# Patient Record
Sex: Female | Born: 1940 | Race: White | Hispanic: No | Smoking: Never smoker
Health system: Southern US, Community
[De-identification: ages and names within clinical notes are randomized; demographics above are authoritative.]

## PROBLEM LIST (undated history)

## (undated) DIAGNOSIS — Z8719 Personal history of other diseases of the digestive system: Secondary | ICD-10-CM

## (undated) DIAGNOSIS — R51 Headache: Secondary | ICD-10-CM

## (undated) DIAGNOSIS — Z950 Presence of cardiac pacemaker: Secondary | ICD-10-CM

## (undated) DIAGNOSIS — I639 Cerebral infarction, unspecified: Secondary | ICD-10-CM

## (undated) DIAGNOSIS — I809 Phlebitis and thrombophlebitis of unspecified site: Secondary | ICD-10-CM

## (undated) DIAGNOSIS — R0602 Shortness of breath: Secondary | ICD-10-CM

## (undated) DIAGNOSIS — R519 Headache, unspecified: Secondary | ICD-10-CM

## (undated) DIAGNOSIS — I499 Cardiac arrhythmia, unspecified: Secondary | ICD-10-CM

## (undated) DIAGNOSIS — I10 Essential (primary) hypertension: Secondary | ICD-10-CM

## (undated) DIAGNOSIS — R609 Edema, unspecified: Secondary | ICD-10-CM

## (undated) DIAGNOSIS — K589 Irritable bowel syndrome without diarrhea: Secondary | ICD-10-CM

## (undated) DIAGNOSIS — I272 Pulmonary hypertension, unspecified: Secondary | ICD-10-CM

## (undated) DIAGNOSIS — E119 Type 2 diabetes mellitus without complications: Secondary | ICD-10-CM

## (undated) DIAGNOSIS — K219 Gastro-esophageal reflux disease without esophagitis: Secondary | ICD-10-CM

## (undated) DIAGNOSIS — E039 Hypothyroidism, unspecified: Secondary | ICD-10-CM

## (undated) DIAGNOSIS — M1990 Unspecified osteoarthritis, unspecified site: Secondary | ICD-10-CM

## (undated) HISTORY — PX: TONSILLECTOMY: SUR1361

## (undated) HISTORY — PX: KNEE ARTHROSCOPY: SUR90

## (undated) HISTORY — PX: INSERT / REPLACE / REMOVE PACEMAKER: SUR710

## (undated) HISTORY — PX: EYE SURGERY: SHX253

---

## 1975-01-17 HISTORY — PX: ABDOMINAL HYSTERECTOMY: SHX81

## 1981-01-16 HISTORY — PX: CHOLECYSTECTOMY: SHX55

## 2012-10-23 ENCOUNTER — Encounter (INDEPENDENT_AMBULATORY_CARE_PROVIDER_SITE_OTHER): Payer: Medicare Other | Admitting: Ophthalmology

## 2012-10-23 DIAGNOSIS — H2510 Age-related nuclear cataract, unspecified eye: Secondary | ICD-10-CM

## 2012-10-23 DIAGNOSIS — H43819 Vitreous degeneration, unspecified eye: Secondary | ICD-10-CM

## 2012-10-23 DIAGNOSIS — E11319 Type 2 diabetes mellitus with unspecified diabetic retinopathy without macular edema: Secondary | ICD-10-CM

## 2012-10-23 DIAGNOSIS — E1139 Type 2 diabetes mellitus with other diabetic ophthalmic complication: Secondary | ICD-10-CM

## 2012-10-23 DIAGNOSIS — H35039 Hypertensive retinopathy, unspecified eye: Secondary | ICD-10-CM

## 2012-10-23 DIAGNOSIS — I10 Essential (primary) hypertension: Secondary | ICD-10-CM

## 2012-10-23 DIAGNOSIS — H35349 Macular cyst, hole, or pseudohole, unspecified eye: Secondary | ICD-10-CM

## 2012-10-23 DIAGNOSIS — H35342 Macular cyst, hole, or pseudohole, left eye: Secondary | ICD-10-CM | POA: Diagnosis present

## 2012-10-23 NOTE — H&P (Signed)
[name] is an 72 y.o. female.   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  No past surgical history on file.  No family history on file. Social History:  has no tobacco, alcohol, and drug history on file.  Allergies: Allergies not on file  No prescriptions prior to admission    Review of systems otherwise negative  There were no vitals taken for this visit.  Physical exam: Mental status: oriented x3. Eyes: See eye exam associated with this date of surgery in media tab.  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

## 2012-11-04 ENCOUNTER — Encounter (HOSPITAL_COMMUNITY): Payer: Self-pay | Admitting: Pharmacist

## 2012-11-05 ENCOUNTER — Encounter (HOSPITAL_COMMUNITY): Payer: Self-pay

## 2012-11-05 ENCOUNTER — Encounter (HOSPITAL_COMMUNITY)
Admission: RE | Admit: 2012-11-05 | Discharge: 2012-11-05 | Disposition: A | Discharge status: Home / Self Care | Payer: Medicare Other | Source: Ambulatory Visit | Attending: Ophthalmology | Admitting: Ophthalmology

## 2012-11-05 ENCOUNTER — Ambulatory Visit (HOSPITAL_COMMUNITY)
Admission: RE | Admit: 2012-11-05 | Discharge: 2012-11-05 | Disposition: A | Discharge status: Home / Self Care | Payer: Medicare Other | Source: Ambulatory Visit | Attending: Ophthalmology | Admitting: Ophthalmology

## 2012-11-05 DIAGNOSIS — Z01812 Encounter for preprocedural laboratory examination: Secondary | ICD-10-CM | POA: Insufficient documentation

## 2012-11-05 DIAGNOSIS — Z01810 Encounter for preprocedural cardiovascular examination: Secondary | ICD-10-CM | POA: Insufficient documentation

## 2012-11-05 DIAGNOSIS — R9431 Abnormal electrocardiogram [ECG] [EKG]: Secondary | ICD-10-CM | POA: Insufficient documentation

## 2012-11-05 DIAGNOSIS — Z01818 Encounter for other preprocedural examination: Secondary | ICD-10-CM | POA: Insufficient documentation

## 2012-11-05 HISTORY — DX: Personal history of other diseases of the digestive system: Z87.19

## 2012-11-05 HISTORY — DX: Hypothyroidism, unspecified: E03.9

## 2012-11-05 HISTORY — DX: Essential (primary) hypertension: I10

## 2012-11-05 HISTORY — DX: Cerebral infarction, unspecified: I63.9

## 2012-11-05 HISTORY — DX: Presence of cardiac pacemaker: Z95.0

## 2012-11-05 HISTORY — DX: Unspecified osteoarthritis, unspecified site: M19.90

## 2012-11-05 HISTORY — DX: Cardiac arrhythmia, unspecified: I49.9

## 2012-11-05 HISTORY — DX: Gastro-esophageal reflux disease without esophagitis: K21.9

## 2012-11-05 HISTORY — DX: Type 2 diabetes mellitus without complications: E11.9

## 2012-11-05 LAB — BASIC METABOLIC PANEL
BUN: 19 mg/dL (ref 6–23)
CO2: 23 mEq/L (ref 19–32)
Calcium: 8.9 mg/dL (ref 8.4–10.5)
Chloride: 105 mEq/L (ref 96–112)
Creatinine, Ser: 0.65 mg/dL (ref 0.50–1.10)
GFR calc Af Amer: >90 mL/min (ref >90)
Potassium: 4.1 mEq/L (ref 3.5–5.1)

## 2012-11-05 LAB — CBC
Hemoglobin: 13 g/dL (ref 12.0–15.0)
MCV: 96.3 fL (ref 78.0–100.0)
Platelets: 215 10*3/uL (ref 150–400)
RDW: 14 % (ref 11.5–15.5)
WBC: 6.5 10*3/uL (ref 4.0–10.5)

## 2012-11-05 LAB — APTT: aPTT: 34 seconds (ref 24–37)

## 2012-11-05 NOTE — Progress Notes (Signed)
FAXED PPM ORDERS FOR DR. MUNLEY TO SIGN.  WAITING FOR OFFICE TO FAX BACK.  ALLISON TO REVIEW LABS

## 2012-11-05 NOTE — Pre-Procedure Instructions (Signed)
[name]  11/05/2012   Your procedure is scheduled on:   Tuesday  11/12/12  Report to [facility]  2 * 3 at  AS INSTRUCTED BY DR. MATTHEWS  Call this number if you have problems the morning of surgery: [phone]   Remember:   Do not eat food or drink liquids after midnight.   Take these medicines the morning of surgery with A SIP OF WATER:   TYLENOL, AMLODIPINE(NORVASC) , ESTRACE, GABAPENTIN, LEVOTHYROXINE, METOPROLOL(TOPROLXL), OMEPRAZOLE, POTASSIUM (STOP ASPIRIN AND COUMADIN AS DIRECTED)  Do not wear jewelry, make-up or nail polish.  Do not wear lotions, powders, or perfumes. You may wear deodorant.  Do not shave 48 hours prior to surgery. Men may shave face and neck.  Do not bring valuables to the hospital.  [facility] is not responsible                  for any belongings or valuables.               Contacts, dentures or bridgework may not be worn into surgery.  Leave suitcase in the car. After surgery it may be brought to your room.  For patients admitted to the hospital, discharge time is determined by your                treatment team.               Patients discharged the day of surgery will not be allowed to drive  home.  Name and phone number of your driver:   Special Instructions: Shower using CHG 2 nights before surgery and the night before surgery.  If you shower the day of surgery use CHG.  Use special wash - you have one bottle of CHG for all showers.  You should use approximately 1/3 of the bottle for each shower.    Please read over the following fact sheets that you were given: Pain Booklet, Coughing and Deep Breathing and Surgical Site Infection Prevention

## 2012-11-05 NOTE — Progress Notes (Signed)
11/05/12 1106  OBSTRUCTIVE SLEEP APNEA  Have you ever been diagnosed with sleep apnea through a sleep study? No  Do you snore loudly (loud enough to be heard through closed doors)?  0  Do you often feel tired, fatigued, or sleepy during the daytime? 0  Has anyone observed you stop breathing during your sleep? 1 (GASP FOR BREATH, NOT REGULARELY)  Do you have, or are you being treated for high blood pressure? 1  BMI more than 35 kg/m2? 1  Age over 21 years old? 1  Neck circumference greater than 40 cm/18 inches? 0  Gender: 0  Obstructive Sleep Apnea Score 4  Score 4 or greater  Results sent to PCP

## 2012-11-06 ENCOUNTER — Encounter (HOSPITAL_COMMUNITY): Payer: Self-pay

## 2012-11-06 NOTE — Progress Notes (Addendum)
Anesthesia Chart Review:  Patient is a 72 year old female scheduled for para plana vitrectomy, laser membrane peel, serum patch, gas injection, left eye on 11/12/12 by Dr. Ashley Royalty.  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 PCP is Dr. Philemon Kingdom.  Cardiologist is Dr. Norman Herrlich.  EP cardiologist is Dr. Rudolpho Sevin.    There is a signed clearance note from Dr. Dulce Sellar. His notes indicate that she is pacemaker dependent.  Hospital records are still pending, but no echo or stress within the last three years received from [facility].  Completed perioperative device form is still pending.    EKG on 11/05/12 showed a V-paced rhythm.  CXR on 11/05/12 showed no acute abnormality noted.  Preoperative labs noted. Her last dose of Coumadin will be on 11/06/12.  Plan to repeat PT/INR on the day of surgery.  She will also need a serum patch.  If no acute changes then I would anticipate that she could proceed as planned.  Velna Ochs [facility]/Anesthesiology Phone [phone] 11/06/2012 4:37 PM  Addendum: 11/07/2012 4:30 PM No recent stress or echo at [facility].  Her last nuclear stress test at [facility] was on 11/11/04 and showed no myocardial ischemia or scar, EF 79%.

## 2012-11-11 MED ORDER — GATIFLOXACIN 0.5 % OP SOLN: 1.0000 [drp] | OPHTHALMIC | Status: DC | PRN

## 2012-11-11 MED ORDER — TROPICAMIDE 1 % OP SOLN: 1.0000 [drp] | OPHTHALMIC | Status: DC | PRN

## 2012-11-11 MED ORDER — CYCLOPENTOLATE HCL 1 % OP SOLN
1.0000 [drp] | OPHTHALMIC | Status: DC | PRN
Start: 2012-11-11 — End: 2012-11-12

## 2012-11-11 MED ORDER — CEFAZOLIN SODIUM-DEXTROSE 2-3 GM-% IV SOLR
2.0000 g | INTRAVENOUS | Status: AC
  Administered 2012-11-12: 2 g via INTRAVENOUS

## 2012-11-11 MED ORDER — PHENYLEPHRINE HCL 2.5 % OP SOLN: 1.0000 [drp] | OPHTHALMIC | Status: DC | PRN

## 2012-11-11 NOTE — Progress Notes (Signed)
Received from [facility], Encounter date 10/25/2012.. **please read their --"plan"... We also made another attempt today to get a copy of the PPM form sent to them, still nothing in [employer].   Da

## 2012-11-12 ENCOUNTER — Encounter (HOSPITAL_COMMUNITY): Payer: Medicare Other | Admitting: Vascular Surgery

## 2012-11-12 ENCOUNTER — Encounter (HOSPITAL_COMMUNITY): Payer: Self-pay | Admitting: Certified Registered Nurse Anesthetist

## 2012-11-12 ENCOUNTER — Ambulatory Visit (HOSPITAL_COMMUNITY): Payer: Medicare Other | Admitting: Anesthesiology

## 2012-11-12 ENCOUNTER — Observation Stay (HOSPITAL_COMMUNITY)
Admission: RE | Admit: 2012-11-12 | Discharge: 2012-11-13 | Disposition: A | Discharge status: Home / Self Care | Payer: Medicare Other | Source: Ambulatory Visit | Attending: Ophthalmology | Admitting: Ophthalmology

## 2012-11-12 ENCOUNTER — Encounter (HOSPITAL_COMMUNITY)
Admission: RE | Disposition: A | Discharge status: Home / Self Care | Payer: Self-pay | Source: Ambulatory Visit | Attending: Ophthalmology

## 2012-11-12 DIAGNOSIS — H35349 Macular cyst, hole, or pseudohole, unspecified eye: Principal | ICD-10-CM | POA: Insufficient documentation

## 2012-11-12 DIAGNOSIS — H519 Unspecified disorder of binocular movement: Secondary | ICD-10-CM | POA: Insufficient documentation

## 2012-11-12 DIAGNOSIS — H35342 Macular cyst, hole, or pseudohole, left eye: Secondary | ICD-10-CM | POA: Diagnosis present

## 2012-11-12 DIAGNOSIS — Z950 Presence of cardiac pacemaker: Secondary | ICD-10-CM | POA: Insufficient documentation

## 2012-11-12 DIAGNOSIS — I10 Essential (primary) hypertension: Secondary | ICD-10-CM | POA: Insufficient documentation

## 2012-11-12 DIAGNOSIS — E119 Type 2 diabetes mellitus without complications: Secondary | ICD-10-CM | POA: Insufficient documentation

## 2012-11-12 DIAGNOSIS — I4891 Unspecified atrial fibrillation: Secondary | ICD-10-CM | POA: Insufficient documentation

## 2012-11-12 DIAGNOSIS — K449 Diaphragmatic hernia without obstruction or gangrene: Secondary | ICD-10-CM | POA: Insufficient documentation

## 2012-11-12 DIAGNOSIS — K219 Gastro-esophageal reflux disease without esophagitis: Secondary | ICD-10-CM | POA: Insufficient documentation

## 2012-11-12 DIAGNOSIS — Z8673 Personal history of transient ischemic attack (TIA), and cerebral infarction without residual deficits: Secondary | ICD-10-CM | POA: Insufficient documentation

## 2012-11-12 HISTORY — PX: 25 GAUGE PARS PLANA VITRECTOMY WITH 20 GAUGE MVR PORT FOR MACULAR HOLE: SHX6096

## 2012-11-12 HISTORY — PX: GAS INSERTION: SHX5336

## 2012-11-12 HISTORY — PX: PARS PLANA VITRECTOMY: SHX2166

## 2012-11-12 HISTORY — PX: MEMBRANE PEEL: SHX5967

## 2012-11-12 HISTORY — PX: PHOTOCOAGULATION WITH LASER: SHX6027

## 2012-11-12 HISTORY — PX: SERUM PATCH: SHX6091

## 2012-11-12 LAB — GLUCOSE, CAPILLARY
Glucose-Capillary: 86 mg/dL (ref 70–99)
Glucose-Capillary: 115 mg/dL — ABNORMAL HIGH (ref 70–99)
Glucose-Capillary: 172 mg/dL — ABNORMAL HIGH (ref 70–99)
Glucose-Capillary: 175 mg/dL — ABNORMAL HIGH (ref 70–99)

## 2012-11-12 LAB — AUTOLOGOUS SERUM PATCH PREP

## 2012-11-12 LAB — PROTIME-INR: Prothrombin Time: 13.9 seconds (ref 11.6–15.2)

## 2012-11-12 SURGERY — 25 GAUGE PARS PLANA VITRECTOMY WITH 20 GAUGE MVR PORT FOR MACULAR HOLE
Anesthesia: General | Site: Eye | Laterality: Left | Wound class: Clean

## 2012-11-12 MED ORDER — BSS IO SOLN
INTRAOCULAR | Status: AC
  Filled 2012-11-12: qty 15

## 2012-11-12 MED ORDER — SODIUM CHLORIDE 0.9 % IJ SOLN
INTRAMUSCULAR | Status: DC | PRN
  Administered 2012-11-12: 11:00:00

## 2012-11-12 MED ORDER — TROPICAMIDE 1 % OP SOLN
1.0000 [drp] | OPHTHALMIC | Status: AC | PRN
  Administered 2012-11-12 (×3): 1 [drp] via OPHTHALMIC
  Filled 2012-11-12: qty 3

## 2012-11-12 MED ORDER — METFORMIN HCL 500 MG PO TABS
500.0000 mg | ORAL_TABLET | Freq: Every day | ORAL | Status: DC
  Administered 2012-11-13: 500 mg via ORAL
  Filled 2012-11-12 (×2): qty 1

## 2012-11-12 MED ORDER — SODIUM CHLORIDE 0.9 % IJ SOLN
INTRAMUSCULAR | Status: AC
  Filled 2012-11-12: qty 10

## 2012-11-12 MED ORDER — GLIPIZIDE ER 5 MG PO TB24
5.0000 mg | ORAL_TABLET | Freq: Every day | ORAL | Status: DC
  Filled 2012-11-12 (×2): qty 1

## 2012-11-12 MED ORDER — DEXAMETHASONE SODIUM PHOSPHATE 10 MG/ML IJ SOLN
INTRAMUSCULAR | Status: AC
  Filled 2012-11-12: qty 1

## 2012-11-12 MED ORDER — WARFARIN - PHARMACIST DOSING INPATIENT: Freq: Every day | Status: DC

## 2012-11-12 MED ORDER — LACTATED RINGERS IV SOLN: INTRAVENOUS | Status: DC

## 2012-11-12 MED ORDER — GABAPENTIN 300 MG PO CAPS
600.0000 mg | ORAL_CAPSULE | Freq: Every day | ORAL | Status: DC
  Administered 2012-11-12: 600 mg via ORAL
  Filled 2012-11-12 (×2): qty 2

## 2012-11-12 MED ORDER — BUPIVACAINE HCL (PF) 0.75 % IJ SOLN
INTRAMUSCULAR | Status: DC | PRN
  Administered 2012-11-12: 10 mL

## 2012-11-12 MED ORDER — BUPIVACAINE-EPINEPHRINE PF 0.25-1:200000 % IJ SOLN
INTRAMUSCULAR | Status: AC
  Filled 2012-11-12: qty 30

## 2012-11-12 MED ORDER — MIDAZOLAM HCL 5 MG/5ML IJ SOLN
INTRAMUSCULAR | Status: DC | PRN
  Administered 2012-11-12: 1 mg via INTRAVENOUS

## 2012-11-12 MED ORDER — MORPHINE SULFATE 2 MG/ML IJ SOLN: 1.0000 mg | INTRAMUSCULAR | Status: DC | PRN

## 2012-11-12 MED ORDER — FENTANYL CITRATE 0.05 MG/ML IJ SOLN
INTRAMUSCULAR | Status: DC | PRN
  Administered 2012-11-12: 100 ug via INTRAVENOUS

## 2012-11-12 MED ORDER — TRIAMCINOLONE ACETONIDE 40 MG/ML IJ SUSP
INTRAMUSCULAR | Status: AC
  Filled 2012-11-12: qty 5

## 2012-11-12 MED ORDER — PREDNISOLONE ACETATE 1 % OP SUSP
1.0000 [drp] | Freq: Four times a day (QID) | OPHTHALMIC | Status: DC
  Filled 2012-11-12: qty 1
  Filled 2012-11-12: qty 5

## 2012-11-12 MED ORDER — ONDANSETRON HCL 4 MG/2ML IJ SOLN: 4.0000 mg | Freq: Four times a day (QID) | INTRAMUSCULAR | Status: DC | PRN

## 2012-11-12 MED ORDER — SODIUM CHLORIDE 0.45 % IV SOLN
INTRAVENOUS | Status: DC
  Administered 2012-11-12: 20 mL via INTRAVENOUS

## 2012-11-12 MED ORDER — WARFARIN SODIUM 6 MG PO TABS
6.0000 mg | ORAL_TABLET | Freq: Once | ORAL | Status: AC
  Administered 2012-11-12: 6 mg via ORAL
  Filled 2012-11-12: qty 1

## 2012-11-12 MED ORDER — DEXAMETHASONE SODIUM PHOSPHATE 10 MG/ML IJ SOLN
INTRAMUSCULAR | Status: DC | PRN
  Administered 2012-11-12: 10 mg

## 2012-11-12 MED ORDER — AMLODIPINE BESYLATE 10 MG PO TABS
10.0000 mg | ORAL_TABLET | Freq: Every day | ORAL | Status: DC
  Administered 2012-11-12: 10 mg via ORAL
  Filled 2012-11-12 (×2): qty 1

## 2012-11-12 MED ORDER — LIDOCAINE HCL (CARDIAC) 20 MG/ML IV SOLN
INTRAVENOUS | Status: DC | PRN
  Administered 2012-11-12: 50 mg via INTRAVENOUS

## 2012-11-12 MED ORDER — ATROPINE SULFATE 1 % OP SOLN
OPHTHALMIC | Status: AC
  Filled 2012-11-12: qty 2

## 2012-11-12 MED ORDER — BRIMONIDINE TARTRATE 0.2 % OP SOLN
1.0000 [drp] | Freq: Two times a day (BID) | OPHTHALMIC | Status: DC
  Filled 2012-11-12: qty 5

## 2012-11-12 MED ORDER — TRIAMCINOLONE ACETONIDE 40 MG/ML IJ SUSP
INTRAMUSCULAR | Status: DC | PRN
  Administered 2012-11-12: .2 mL

## 2012-11-12 MED ORDER — SODIUM HYALURONATE 10 MG/ML IO SOLN
INTRAOCULAR | Status: AC
  Filled 2012-11-12: qty 0.85

## 2012-11-12 MED ORDER — INSULIN ASPART 100 UNIT/ML ~~LOC~~ SOLN: 0.0000 [IU] | Freq: Every day | SUBCUTANEOUS | Status: DC

## 2012-11-12 MED ORDER — ATROPINE SULFATE 1 % OP SOLN
OPHTHALMIC | Status: DC | PRN
  Administered 2012-11-12: 2 [drp] via OPHTHALMIC

## 2012-11-12 MED ORDER — ACETAZOLAMIDE SODIUM 500 MG IJ SOLR
500.0000 mg | Freq: Once | INTRAMUSCULAR | Status: AC
  Administered 2012-11-13: 500 mg via INTRAVENOUS
  Filled 2012-11-12: qty 500

## 2012-11-12 MED ORDER — POLYMYXIN B SULFATE 500000 UNITS IJ SOLR
INTRAMUSCULAR | Status: AC
  Filled 2012-11-12: qty 1

## 2012-11-12 MED ORDER — EPINEPHRINE HCL 1 MG/ML IJ SOLN
INTRAMUSCULAR | Status: AC
  Filled 2012-11-12: qty 1

## 2012-11-12 MED ORDER — METOPROLOL SUCCINATE ER 50 MG PO TB24
50.0000 mg | ORAL_TABLET | Freq: Every day | ORAL | Status: DC
  Administered 2012-11-12: 50 mg via ORAL
  Filled 2012-11-12: qty 2
  Filled 2012-11-12: qty 1

## 2012-11-12 MED ORDER — SIMVASTATIN 5 MG PO TABS
5.0000 mg | ORAL_TABLET | Freq: Every day | ORAL | Status: DC
  Administered 2012-11-12: 5 mg via ORAL
  Filled 2012-11-12 (×2): qty 1

## 2012-11-12 MED ORDER — SODIUM CHLORIDE 0.9 % IV SOLN
INTRAVENOUS | Status: DC
  Administered 2012-11-12 (×2): via INTRAVENOUS

## 2012-11-12 MED ORDER — BACITRACIN-POLYMYXIN B 500-10000 UNIT/GM OP OINT
1.0000 "application " | TOPICAL_OINTMENT | Freq: Four times a day (QID) | OPHTHALMIC | Status: DC
  Filled 2012-11-12: qty 3.5

## 2012-11-12 MED ORDER — MAGNESIUM HYDROXIDE 400 MG/5ML PO SUSP: 15.0000 mL | Freq: Four times a day (QID) | ORAL | Status: DC | PRN

## 2012-11-12 MED ORDER — GATIFLOXACIN 0.5 % OP SOLN
1.0000 [drp] | Freq: Four times a day (QID) | OPHTHALMIC | Status: DC
  Filled 2012-11-12: qty 2.5

## 2012-11-12 MED ORDER — PHENYLEPHRINE HCL 2.5 % OP SOLN
1.0000 [drp] | OPHTHALMIC | Status: AC | PRN
  Administered 2012-11-12 (×3): 1 [drp] via OPHTHALMIC
  Filled 2012-11-12: qty 2

## 2012-11-12 MED ORDER — PROPOFOL 10 MG/ML IV BOLUS
INTRAVENOUS | Status: DC | PRN
  Administered 2012-11-12: 120 mg via INTRAVENOUS

## 2012-11-12 MED ORDER — FENTANYL CITRATE 0.05 MG/ML IJ SOLN: 25.0000 ug | INTRAMUSCULAR | Status: DC | PRN

## 2012-11-12 MED ORDER — POTASSIUM CHLORIDE CRYS ER 10 MEQ PO TBCR
10.0000 meq | EXTENDED_RELEASE_TABLET | Freq: Two times a day (BID) | ORAL | Status: DC
  Administered 2012-11-12 (×2): 10 meq via ORAL
  Filled 2012-11-12 (×4): qty 1

## 2012-11-12 MED ORDER — SODIUM HYALURONATE 10 MG/ML IO SOLN
INTRAOCULAR | Status: DC | PRN
  Administered 2012-11-12: 0.85 mL via INTRAOCULAR

## 2012-11-12 MED ORDER — BACITRACIN-POLYMYXIN B 500-10000 UNIT/GM OP OINT
TOPICAL_OINTMENT | OPHTHALMIC | Status: AC
  Filled 2012-11-12: qty 3.5

## 2012-11-12 MED ORDER — DOCUSATE SODIUM 100 MG PO CAPS
100.0000 mg | ORAL_CAPSULE | Freq: Two times a day (BID) | ORAL | Status: DC
  Administered 2012-11-12 (×2): 100 mg via ORAL
  Filled 2012-11-12 (×2): qty 1

## 2012-11-12 MED ORDER — EPINEPHRINE HCL 1 MG/ML IJ SOLN
INTRAOCULAR | Status: DC | PRN
  Administered 2012-11-12: 11:00:00

## 2012-11-12 MED ORDER — INSULIN ASPART 100 UNIT/ML ~~LOC~~ SOLN
0.0000 [IU] | Freq: Three times a day (TID) | SUBCUTANEOUS | Status: DC
  Administered 2012-11-12 – 2012-11-13 (×2): 3 [IU] via SUBCUTANEOUS

## 2012-11-12 MED ORDER — ASPIRIN EC 81 MG PO TBEC
81.0000 mg | DELAYED_RELEASE_TABLET | Freq: Every day | ORAL | Status: DC
  Administered 2012-11-12: 81 mg via ORAL
  Filled 2012-11-12 (×2): qty 1

## 2012-11-12 MED ORDER — BENAZEPRIL HCL 40 MG PO TABS
40.0000 mg | ORAL_TABLET | Freq: Every day | ORAL | Status: DC
  Administered 2012-11-12: 40 mg via ORAL
  Filled 2012-11-12 (×2): qty 1

## 2012-11-12 MED ORDER — LACTATED RINGERS IV SOLN: INTRAVENOUS | Status: DC | PRN

## 2012-11-12 MED ORDER — TETRACAINE HCL 0.5 % OP SOLN
2.0000 [drp] | Freq: Once | OPHTHALMIC | Status: DC
  Filled 2012-11-12: qty 2

## 2012-11-12 MED ORDER — GATIFLOXACIN 0.5 % OP SOLN
1.0000 [drp] | OPHTHALMIC | Status: AC | PRN
  Administered 2012-11-12 (×3): 1 [drp] via OPHTHALMIC
  Filled 2012-11-12: qty 2.5

## 2012-11-12 MED ORDER — HYALURONIDASE HUMAN 150 UNIT/ML IJ SOLN
INTRAMUSCULAR | Status: AC
  Filled 2012-11-12: qty 1

## 2012-11-12 MED ORDER — BSS PLUS IO SOLN
INTRAOCULAR | Status: AC
  Filled 2012-11-12: qty 500

## 2012-11-12 MED ORDER — ZOLPIDEM TARTRATE 5 MG PO TABS: 5.0000 mg | ORAL_TABLET | Freq: Every evening | ORAL | Status: DC | PRN

## 2012-11-12 MED ORDER — HYDROCODONE-ACETAMINOPHEN 5-325 MG PO TABS: 1.0000 | ORAL_TABLET | ORAL | Status: DC | PRN

## 2012-11-12 MED ORDER — HEMOSTATIC AGENTS (NO CHARGE) OPTIME
TOPICAL | Status: DC | PRN
  Administered 2012-11-12: 1 via TOPICAL

## 2012-11-12 MED ORDER — LATANOPROST 0.005 % OP SOLN
1.0000 [drp] | Freq: Every day | OPHTHALMIC | Status: DC
  Filled 2012-11-12: qty 2.5

## 2012-11-12 MED ORDER — BACITRACIN-POLYMYXIN B 500-10000 UNIT/GM OP OINT
TOPICAL_OINTMENT | OPHTHALMIC | Status: DC | PRN
  Administered 2012-11-12: 1 via OPHTHALMIC

## 2012-11-12 MED ORDER — HYPROMELLOSE (GONIOSCOPIC) 2.5 % OP SOLN
OPHTHALMIC | Status: AC
  Filled 2012-11-12: qty 15

## 2012-11-12 MED ORDER — TEMAZEPAM 15 MG PO CAPS: 15.0000 mg | ORAL_CAPSULE | Freq: Every evening | ORAL | Status: DC | PRN

## 2012-11-12 MED ORDER — ACETAZOLAMIDE SODIUM 500 MG IJ SOLR
INTRAMUSCULAR | Status: AC
  Filled 2012-11-12: qty 500

## 2012-11-12 MED ORDER — BUPIVACAINE HCL (PF) 0.75 % IJ SOLN
INTRAMUSCULAR | Status: AC
  Filled 2012-11-12: qty 10

## 2012-11-12 MED ORDER — CYCLOPENTOLATE HCL 1 % OP SOLN
1.0000 [drp] | OPHTHALMIC | Status: AC | PRN
  Administered 2012-11-12 (×3): 1 [drp] via OPHTHALMIC
  Filled 2012-11-12: qty 2

## 2012-11-12 MED ORDER — ACETAMINOPHEN 325 MG PO TABS: 325.0000 mg | ORAL_TABLET | ORAL | Status: DC | PRN

## 2012-11-12 MED ORDER — LIDOCAINE HCL 2 % IJ SOLN
INTRAMUSCULAR | Status: AC
  Filled 2012-11-12: qty 20

## 2012-11-12 MED ORDER — WARFARIN SODIUM 3 MG PO TABS: 3.0000 mg | ORAL_TABLET | Freq: Every day | ORAL | Status: DC

## 2012-11-12 MED ORDER — CEFAZOLIN SODIUM-DEXTROSE 2-3 GM-% IV SOLR
INTRAVENOUS | Status: AC
  Filled 2012-11-12: qty 50

## 2012-11-12 MED ORDER — MINERAL OIL LIGHT 100 % EX OIL
TOPICAL_OIL | CUTANEOUS | Status: AC
  Filled 2012-11-12: qty 25

## 2012-11-12 MED ORDER — LEVOTHYROXINE SODIUM 100 MCG PO TABS
100.0000 ug | ORAL_TABLET | Freq: Every day | ORAL | Status: DC
  Administered 2012-11-13: 100 ug via ORAL
  Filled 2012-11-12 (×2): qty 1

## 2012-11-12 MED ORDER — GENTAMICIN SULFATE 40 MG/ML IJ SOLN
INTRAMUSCULAR | Status: AC
  Filled 2012-11-12: qty 2

## 2012-11-12 MED ORDER — PANTOPRAZOLE SODIUM 40 MG PO TBEC
40.0000 mg | DELAYED_RELEASE_TABLET | Freq: Every day | ORAL | Status: DC
  Administered 2012-11-12: 40 mg via ORAL
  Filled 2012-11-12: qty 1

## 2012-11-12 SURGICAL SUPPLY — 82 items
APL SRG 3 HI ABS STRL LF PLS (MISCELLANEOUS)
APPLICATOR DR MATTHEWS STRL (MISCELLANEOUS) IMPLANT
BALL CTTN LRG ABS STRL LF (GAUZE/BANDAGES/DRESSINGS) ×3
BLADE EYE CATARACT 19 1.4 BEAV (BLADE) IMPLANT
BLADE MVR KNIFE 19G (BLADE) IMPLANT
BLADE MVR KNIFE 20G (BLADE) ×2 IMPLANT
CANNULA ANT CHAM MAIN (OPHTHALMIC RELATED) IMPLANT
CANNULA VLV SOFT TIP 25G (OPHTHALMIC) ×1 IMPLANT
CANNULA VLV SOFT TIP 25GA (OPHTHALMIC) ×2 IMPLANT
CLOTH BEACON ORANGE TIMEOUT ST (SAFETY) ×1 IMPLANT
CORDS BIPOLAR (ELECTRODE) ×2 IMPLANT
COTTONBALL LRG STERILE PKG (GAUZE/BANDAGES/DRESSINGS) ×6 IMPLANT
COVER MAYO STAND STRL (DRAPES) ×2 IMPLANT
DRAPE INCISE 51X51 W/FILM STRL (DRAPES) ×1 IMPLANT
DRAPE OPHTHALMIC 77X100 STRL (CUSTOM PROCEDURE TRAY) ×2 IMPLANT
EAGLE VIT/RET MICRO PIC 168 25 (MISCELLANEOUS) IMPLANT
ERASER HMR WETFIELD 23G BP (MISCELLANEOUS) IMPLANT
FILTER BLUE MILLIPORE (MISCELLANEOUS) IMPLANT
FILTER STRAW FLUID ASPIR (MISCELLANEOUS) ×2 IMPLANT
GAS AUTO FILL CONSTEL (OPHTHALMIC) ×2
GAS AUTO FILL CONSTELLATION (OPHTHALMIC) IMPLANT
GAS IO PFP 125GM ISPAN CNSTL (MEDICAL GASES) IMPLANT
GAS TANK CONSTELL (MEDICAL GASES) ×2
GLOVE SS BIOGEL STRL SZ 6.5 (GLOVE) ×1 IMPLANT
GLOVE SS BIOGEL STRL SZ 7 (GLOVE) ×1 IMPLANT
GLOVE SUPERSENSE BIOGEL SZ 6.5 (GLOVE) ×1
GLOVE SUPERSENSE BIOGEL SZ 7 (GLOVE) ×1
GLOVE SURG 8.5 LATEX PF (GLOVE) ×2 IMPLANT
GLOVE SURG SS PI 6.5 STRL IVOR (GLOVE) ×1 IMPLANT
GLOVE SURG SS PI 7.0 STRL IVOR (GLOVE) ×2 IMPLANT
GOWN STRL NON-REIN LRG LVL3 (GOWN DISPOSABLE) ×8 IMPLANT
HANDLE PNEUMATIC FOR CONSTEL (OPHTHALMIC) IMPLANT
KIT BASIN OR (CUSTOM PROCEDURE TRAY) ×2 IMPLANT
KIT ROOM TURNOVER OR (KITS) ×2 IMPLANT
KNIFE CRESCENT 1.75 EDGEAHEAD (BLADE) IMPLANT
KNIFE GRIESHABER SHARP 2.5MM (MISCELLANEOUS) IMPLANT
LENS BIOM SUPER VIEW SET DISP (OPHTHALMIC RELATED) ×1 IMPLANT
LOOP FINESSE 25 GA (MISCELLANEOUS) ×1 IMPLANT
MASK EYE SHIELD (GAUZE/BANDAGES/DRESSINGS) ×1 IMPLANT
MICROPICK 25G (MISCELLANEOUS)
NDL 18GX1X1/2 (RX/OR ONLY) (NEEDLE) ×1 IMPLANT
NDL 25GX 5/8IN NON SAFETY (NEEDLE) ×1 IMPLANT
NDL FILTER BLUNT 18X1 1/2 (NEEDLE) IMPLANT
NDL HYPO 30X.5 LL (NEEDLE) ×3 IMPLANT
NEEDLE 18GX1X1/2 (RX/OR ONLY) (NEEDLE) ×2 IMPLANT
NEEDLE 25GX 5/8IN NON SAFETY (NEEDLE) ×2 IMPLANT
NEEDLE 27GAX1X1/2 (NEEDLE) IMPLANT
NEEDLE FILTER BLUNT 18X 1/2SAF (NEEDLE) ×1
NEEDLE FILTER BLUNT 18X1 1/2 (NEEDLE) ×1 IMPLANT
NEEDLE HYPO 30X.5 LL (NEEDLE) ×8 IMPLANT
NS IRRIG 1000ML POUR BTL (IV SOLUTION) ×2 IMPLANT
PACK FRAGMATOME (OPHTHALMIC) IMPLANT
PACK VITRECTOMY CUSTOM (CUSTOM PROCEDURE TRAY) ×2 IMPLANT
PAD ARMBOARD 7.5X6 YLW CONV (MISCELLANEOUS) ×4 IMPLANT
PAD EYE OVAL STERILE LF (GAUZE/BANDAGES/DRESSINGS) ×1 IMPLANT
PAK PIK VITRECTOMY CVS 25GA (OPHTHALMIC) ×2 IMPLANT
PIC ILLUMINATED 25G (OPHTHALMIC) ×2
PICK MICROPICK 25G (MISCELLANEOUS) IMPLANT
PIK ILLUMINATED 25G (OPHTHALMIC) ×1 IMPLANT
PROBE LASER ILLUM FLEX CVD 25G (OPHTHALMIC) IMPLANT
REPL STRA BRUSH NDL (NEEDLE) ×1 IMPLANT
REPL STRA BRUSH NEEDLE (NEEDLE) ×2 IMPLANT
RESERVOIR BACK FLUSH (MISCELLANEOUS) ×2 IMPLANT
ROLLS DENTAL (MISCELLANEOUS) ×4 IMPLANT
SCRAPER DIAMOND 25GA (OPHTHALMIC RELATED) IMPLANT
SCRAPER DIAMOND DUST MEMBRANE (MISCELLANEOUS) ×2 IMPLANT
SPONGE SURGIFOAM ABS GEL 12-7 (HEMOSTASIS) ×2 IMPLANT
STOPCOCK 4 WAY LG BORE MALE ST (IV SETS) ×1 IMPLANT
SUT CHROMIC 7 0 TG140 8 (SUTURE) ×1 IMPLANT
SUT ETHILON 9 0 TG140 8 (SUTURE) ×2 IMPLANT
SUT POLY NON ABSORB 10-0 8 STR (SUTURE) IMPLANT
SUT SILK 4 0 RB 1 (SUTURE) IMPLANT
SYR 20CC LL (SYRINGE) ×2 IMPLANT
SYR 5ML LL (SYRINGE) IMPLANT
SYR BULB 3OZ (MISCELLANEOUS) ×2 IMPLANT
SYR TB 1ML LUER SLIP (SYRINGE) ×3 IMPLANT
SYRINGE 10CC LL (SYRINGE) ×1 IMPLANT
TAPE SURG TRANSPORE 1 IN (GAUZE/BANDAGES/DRESSINGS) IMPLANT
TAPE SURGICAL TRANSPORE 1 IN (GAUZE/BANDAGES/DRESSINGS) ×1
TOWEL OR 17X24 6PK STRL BLUE (TOWEL DISPOSABLE) ×5 IMPLANT
WATER STERILE IRR 1000ML POUR (IV SOLUTION) ×2 IMPLANT
WIPE INSTRUMENT VISIWIPE 73X73 (MISCELLANEOUS) ×2 IMPLANT

## 2012-11-12 NOTE — Brief Op Note (Signed)
Brief Operative note   Preoperative diagnosis:  Pre-Op Diagnosis Codes:    * Macular cyst, hole, or pseudohole of retina [362.54] Postoperative diagnosis  Post-Op Diagnosis Codes:    * Macular cyst, hole, or pseudohole of retina [362.54]  Procedures: Repair of macular hole with pars plana vitrectomy, membrane peel, laser, serum patch, gas fluid exchange left eye  Surgeon:  Sherrie George, MD...  Assistant:  Rosalie Doctor SA    Anesthesia: General  Specimen: none  Estimated blood loss:  1cc  Complications: none  Patient sent to PACU in good condition  Composed by Sherrie George MD  Dictation number: [phone]

## 2012-11-12 NOTE — Preoperative (Signed)
Beta Blockers   Reason not to administer Beta Blockers:Not Applicable 

## 2012-11-12 NOTE — Anesthesia Preprocedure Evaluation (Addendum)
Anesthesia Evaluation  Patient identified by MRN, date of birth, ID band Patient awake    Reviewed: Allergy & Precautions, H&P , NPO status , Patient's Chart, lab work & pertinent test results, reviewed documented beta blocker date and time   Airway Mallampati: II TM Distance: >3 FB Neck ROM: full    Dental  (+) Edentulous Upper, Edentulous Lower and Dental Advisory Given   Pulmonary neg pulmonary ROS,  breath sounds clear to auscultation  Pulmonary exam normal       Cardiovascular Exercise Tolerance: Good hypertension, Pt. on medications and Pt. on home beta blockers + dysrhythmias Atrial Fibrillation + pacemaker Rhythm:regular Rate:Normal     Neuro/Psych CVA 1998 CVA negative psych ROS   GI/Hepatic negative GI ROS, Neg liver ROS, hiatal hernia, GERD-  Medicated and Controlled,  Endo/Other  diabetes, Well Controlled, Type 2, Oral Hypoglycemic AgentsHypothyroidism   Renal/GU negative Renal ROS  negative genitourinary   Musculoskeletal   Abdominal   Peds  Hematology negative hematology ROS (+)   Anesthesia Other Findings   Reproductive/Obstetrics negative OB ROS                          Anesthesia Physical Anesthesia Plan  ASA: III  Anesthesia Plan: General   Post-op Pain Management:    Induction: Intravenous  Airway Management Planned: Oral ETT  Additional Equipment:   Intra-op Plan:   Post-operative Plan: Extubation in OR  Informed Consent: I have reviewed the patients History and Physical, chart, labs and discussed the procedure including the risks, benefits and alternatives for the proposed anesthesia with the patient or authorized representative who has indicated his/her understanding and acceptance.   Dental Advisory Given  Plan Discussed with: CRNA and Surgeon  Anesthesia Plan Comments:         Anesthesia Quick Evaluation

## 2012-11-12 NOTE — Progress Notes (Signed)
Report given to Lauren RN.

## 2012-11-12 NOTE — Anesthesia Postprocedure Evaluation (Signed)
  Anesthesia Post-op Note  Patient: [name]  Procedure(s) Performed: Procedure(s) with comments: 25 GAUGE PARS PLANA VITRECTOMY WITH 20 GAUGE MVR PORT FOR MACULAR HOLE (Left) MEMBRANE PEEL (Left) SERUM PATCH (Left) INSERTION OF GAS (Left) - C3F8 (expires 11/2014) HEADSCOPE LASER (Left)  Patient Location: PACU  Anesthesia Type:General  Level of Consciousness: awake and alert   Airway and Oxygen Therapy: Patient Spontanous Breathing  Post-op Pain: none  Post-op Assessment: Post-op Vital signs reviewed, Patient's Cardiovascular Status Stable and Respiratory Function Stable  Post-op Vital Signs: Reviewed  Filed Vitals:   11/12/12 1400  BP:   Pulse:   Temp: 36.7 C  Resp:     Complications: No apparent anesthesia complications

## 2012-11-12 NOTE — Progress Notes (Signed)
ANTICOAGULATION CONSULT NOTE - Initial Consult  Pharmacy Consult for Coumadin Indication: atrial fibrillation  No Known Allergies  Patient Measurements: Height: 5' 1.81" (157 cm) Weight: 236 lb 12.4 oz (107.4 kg) IBW/kg (Calculated) : 49.67 Heparin Dosing Weight:   Vital Signs: Temp: 98.6 F (37 C) (10/28 1500) Temp src: Oral (10/28 1500) BP: 130/43 mmHg (10/28 1400) Pulse Rate: 76 (10/28 1500)  Labs:  Recent Labs  11/12/12 0939  LABPROT 13.9  INR 1.09    Estimated Creatinine Clearance: 74.1 ml/min (by C-G formula based on Cr of 0.65).   Medical History: Past Medical History  Diagnosis Date  . Hypertension   . Hypothyroidism   . Diabetes mellitus without complication   . Pacemaker   . Stroke     1998  . GERD (gastroesophageal reflux disease)   . H/O hiatal hernia   . Arthritis     SPURS, DDD, NECK  . Dysrhythmia     afib ([facility], Dr. Dulce Sellar)    Medications:  Scheduled:  . [START ON 11/13/2012] acetaZOLAMIDE  500 mg Intravenous Once  . amLODipine  10 mg Oral Daily  . aspirin EC  81 mg Oral Daily  . [START ON 11/13/2012] bacitracin-polymyxin b  1 application Left Eye QID  . benazepril  40 mg Oral Daily  . [START ON 11/13/2012] brimonidine  1 drop Left Eye BID  . docusate sodium  100 mg Oral BID  . gabapentin  600 mg Oral QHS  . [START ON 11/13/2012] gatifloxacin  1 drop Left Eye QID  . glipiZIDE  5 mg Oral Daily  . [START ON 11/13/2012] latanoprost  1 drop Left Eye QHS  . [START ON 11/13/2012] levothyroxine  100 mcg Oral QAC breakfast  . [START ON 11/13/2012] metFORMIN  500 mg Oral Q breakfast  . metoprolol succinate  50 mg Oral Daily  . pantoprazole  40 mg Oral Daily  . potassium chloride  10 mEq Oral BID  . [START ON 11/13/2012] prednisoLONE acetate  1 drop Left Eye QID  . simvastatin  5 mg Oral q1800  . [START ON 11/13/2012] tetracaine  2 drop Left Eye Once  . warfarin  6 mg Oral ONCE-1800  . Warfarin - Pharmacist Dosing Inpatient   Does  not apply q1800    Assessment: 72 yr old female admitted for eye surgery for a left eye macular hole. Pt was on Coumadin PTA but it was stopped 5 days prior to surgery. The INR today was 1.09. Pt home dose was 6 mg Coumadin M,W,F and 3 mg the other days.  Goal of Therapy:  INR 2-3    Plan:  Will start with 6 mg Coumadin today. Daily INR.   Eugene Garnet 11/12/2012,4:52 PM

## 2012-11-12 NOTE — Progress Notes (Signed)
Pt arrives to 6n03 in stable condition. Unable to lie flat due to "pacer wires being uncomfortable". Pt. Assisted to sit up in bed and instructed to keep face down at all times. Unable to apply TED hose r/t pt's size. Materials management notified, but XL is "largest size we carry". Bariatric PAS hose ordered.

## 2012-11-12 NOTE — Anesthesia Postprocedure Evaluation (Signed)
  Anesthesia Post-op Note  Patient: [name]  Procedure(s) Performed: Procedure(s) with comments: 25 GAUGE PARS PLANA VITRECTOMY WITH 20 GAUGE MVR PORT FOR MACULAR HOLE (Left) MEMBRANE PEEL (Left) SERUM PATCH (Left) INSERTION OF GAS (Left) - C3F8 (expires 11/2014) HEADSCOPE LASER (Left)  Patient Location: PACU  Anesthesia Type:General  Level of Consciousness: awake, alert  and oriented  Airway and Oxygen Therapy: Patient Spontanous Breathing and Patient connected to nasal cannula oxygen  Post-op Pain: mild  Post-op Assessment: Post-op Vital signs reviewed, Patient's Cardiovascular Status Stable, Respiratory Function Stable, Patent Airway and Pain level controlled  Post-op Vital Signs: stable  Complications: No apparent anesthesia complications

## 2012-11-12 NOTE — OR Nursing (Signed)
C3F8 gas (expiration 11/2014) inserted in the left eye. Opthalmic gas information card attached to patient's hard copy chart. Opthalmic gas green information armband placed on patient's wrist.  Oralia Manis, RN

## 2012-11-12 NOTE — Transfer of Care (Signed)
Immediate Anesthesia Transfer of Care Note  Patient: [name]  Procedure(s) Performed: Procedure(s) with comments: 25 GAUGE PARS PLANA VITRECTOMY WITH 20 GAUGE MVR PORT FOR MACULAR HOLE (Left) MEMBRANE PEEL (Left) SERUM PATCH (Left) INSERTION OF GAS (Left) - C3F8 (expires 11/2014) HEADSCOPE LASER (Left)  Patient Location: PACU  Anesthesia Type:General  Level of Consciousness: awake, alert  and oriented  Airway & Oxygen Therapy: Patient Spontanous Breathing  Post-op Assessment: Report given to PACU RN  Post vital signs: Reviewed and stable  Complications: No apparent anesthesia complications

## 2012-11-12 NOTE — H&P (Signed)
I examined the patient today and there is no change in the medical status 

## 2012-11-13 LAB — GLUCOSE, CAPILLARY
Glucose-Capillary: 108 mg/dL — ABNORMAL HIGH (ref 70–99)
Glucose-Capillary: 151 mg/dL — ABNORMAL HIGH (ref 70–99)

## 2012-11-13 LAB — PROTIME-INR: INR: 1.22 (ref 0.00–1.49)

## 2012-11-13 MED ORDER — GATIFLOXACIN 0.5 % OP SOLN: 1.0000 [drp] | Freq: Four times a day (QID) | OPHTHALMIC | Status: DC

## 2012-11-13 MED ORDER — BRIMONIDINE TARTRATE 0.2 % OP SOLN: 1.0000 [drp] | Freq: Two times a day (BID) | OPHTHALMIC | Status: DC

## 2012-11-13 MED ORDER — BACITRACIN-POLYMYXIN B 500-10000 UNIT/GM OP OINT: 1.0000 "application " | TOPICAL_OINTMENT | Freq: Four times a day (QID) | OPHTHALMIC | Status: DC

## 2012-11-13 MED ORDER — PREDNISOLONE ACETATE 1 % OP SUSP: 1.0000 [drp] | Freq: Four times a day (QID) | OPHTHALMIC | Status: DC

## 2012-11-13 NOTE — Op Note (Signed)
[name], [name] NO.:  [id number]  MEDICAL RECORD NO.:  [id number]  LOCATION:  6N03C                        FACILITY:  [facility]  PHYSICIAN:  Beulah Gandy. Ashley Royalty, M.D. DATE OF BIRTH:  [birth year]  DATE OF PROCEDURE:  11/12/2012 DATE OF DISCHARGE:                              OPERATIVE REPORT   ADMISSION DIAGNOSES:  Macular hole, left eye, and peripheral retinal weakness, left eye.  PROCEDURES:  Retinal photocoagulation, pars plana vitrectomy, membrane peel, serum patch, gas-fluid exchange in the left eye.  SURGEON:  Beulah Gandy. Ashley Royalty, M.D.  ASSISTANT:  Rosalie Doctor, SA.  ANESTHESIA:  General.  DETAILS:  After usual prep and drape, indirect ophthalmoscope laser was moved into place.  Inspection of the retinal periphery showed several weak areas.  575 burns were placed around the retinal periphery with a power of 400 mW, 1000 microns each and 0.1 seconds each.  The 25-gauge trocars were placed through the pars plana at 10 o'clock and 4 o'clock. There was 20-gauge MVR incision through the conjunctiva and a 3-layered scleral wound at 2 o'clock.  Contact lens ring was anchored into place at 6 and 12 o'clock.  The flat contact lens was placed.  Pars plana vitrectomy was begun just behind the cataractous lens.  The vitrectomy was carried posteriorly and membranes were seen on the retinal surface and in the mid vitreous.  The core vitrectomy was completed and the vitrectomy was carried into the midperiphery.  Additional vitreous was removed from the midperipheral area.  The silicone tip suction line was drawn down to the macular surface and the fish strike sign occurred. The hyaloid was lifted from its attachments to the macula and the disk. The sweeping maneuver with the lighted pick and the silicone tip suction line were used to engage the vitreous and released it from its attachment to the macula.  The diamond-dusted membrane scraper was then used to remove the  internal limiting membrane from around the macular hole for approximately 1 disk diameter around the hole.  The ILM was removed with the vitreous cutter.  Kenalog 1/10th mL was injected into the vitreous cavity and down to the macular surface.  This marked THE areas of ILM and an posterior hyaloid additional work was done until the entire macular region was cleaned.  The edges of the macular hole were then scraped with the diamond-dusted membrane scraper.  The 30-degree prismatic lens was moved into place.  The vitrectomy was carried out into the far periphery for 360 degrees.  Once this was accomplished, a gas-fluid exchange was performed.  Total gas-fluid exchange was completed.  Sufficient time was allowed for additional fluid to track down the walls of the eye, collect in the posterior segment.  Serum patch was prepared during this time and C3F8 and a 16% concentration was prepared at this time.  The serum patch was delivered.  Additional fluid was removed from the posterior segment.  C3F8 was exchanged for intravitreal gas.  The instruments were removed from the eye and the sclerotomy site was closed with 1 interrupted 9-0 nylon suture at 2 o'clock.  The conjunctiva was closed with wet-field cautery.  Polymyxin and gentamicin were irrigated into  tenon space.  Atropine solution was applied.  Closing pressure was 10 with a Barraquer tonometer.  Polysporin, patch and shield were placed.  The patient was awakened and taken to the recovery room in satisfactory condition.     Beulah Gandy. Ashley Royalty, M.D.     JDM/MEDQ  D:  11/12/2012  T:  11/13/2012  Job:  [id number]

## 2012-11-13 NOTE — Discharge Planning (Addendum)
Patient discharged home in stable condition. Verbalizes understanding of all discharge instructions, including home medications and follow up appointments. 

## 2012-11-13 NOTE — Discharge Summary (Signed)
Discharge summary not needed on [facility] patients per medical records. 

## 2012-11-13 NOTE — Progress Notes (Signed)
11/13/2012, 6:38 AM  Mental Status:  Awake, Alert, Oriented  Anterior segment: Cornea  Clear    Anterior Chamber Clear    Lens:    Cataract  Intra Ocular Pressure 22 mmHg with Tonopen  Vitreous:  95% gas bubble  Retina:  Attached Good laser reaction   Impression: Excellent result Retina attached    Final Diagnosis: Principal Problem:   Macular hole, left eye   Plan: start post operative eye drops.  Discharge to home.  Give post operative instructions  Sherrie George 11/13/2012, 6:38 AM

## 2012-11-19 ENCOUNTER — Encounter (HOSPITAL_COMMUNITY): Payer: Self-pay | Admitting: Ophthalmology

## 2012-11-19 ENCOUNTER — Encounter (INDEPENDENT_AMBULATORY_CARE_PROVIDER_SITE_OTHER): Payer: Medicare Other | Admitting: Ophthalmology

## 2012-11-19 DIAGNOSIS — H35349 Macular cyst, hole, or pseudohole, unspecified eye: Secondary | ICD-10-CM

## 2012-12-09 ENCOUNTER — Encounter (INDEPENDENT_AMBULATORY_CARE_PROVIDER_SITE_OTHER): Payer: Medicare Other | Admitting: Ophthalmology

## 2012-12-09 DIAGNOSIS — H35349 Macular cyst, hole, or pseudohole, unspecified eye: Secondary | ICD-10-CM

## 2013-02-17 ENCOUNTER — Encounter (INDEPENDENT_AMBULATORY_CARE_PROVIDER_SITE_OTHER): Payer: Medicare HMO | Admitting: Ophthalmology

## 2013-02-17 DIAGNOSIS — H2510 Age-related nuclear cataract, unspecified eye: Secondary | ICD-10-CM

## 2013-02-17 DIAGNOSIS — E1139 Type 2 diabetes mellitus with other diabetic ophthalmic complication: Secondary | ICD-10-CM

## 2013-02-17 DIAGNOSIS — H35349 Macular cyst, hole, or pseudohole, unspecified eye: Secondary | ICD-10-CM

## 2013-02-17 DIAGNOSIS — E11319 Type 2 diabetes mellitus with unspecified diabetic retinopathy without macular edema: Secondary | ICD-10-CM

## 2013-02-17 DIAGNOSIS — I10 Essential (primary) hypertension: Secondary | ICD-10-CM

## 2013-02-17 DIAGNOSIS — E1165 Type 2 diabetes mellitus with hyperglycemia: Secondary | ICD-10-CM

## 2013-02-17 DIAGNOSIS — H35039 Hypertensive retinopathy, unspecified eye: Secondary | ICD-10-CM

## 2013-02-17 DIAGNOSIS — H43819 Vitreous degeneration, unspecified eye: Secondary | ICD-10-CM

## 2013-08-19 ENCOUNTER — Ambulatory Visit (INDEPENDENT_AMBULATORY_CARE_PROVIDER_SITE_OTHER): Payer: Medicare HMO | Admitting: Ophthalmology

## 2013-08-19 DIAGNOSIS — E1139 Type 2 diabetes mellitus with other diabetic ophthalmic complication: Secondary | ICD-10-CM

## 2013-08-19 DIAGNOSIS — H43819 Vitreous degeneration, unspecified eye: Secondary | ICD-10-CM

## 2013-08-19 DIAGNOSIS — I10 Essential (primary) hypertension: Secondary | ICD-10-CM

## 2013-08-19 DIAGNOSIS — H35039 Hypertensive retinopathy, unspecified eye: Secondary | ICD-10-CM

## 2013-08-19 DIAGNOSIS — E1165 Type 2 diabetes mellitus with hyperglycemia: Secondary | ICD-10-CM

## 2013-08-19 DIAGNOSIS — H35349 Macular cyst, hole, or pseudohole, unspecified eye: Secondary | ICD-10-CM

## 2013-08-19 DIAGNOSIS — E11319 Type 2 diabetes mellitus with unspecified diabetic retinopathy without macular edema: Secondary | ICD-10-CM

## 2013-10-24 ENCOUNTER — Encounter (HOSPITAL_COMMUNITY): Payer: Self-pay | Admitting: Pharmacy Technician

## 2013-10-28 ENCOUNTER — Encounter (HOSPITAL_COMMUNITY): Payer: Self-pay | Admitting: *Deleted

## 2013-10-28 ENCOUNTER — Other Ambulatory Visit: Payer: Self-pay | Admitting: Ophthalmology

## 2013-10-28 MED ORDER — CYCLOPENTOLATE HCL 1 % OP SOLN
1.0000 [drp] | OPHTHALMIC | Status: AC
  Administered 2013-10-29: 1 [drp] via OPHTHALMIC

## 2013-10-28 MED ORDER — GATIFLOXACIN 0.5 % OP SOLN
1.0000 [drp] | OPHTHALMIC | Status: DC | PRN
  Administered 2013-10-29 (×2): 1 [drp] via OPHTHALMIC

## 2013-10-28 MED ORDER — TETRACAINE HCL 0.5 % OP SOLN: 1.0000 [drp] | OPHTHALMIC | Status: AC

## 2013-10-28 MED ORDER — KETOROLAC TROMETHAMINE 0.5 % OP SOLN
1.0000 [drp] | OPHTHALMIC | Status: AC
  Administered 2013-10-29: 1 [drp] via OPHTHALMIC

## 2013-10-28 MED ORDER — PHENYLEPHRINE HCL 2.5 % OP SOLN
1.0000 [drp] | OPHTHALMIC | Status: AC
  Administered 2013-10-29: 1 [drp] via OPHTHALMIC

## 2013-10-28 NOTE — H&P (Signed)
History & Physical:   DATE:10-07-13     NAME:  [name], [name]     [id number]       HISTORY OF PRESENT ILLNESS: Chief Complaints:Patient notice vision decrease OS  Pre-Op cataract   HPI: EYES: Reports symptoms of vision disturbances .   difficulty reading news paper  peoples faces are not clear  Diabetes last HgA1c 5.7     LOCATION:   LEFT EYE        QUALITY/COURSE:   Reports condition is worsening.        INTENSITY/SEVERITY:    Reports measurement ( or degree) as moderate.      DURATION:   Reports the general length of symptoms to be months.     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, left eye   ICD10: H25.12  ICD9:   Onset: 10/24/2013 14:58  Initial Date:    Chronic angle-closure glaucoma   ICD10: H40.229  ICD9: 365.23  Onset: 10/07/2013 10:21  Initial Date:    Macular hole   ICD10: H35.349 ICD9: 362.54 Onset: 10/07/2012 11:18 Initial Date:    Macular edema, diabetic   ICD10:   ICD9: 362.07  Onset: 10/07/2012 11:18  Initial Date:    Benign hypertension   ICD10: I10  ICD9: 401.1  Onset:   Initial Date:    Diabetes - Type 2   ICD10: E11.9  Onset:   Initial Date:   ICD9: 250.00 250.50  Hypertensive retinopathy   ICD10: H35.039  ICD9: 362.11  Onset:   Initial Date:     Hyperthyroidism   ICD10: E05.90  ICD9: 242.90  Onset:   Initial Date:      Dry eye syndrome   ICD10: H04.129  ICD9: 375.15  Onset:   Initial Date:  SURGERIES: PPV with C3F8   OCt 28,2014 OS   MEDICATIONS: Ciloxan (Ciprofloxacin) Solution: 0.3% solution SIG-  1 gtt OS BID  REVIEW OF SYSTEMS: ROS:   GEN- Constitutional: HENT: GEN - Endocrine: Reports symptoms of diabetes.    thyroid disease LUNGS/Respiratory:  HEART/Cardiovascular: Reports symptoms of hypertension, heart trouble.    ABD/Gastrointestinal:  Musculoskeletal (BJE): +++++      knee pain or problems   NEURO/Neurological: PSYCH/Psychiatric:  Normal  TOBACCO: No exposure to tobacco.    :  SOCIAL HISTORY: reetired  FAMILY  HISTORY:Family History - 1st Degree Relatives:  Mother dead.    ALLERGIES: Drug Allergies.  No Known.   Starter - Allergies - Summary:  PHYSICAL EXAMINATION: VS: BMI: 38.7.  BP: 110/70.  H: 65.00 in.  RR: 20 /min.  W: 232lbs 0oz.    Va     OD:cc 20/25+1 PH 20/NI OS:cc 20/400 lines look zig zag PH 20/200 moving  EYEGLASSES:  OD:+2.00+0.50x162                                              OS:+1.00+0.75x014 ADD:+2.50  MR 01/26/2012 13:01   OD: OS +!.50  +0.75 X 30    20/200 ADD Auto Refraction:04/08/2013 10:26  OD: OS:-1.25+0.75x018  MR:10/07/2013 10:03 Dr. Venetia Maxon  OD: OS: -1.75 20/100+  MR:10/07/2013 08:53  K's OD:43.25 43.75 OS:43.50 43.50  Motility: full  PUPILS: 65mm - MG  EYELIDS & OCULAR ADNEXA: normal  SLE: Conjunctiva: quiet  Cornea: arcus OU    Anterior  Chamber;  deep and quiet  OU  Iris:  PERIPHERAL  IRIDECTOMY   open OU  Lens: +2   nuclear  sclerosis  OD,  +3  nuclear  sclerosis  OS   Vitreous:  CCT:  Ta   in mmHg    OD 18    OS  15 Time:10/07/2013 10:15  Dilation:    Fundus:   optic nerve: OD:    rim intact temporal discoloration                                               OS: temporal discoloration and flattened 45% cup   Macula:       OD:   clear                                                  OS: flat    Vessels:narrrow arteroles.  Periphery: nornal Exam: GENERAL: Appearance: HEAD, EARS, NOSE AND THROAT: Ears-Nose (external) Inspection: Externally, nose and ears are normal in appearance and without scars, lesions, or nodules.      Hearing assessment shows no problems with normal conversation.      LUNGS and RESPIRATORY: Lung auscultation elicits no wheezing, rhonci, rales or rubs and with equal breath sounds.    Respiratory effort described as breathing is unlabored and chest movement is symmetrical.    HEART (Cardiovascular): Heart auscultation discovers regular rate and rhythm; no murmur, gallop or rub. Normal heart sounds.     ABDOMEN (Gastrointestinal): Mass/Tenderness Exam: Neither are present.     MUSCULOSKELETAL (BJE): Inspection-Palpation: No major bone, joint, tendon, or muscle changes.      NEUROLOGICAL: Alert and oriented. No major deficits of coordination or sensation.      PSYCHIATRIC: Insight and judgment appear  both to be intact and appropriate.    Mood and affect are described as normal mood and full affect.    SKIN: Skin Inspection: No rashes or lesions  ADMITTING DIAGNOSIS: Age-related nuclear cataract, left eye   ICD10: H25.12  ICD9:    Chronic angle-closure glaucoma   ICD10: H40.229  ICD9: 365.23  Onset: 10/07/2013 10:21   Macular hole   ICD10: H35.349 ICD9: 362.54 Onset: 10/07/2012 11:18 Initial Date:    Macular edema, diabetic   ICD10:   ICD9: 362.07  Onset: 10/07/2012 11:18  Initial Date:    Benign hypertension   ICD10: I10  ICD9: 401.1   Diabetes - Type 2   ICD10: E11.9  250.50  Hypertensive retinopathy   ICD10: H35.039  ICD9: 362.11    Hyperthyroidism   ICD10: E05.90  ICD9: 242.90     Dry eye syndrome   ICD10: H04.129  ICD9: 375.15  SURGICAL TREATMENT PLAN: phaco emulsion cataract extraction   w  intraocular lens implant  OS  cleared by retina     Risk and benefits of surgery have been reviewed with the patient and the patient agrees to proceed with the surgical procedure.     ___________________________ [name], [name]. Cerebrovascular accident (CVA or stroke]   ICD10: I63.9  ICD9: 436  1998 Insertion of heart pacemaker  #[id number]  Related Dxs-  Modifiers-   2008   Starter - Inactive Problems:

## 2013-10-28 NOTE — H&P (Signed)
History & Physical:   DATE:10-07-13     NAME:  [name], [name]     [id number]       HISTORY OF PRESENT ILLNESS: Chief Complaints:Patient notice vision decrease OS  Pre-Op cataract   HPI: EYES: Reports symptoms of vision disturbances .   difficulty reading news paper  peoples faces are not clear  Diabetes last HgA1c 5.7     LOCATION:   LEFT EYE        QUALITY/COURSE:   Reports condition is worsening.        INTENSITY/SEVERITY:    Reports measurement ( or degree) as moderate.      DURATION:   Reports the general length of symptoms to be months.     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, left eye   ICD10: H25.12  ICD9:   Onset: 10/24/2013 14:58  Initial Date:    Chronic angle-closure glaucoma   ICD10: H40.229  ICD9: 365.23  Onset: 10/07/2013 10:21  Initial Date:    Macular hole   ICD10: H35.349 ICD9: 362.54 Onset: 10/07/2012 11:18 Initial Date:    Macular edema, diabetic   ICD10:   ICD9: 362.07  Onset: 10/07/2012 11:18  Initial Date:    Benign hypertension   ICD10: I10  ICD9: 401.1  Onset:   Initial Date:    Diabetes - Type 2   ICD10: E11.9  Onset:   Initial Date:   ICD9: 250.00 250.50  Hypertensive retinopathy   ICD10: H35.039  ICD9: 362.11  Onset:   Initial Date:     Hyperthyroidism   ICD10: E05.90  ICD9: 242.90  Onset:   Initial Date:      Dry eye syndrome   ICD10: H04.129  ICD9: 375.15  Onset:   Initial Date:  SURGERIES: PPV with C3F8   OCt 28,2014 OS   MEDICATIONS: Ciloxan (Ciprofloxacin) Solution: 0.3% solution SIG-  1 gtt OS BID  REVIEW OF SYSTEMS: ROS:   GEN- Constitutional: HENT: GEN - Endocrine: Reports symptoms of diabetes.    thyroid disease LUNGS/Respiratory:  HEART/Cardiovascular: Reports symptoms of hypertension, heart trouble.    ABD/Gastrointestinal:  Musculoskeletal (BJE): +++++      knee pain or problems   NEURO/Neurological: PSYCH/Psychiatric:  Normal  TOBACCO: No exposure to tobacco.    :  SOCIAL HISTORY: reetired  FAMILY  HISTORY:Family History - 1st Degree Relatives:  Mother dead.    ALLERGIES: Drug Allergies.  No Known.   Starter - Allergies - Summary:  PHYSICAL EXAMINATION: VS: BMI: 38.7.  BP: 110/70.  H: 65.00 in.  RR: 20 /min.  W: 232lbs 0oz.    Va     OD:cc 20/25+1 PH 20/NI OS:cc 20/400 lines look zig zag PH 20/200 moving  EYEGLASSES:  OD:+2.00+0.50x162                                              OS:+1.00+0.75x014 ADD:+2.50  MR 01/26/2012 13:01   OD: OS +!.50  +0.75 X 30    20/200 ADD Auto Refraction:04/08/2013 10:26  OD: OS:-1.25+0.75x018  MR:10/07/2013 10:03 Dr. Venetia Maxon  OD: OS: -1.75 20/100+  MR:10/07/2013 08:53  K's OD:43.25 43.75 OS:43.50 43.50  Motility: full  PUPILS: 13mm - MG  EYELIDS & OCULAR ADNEXA: normal  SLE: Conjunctiva: quiet  Cornea: arcus OU    Anterior Chamber;  deep and quiet  OU  Iris:  PERIPHERAL  IRIDECTOMY   open OU  Lens: +2   nuclear  sclerosis  OD,  +3  nuclear  sclerosis  OS   Vitreous:  CCT:  Ta   in mmHg    OD 18    OS  15 Time:10/07/2013 10:15  Dilation:    Fundus:   optic nerve: OD:    rim intact temporal discoloration                                               OS: temporal discoloration and flattened 45% cup   Macula:       OD:   clear                                                  OS: flat    Vessels:narrrow arteroles.  Periphery: nornal Exam: GENERAL: Appearance: HEAD, EARS, NOSE AND THROAT: Ears-Nose (external) Inspection: Externally, nose and ears are normal in appearance and without scars, lesions, or nodules.      Hearing assessment shows no problems with normal conversation.      LUNGS and RESPIRATORY: Lung auscultation elicits no wheezing, rhonci, rales or rubs and with equal breath sounds.    Respiratory effort described as breathing is unlabored and chest movement is symmetrical.    HEART (Cardiovascular): Heart auscultation discovers regular rate and rhythm; no murmur, gallop or rub. Normal heart sounds.     ABDOMEN (Gastrointestinal): Mass/Tenderness Exam: Neither are present.     MUSCULOSKELETAL (BJE): Inspection-Palpation: No major bone, joint, tendon, or muscle changes.      NEUROLOGICAL: Alert and oriented. No major deficits of coordination or sensation.      PSYCHIATRIC: Insight and judgment appear  both to be intact and appropriate.    Mood and affect are described as normal mood and full affect.    SKIN: Skin Inspection: No rashes or lesions  ADMITTING DIAGNOSIS: Age-related nuclear cataract, left eye   ICD10: H25.12  ICD9:    Chronic angle-closure glaucoma   ICD10: H40.229  ICD9: 365.23  Onset: 10/07/2013 10:21   Macular hole   ICD10: H35.349 ICD9: 362.54 Onset: 10/07/2012 11:18 Initial Date:    Macular edema, diabetic   ICD10:   ICD9: 362.07  Onset: 10/07/2012 11:18  Initial Date:    Benign hypertension   ICD10: I10  ICD9: 401.1   Diabetes - Type 2   ICD10: E11.9  250.50  Hypertensive retinopathy   ICD10: H35.039  ICD9: 362.11    Hyperthyroidism   ICD10: E05.90  ICD9: 242.90     Dry eye syndrome   ICD10: H04.129  ICD9: 375.15  SURGICAL TREATMENT PLAN: phaco emulsion cataract extraction   w  intraocular lens implant  OS  cleared by retina     Risk and benefits of surgery have been reviewed with the patient and the patient agrees to proceed with the surgical procedure.     ___________________________ [name], [name]. Cerebrovascular accident (CVA or stroke]   ICD10: I63.9  ICD9: 436  1998 Insertion of heart pacemaker  #[id number]  Related Dxs-  Modifiers-   2008   Starter - Inactive Problems:

## 2013-10-29 ENCOUNTER — Ambulatory Visit (HOSPITAL_COMMUNITY): Payer: Medicare HMO | Admitting: Anesthesiology

## 2013-10-29 ENCOUNTER — Encounter (HOSPITAL_COMMUNITY): Payer: Medicare HMO | Admitting: Anesthesiology

## 2013-10-29 ENCOUNTER — Encounter (HOSPITAL_COMMUNITY)
Admission: RE | Disposition: A | Discharge status: Home / Self Care | Payer: Self-pay | Source: Ambulatory Visit | Attending: Ophthalmology

## 2013-10-29 ENCOUNTER — Encounter (HOSPITAL_COMMUNITY): Payer: Self-pay | Admitting: *Deleted

## 2013-10-29 ENCOUNTER — Ambulatory Visit (HOSPITAL_COMMUNITY)
Admission: RE | Admit: 2013-10-29 | Discharge: 2013-10-29 | Disposition: A | Discharge status: Home / Self Care | Payer: Medicare HMO | Source: Ambulatory Visit | Attending: Ophthalmology | Admitting: Ophthalmology

## 2013-10-29 DIAGNOSIS — H35349 Macular cyst, hole, or pseudohole, unspecified eye: Secondary | ICD-10-CM | POA: Diagnosis not present

## 2013-10-29 DIAGNOSIS — E11311 Type 2 diabetes mellitus with unspecified diabetic retinopathy with macular edema: Secondary | ICD-10-CM | POA: Diagnosis not present

## 2013-10-29 DIAGNOSIS — E119 Type 2 diabetes mellitus without complications: Secondary | ICD-10-CM | POA: Diagnosis not present

## 2013-10-29 DIAGNOSIS — Z6838 Body mass index (BMI) 38.0-38.9, adult: Secondary | ICD-10-CM | POA: Diagnosis not present

## 2013-10-29 DIAGNOSIS — H04129 Dry eye syndrome of unspecified lacrimal gland: Secondary | ICD-10-CM | POA: Diagnosis not present

## 2013-10-29 DIAGNOSIS — H269 Unspecified cataract: Secondary | ICD-10-CM | POA: Insufficient documentation

## 2013-10-29 DIAGNOSIS — I10 Essential (primary) hypertension: Secondary | ICD-10-CM | POA: Diagnosis not present

## 2013-10-29 DIAGNOSIS — E0590 Thyrotoxicosis, unspecified without thyrotoxic crisis or storm: Secondary | ICD-10-CM | POA: Insufficient documentation

## 2013-10-29 DIAGNOSIS — H35039 Hypertensive retinopathy, unspecified eye: Secondary | ICD-10-CM | POA: Insufficient documentation

## 2013-10-29 HISTORY — DX: Phlebitis and thrombophlebitis of unspecified site: I80.9

## 2013-10-29 HISTORY — DX: Irritable bowel syndrome, unspecified: K58.9

## 2013-10-29 HISTORY — DX: Shortness of breath: R06.02

## 2013-10-29 HISTORY — PX: CATARACT EXTRACTION W/PHACO: SHX586

## 2013-10-29 HISTORY — DX: Edema, unspecified: R60.9

## 2013-10-29 HISTORY — DX: Pulmonary hypertension, unspecified: I27.20

## 2013-10-29 LAB — BASIC METABOLIC PANEL
Anion gap: 15 (ref 5–15)
BUN: 27 mg/dL — AB (ref 6–23)
CO2: 24 mEq/L (ref 19–32)
CREATININE: 0.73 mg/dL (ref 0.50–1.10)
Calcium: 9.1 mg/dL (ref 8.4–10.5)
Chloride: 101 mEq/L (ref 96–112)
GFR, EST NON AFRICAN AMERICAN: 83 mL/min — AB (ref >90)
Glucose, Bld: 86 mg/dL (ref 70–99)
POTASSIUM: 5 meq/L (ref 3.7–5.3)
Sodium: 140 mEq/L (ref 137–147)

## 2013-10-29 LAB — CBC
HEMATOCRIT: 42 % (ref 36.0–46.0)
Hemoglobin: 13.8 g/dL (ref 12.0–15.0)
MCH: 31.7 pg (ref 26.0–34.0)
MCHC: 32.9 g/dL (ref 30.0–36.0)
MCV: 96.6 fL (ref 78.0–100.0)
Platelets: 223 10*3/uL (ref 150–400)
RBC: 4.35 MIL/uL (ref 3.87–5.11)
RDW: 13.9 % (ref 11.5–15.5)
WBC: 5.8 10*3/uL (ref 4.0–10.5)

## 2013-10-29 LAB — GLUCOSE, CAPILLARY
Glucose-Capillary: 91 mg/dL (ref 70–99)
Glucose-Capillary: 97 mg/dL (ref 70–99)
Glucose-Capillary: 99 mg/dL (ref 70–99)

## 2013-10-29 LAB — PROTIME-INR
INR: 2.24 — ABNORMAL HIGH (ref 0.00–1.49)
PROTHROMBIN TIME: 24.8 s — AB (ref 11.6–15.2)

## 2013-10-29 LAB — APTT: aPTT: 35 seconds (ref 24–37)

## 2013-10-29 SURGERY — PHACOEMULSIFICATION, CATARACT, WITH IOL INSERTION
Anesthesia: Monitor Anesthesia Care | Site: Eye | Laterality: Left

## 2013-10-29 MED ORDER — BSS IO SOLN
INTRAOCULAR | Status: AC
  Filled 2013-10-29: qty 500

## 2013-10-29 MED ORDER — KETOROLAC TROMETHAMINE 0.5 % OP SOLN
1.0000 [drp] | OPHTHALMIC | Status: AC
  Administered 2013-10-29 (×2): 1 [drp] via OPHTHALMIC

## 2013-10-29 MED ORDER — SODIUM HYALURONATE 10 MG/ML IO SOLN
INTRAOCULAR | Status: AC
  Filled 2013-10-29: qty 0.85

## 2013-10-29 MED ORDER — KETOROLAC TROMETHAMINE 0.5 % OP SOLN
OPHTHALMIC | Status: DC
Start: 2013-10-29 — End: 2013-10-29
  Filled 2013-10-29: qty 5

## 2013-10-29 MED ORDER — PROPOFOL 10 MG/ML IV BOLUS
INTRAVENOUS | Status: AC
  Filled 2013-10-29: qty 20

## 2013-10-29 MED ORDER — LIDOCAINE-EPINEPHRINE 2 %-1:100000 IJ SOLN
INTRAMUSCULAR | Status: DC | PRN
  Administered 2013-10-29: 11:00:00 via RETROBULBAR

## 2013-10-29 MED ORDER — BSS IO SOLN
INTRAOCULAR | Status: AC
  Filled 2013-10-29: qty 15

## 2013-10-29 MED ORDER — NA CHONDROIT SULF-NA HYALURON 40-30 MG/ML IO SOLN
INTRAOCULAR | Status: DC | PRN
  Administered 2013-10-29: 0.5 mL via INTRAOCULAR

## 2013-10-29 MED ORDER — PROPOFOL INFUSION 10 MG/ML OPTIME
INTRAVENOUS | Status: DC | PRN
  Administered 2013-10-29: 50 ug/kg/min via INTRAVENOUS

## 2013-10-29 MED ORDER — PHENYLEPHRINE HCL 2.5 % OP SOLN
OPHTHALMIC | Status: DC
Start: 2013-10-29 — End: 2013-10-29
  Filled 2013-10-29: qty 2

## 2013-10-29 MED ORDER — LIDOCAINE-EPINEPHRINE 2 %-1:100000 IJ SOLN
INTRAMUSCULAR | Status: AC
  Filled 2013-10-29: qty 1

## 2013-10-29 MED ORDER — GATIFLOXACIN 0.5 % OP SOLN
OPHTHALMIC | Status: AC
  Administered 2013-10-29: 1 [drp]
  Filled 2013-10-29: qty 2.5

## 2013-10-29 MED ORDER — HYALURONIDASE HUMAN 150 UNIT/ML IJ SOLN
INTRAMUSCULAR | Status: AC
  Filled 2013-10-29: qty 1

## 2013-10-29 MED ORDER — SODIUM CHLORIDE 0.9 % IV SOLN: INTRAVENOUS | Status: DC

## 2013-10-29 MED ORDER — FENTANYL CITRATE 0.05 MG/ML IJ SOLN
INTRAMUSCULAR | Status: DC | PRN
  Administered 2013-10-29: 25 ug via INTRAVENOUS

## 2013-10-29 MED ORDER — 0.9 % SODIUM CHLORIDE (POUR BTL) OPTIME
TOPICAL | Status: DC | PRN
  Administered 2013-10-29: 1000 mL

## 2013-10-29 MED ORDER — GENTAMICIN SULFATE 40 MG/ML IJ SOLN
INTRAMUSCULAR | Status: AC
  Filled 2013-10-29: qty 2

## 2013-10-29 MED ORDER — PROPOFOL 10 MG/ML IV BOLUS
INTRAVENOUS | Status: DC | PRN
  Administered 2013-10-29: 30 mg via INTRAVENOUS

## 2013-10-29 MED ORDER — MIDAZOLAM HCL 2 MG/2ML IJ SOLN
INTRAMUSCULAR | Status: AC
  Filled 2013-10-29: qty 2

## 2013-10-29 MED ORDER — BSS IO SOLN
INTRAOCULAR | Status: DC | PRN
  Administered 2013-10-29: 10:00:00

## 2013-10-29 MED ORDER — TOBRAMYCIN 0.3 % OP OINT
TOPICAL_OINTMENT | OPHTHALMIC | Status: DC | PRN
  Administered 2013-10-29: 1 via OPHTHALMIC

## 2013-10-29 MED ORDER — LIDOCAINE HCL 2 % IJ SOLN
INTRAMUSCULAR | Status: AC
  Filled 2013-10-29: qty 20

## 2013-10-29 MED ORDER — PHENYLEPHRINE HCL 2.5 % OP SOLN
1.0000 [drp] | OPHTHALMIC | Status: AC
  Administered 2013-10-29 (×2): 1 [drp] via OPHTHALMIC

## 2013-10-29 MED ORDER — PILOCARPINE HCL 4 % OP SOLN
OPHTHALMIC | Status: AC
  Filled 2013-10-29: qty 15

## 2013-10-29 MED ORDER — BUPIVACAINE HCL (PF) 0.75 % IJ SOLN
INTRAMUSCULAR | Status: AC
  Filled 2013-10-29: qty 10

## 2013-10-29 MED ORDER — TETRACAINE HCL 0.5 % OP SOLN
OPHTHALMIC | Status: DC | PRN
  Administered 2013-10-29: 1 [drp] via OPHTHALMIC

## 2013-10-29 MED ORDER — TOBRAMYCIN-DEXAMETHASONE 0.3-0.1 % OP OINT
TOPICAL_OINTMENT | OPHTHALMIC | Status: AC
  Filled 2013-10-29: qty 3.5

## 2013-10-29 MED ORDER — SODIUM CHLORIDE 0.9 % IV SOLN
INTRAVENOUS | Status: DC | PRN
  Administered 2013-10-29 (×2): via INTRAVENOUS

## 2013-10-29 MED ORDER — MIDAZOLAM HCL 5 MG/5ML IJ SOLN
INTRAMUSCULAR | Status: DC | PRN
  Administered 2013-10-29: 0.5 mg via INTRAVENOUS

## 2013-10-29 MED ORDER — ACETYLCHOLINE CHLORIDE 1:100 IO SOLR
INTRAOCULAR | Status: DC | PRN
  Administered 2013-10-29: 20 mg via INTRAOCULAR

## 2013-10-29 MED ORDER — CYCLOPENTOLATE HCL 1 % OP SOLN
OPHTHALMIC | Status: AC
  Filled 2013-10-29: qty 2

## 2013-10-29 MED ORDER — NA CHONDROIT SULF-NA HYALURON 40-30 MG/ML IO SOLN
INTRAOCULAR | Status: AC
  Filled 2013-10-29: qty 0.5

## 2013-10-29 MED ORDER — ONDANSETRON HCL 4 MG/2ML IJ SOLN
INTRAMUSCULAR | Status: AC
  Filled 2013-10-29: qty 2

## 2013-10-29 MED ORDER — EPINEPHRINE HCL 1 MG/ML IJ SOLN
INTRAMUSCULAR | Status: AC
  Filled 2013-10-29: qty 1

## 2013-10-29 MED ORDER — CYCLOPENTOLATE HCL 1 % OP SOLN
1.0000 [drp] | OPHTHALMIC | Status: AC
  Administered 2013-10-29 (×2): 1 [drp] via OPHTHALMIC

## 2013-10-29 MED ORDER — FENTANYL CITRATE 0.05 MG/ML IJ SOLN
INTRAMUSCULAR | Status: AC
  Filled 2013-10-29: qty 5

## 2013-10-29 MED ORDER — SODIUM HYALURONATE 10 MG/ML IO SOLN
INTRAOCULAR | Status: DC | PRN
  Administered 2013-10-29: 0.85 mL via INTRAOCULAR

## 2013-10-29 MED ORDER — ONDANSETRON HCL 4 MG/2ML IJ SOLN
INTRAMUSCULAR | Status: DC | PRN
  Administered 2013-10-29: 4 mg via INTRAVENOUS

## 2013-10-29 MED ORDER — TETRACAINE HCL 0.5 % OP SOLN
OPHTHALMIC | Status: AC
  Filled 2013-10-29: qty 2

## 2013-10-29 SURGICAL SUPPLY — 33 items
APL SRG 3 HI ABS STRL LF PLS (MISCELLANEOUS) ×1
APPLICATOR COTTON TIP 6IN STRL (MISCELLANEOUS) ×3 IMPLANT
APPLICATOR DR MATTHEWS STRL (MISCELLANEOUS) ×3 IMPLANT
BLADE KERATOME 2.75 (BLADE) ×2 IMPLANT
BLADE KERATOME 2.75MM (BLADE) ×1
CANNULA ANTERIOR CHAMBER 27GA (MISCELLANEOUS) ×3 IMPLANT
COVER MAYO STAND STRL (DRAPES) ×3 IMPLANT
DRAPE OPHTHALMIC 40X48 W POUCH (DRAPES) ×3 IMPLANT
DRAPE RETRACTOR (MISCELLANEOUS) ×3 IMPLANT
GLOVE BIO SURGEON STRL SZ8 (GLOVE) ×3 IMPLANT
GLOVE SURG SS PI 7.0 STRL IVOR (GLOVE) ×4 IMPLANT
GOWN STRL REUS W/ TWL LRG LVL3 (GOWN DISPOSABLE) ×2 IMPLANT
GOWN STRL REUS W/TWL LRG LVL3 (GOWN DISPOSABLE) ×6
KIT BASIN OR (CUSTOM PROCEDURE TRAY) ×3 IMPLANT
KIT ROOM TURNOVER OR (KITS) ×3 IMPLANT
LENS IOL ACRSF IQ PC 22.5 (Intraocular Lens) IMPLANT
LENS IOL ACRYSOF IQ POST 22.5 (Intraocular Lens) ×3 IMPLANT
NDL 18GX1X1/2 (RX/OR ONLY) (NEEDLE) ×1 IMPLANT
NDL 25GX 5/8IN NON SAFETY (NEEDLE) ×1 IMPLANT
NDL FILTER BLUNT 18X1 1/2 (NEEDLE) ×1 IMPLANT
NEEDLE 18GX1X1/2 (RX/OR ONLY) (NEEDLE) ×3 IMPLANT
NEEDLE 25GX 5/8IN NON SAFETY (NEEDLE) ×3 IMPLANT
NEEDLE FILTER BLUNT 18X 1/2SAF (NEEDLE) ×2
NEEDLE FILTER BLUNT 18X1 1/2 (NEEDLE) ×1 IMPLANT
NS IRRIG 1000ML POUR BTL (IV SOLUTION) ×3 IMPLANT
PACK CATARACT CUSTOM (CUSTOM PROCEDURE TRAY) ×3 IMPLANT
PAD ARMBOARD 7.5X6 YLW CONV (MISCELLANEOUS) ×5 IMPLANT
PAK PIK CVS CATARACT (OPHTHALMIC) ×3 IMPLANT
SYR TB 1ML LUER SLIP (SYRINGE) ×3 IMPLANT
TIP ABS 45DEG FLARED 0.9MM (TIP) ×3 IMPLANT
TOWEL OR 17X26 10 PK STRL BLUE (TOWEL DISPOSABLE) ×3 IMPLANT
WATER STERILE IRR 1000ML POUR (IV SOLUTION) ×3 IMPLANT
WIPE INSTRUMENT VISIWIPE 73X73 (MISCELLANEOUS) ×3 IMPLANT

## 2013-10-29 NOTE — Anesthesia Preprocedure Evaluation (Addendum)
Anesthesia Evaluation  Patient identified by MRN, date of birth, ID band Patient awake    Reviewed: Allergy & Precautions, H&P , NPO status , Patient's Chart, lab work & pertinent test results, reviewed documented beta blocker date and time   Airway Mallampati: III TM Distance: >3 FB Neck ROM: Full    Dental  (+) Dental Advisory Given, Edentulous Upper, Edentulous Lower   Pulmonary neg pulmonary ROS,  breath sounds clear to auscultation        Cardiovascular hypertension, Pt. on home beta blockers and Pt. on medications + dysrhythmias (On VVI at rate of 70 bpm. Nonpacemaker dependent.) + pacemaker Rhythm:Regular Rate:Normal     Neuro/Psych CVA, No Residual Symptoms negative psych ROS   GI/Hepatic Neg liver ROS, hiatal hernia, GERD-  Controlled and Medicated,  Endo/Other  diabetes, Well Controlled, Type 2, Oral Hypoglycemic AgentsHypothyroidism Morbid obesity  Renal/GU      Musculoskeletal   Abdominal   Peds  Hematology   Anesthesia Other Findings   Reproductive/Obstetrics                        Anesthesia Physical Anesthesia Plan  ASA: III  Anesthesia Plan: MAC   Post-op Pain Management:    Induction: Intravenous  Airway Management Planned: Simple Face Mask  Additional Equipment: None  Intra-op Plan:   Post-operative Plan:   Informed Consent: I have reviewed the patients History and Physical, chart, labs and discussed the procedure including the risks, benefits and alternatives for the proposed anesthesia with the patient or authorized representative who has indicated his/her understanding and acceptance.     Plan Discussed with: CRNA and Surgeon  Anesthesia Plan Comments:         Anesthesia Quick Evaluation

## 2013-10-29 NOTE — Anesthesia Postprocedure Evaluation (Signed)
  Anesthesia Post-op Note  Patient: [name]  Procedure(s) Performed: Procedure(s): LEFT CATARACT EXTRACTION PHACO AND INTRAOCULAR LENS PLACEMENT ([facility]) (Left)  Patient Location: PACU  Anesthesia Type:MAC  Level of Consciousness: awake, alert  and oriented  Airway and Oxygen Therapy: Patient Spontanous Breathing  Post-op Pain: none  Post-op Assessment: Post-op Vital signs reviewed  Post-op Vital Signs: Reviewed  Last Vitals:  Filed Vitals:   10/29/13 1156  BP: 119/62  Pulse: 71  Temp:   Resp:     Complications: No apparent anesthesia complications

## 2013-10-29 NOTE — Progress Notes (Addendum)
[employer] called about pt having a pacemaker.Spoke with Tomi Bamberger, states she will eval.

## 2013-10-29 NOTE — Progress Notes (Signed)
[employer] rep Tomi Bamberger here to eval pt.  Pt placed on Heart monitor.

## 2013-10-29 NOTE — Discharge Instructions (Signed)
The patient may remove the eye patch today at 2:30 and applied the eye drops given to her at the office and previously prescribed. The patient should use eye shield sunglasses or eyeglasses over her eye at all times sleep with the eye she'll avoid sleeping on the left side. Sleep on back or right side. The patient should avoid heavy lifting bending or straining

## 2013-10-29 NOTE — Interval H&P Note (Signed)
History and Physical Interval Note:  10/29/2013 10:07 AM  [name]  has presented today for surgery, with the diagnosis of Cataract, nuclear, left [H25.12]  The various methods of treatment have been discussed with the patient and family. After consideration of risks, benefits and other options for treatment, the patient has consented to  Procedure(s): LEFT CATARACT EXTRACTION PHACO AND INTRAOCULAR LENS PLACEMENT ([place]) (Left) as a surgical intervention .  The patient's history has been reviewed, patient examined, no change in status, stable for surgery.  I have reviewed the patient's chart and labs.  Questions were answered to the patient's satisfaction.     [name]

## 2013-10-29 NOTE — Interval H&P Note (Signed)
History and Physical Interval Note:  10/29/2013 10:07 AM  [name]  has presented today for surgery, with the diagnosis of Cataract, nuclear, left [H25.12]  The various methods of treatment have been discussed with the patient and family. After consideration of risks, benefits and other options for treatment, the patient has consented to  Procedure(s): LEFT CATARACT EXTRACTION PHACO AND INTRAOCULAR LENS PLACEMENT ([place]) (Left) as a surgical intervention .  The patient's history has been reviewed, patient examined, no change in status, stable for surgery.  I have reviewed the patient's chart and labs.  Questions were answered to the patient's satisfaction.     Khrystyna Schwalm

## 2013-10-29 NOTE — Op Note (Signed)
Preoperative diagnosis: Vision significant cataract right eye and controlled the glaucoma Postoperative diagnosis: Same Procedure: Phacoemulsification with intraocular lens implant Complications: None Anesthesia: 2% Xylocaine with epinephrine a 50-50 mixture 0.75% Marcaine with ample [place] Assistant: Minglee Procedure: The patient was transported to the operating room where she was given a peribulbar block with the aforementioned local anesthetic agent. Following this the patient's face was prepped and draped in the usual sterile fashion. With the surgeon sitting temporally the operating microscope in position a Weck-Cel sponge was used to fixate the globe and a 15 blade was used to enter through inferior clear cornea Provisc was injected into the anterior chamber an additional Weck-Cel sponges used to fixate the globe and a 2.75 mm keratome blade was used in a stepwise fashion through temporal clear cornea additional Viscoat was injected and a bent 25-gauge needle was used to incise the anterior capsule and a continuous tear curvilinear capsulorrhexis was formed. The anterior capsule was removed with the capsule forceps and BSS was used to hydrodissect and hydrodelineate the nucleus the nucleus was noted to partially elevate out of the capsular bag. The phacoemulsification unit was then used to sculpt a central trough the nucleus was divided using the snapper hook the nucleus was rotated and divided into 3 quadrants with a snapper hook and all nuclear fragments were removed from the eye. The irrigation aspiration device was then used to strip cortical fibrous and the posterior capsule the cortical fibers and epinucleus all removed and the posterior capsule remained intact. Provisc was injected in the anterior chamber. The intraocular lens implant was examined and noted to have no defects the lens was an Alcon AcrySof SN 60 WF IQ lens a 22.5 diopter lens SN #[id number] the lens is placed in the lens  injector and injected and positioned with a Kuglen hook Miochol was injected to constrict the pupil the eye was pressurized and there being no leakage all instruments were removed from the eye topical TobraDex ointment was applied to the eye a patch and [place] U. were placed and the patient returned to recovery area in stable condition. [name] Junior M.D.

## 2013-10-29 NOTE — Transfer of Care (Signed)
Immediate Anesthesia Transfer of Care Note  Patient: [name]  Procedure(s) Performed: Procedure(s): LEFT CATARACT EXTRACTION PHACO AND INTRAOCULAR LENS PLACEMENT ([facility]) (Left)  Patient Location: PACU  Anesthesia Type:MAC  Level of Consciousness: awake, alert , oriented and patient cooperative  Airway & Oxygen Therapy: Patient Spontanous Breathing and Patient connected to nasal cannula oxygen  Post-op Assessment: Report given to PACU RN, Post -op Vital signs reviewed and stable and Patient moving all extremities  Post vital signs: Reviewed and stable  Complications: No apparent anesthesia complications

## 2013-10-29 NOTE — H&P (View-Only) (Signed)
History & Physical:   DATE:10-07-13     NAME:  [name], [name]     [id number]       HISTORY OF PRESENT ILLNESS: Chief Complaints:Patient notice vision decrease OS  Pre-Op cataract   HPI: EYES: Reports symptoms of vision disturbances .   difficulty reading news paper  peoples faces are not clear  Diabetes last HgA1c 5.7     LOCATION:   LEFT EYE        QUALITY/COURSE:   Reports condition is worsening.        INTENSITY/SEVERITY:    Reports measurement ( or degree) as moderate.      DURATION:   Reports the general length of symptoms to be months.     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, left eye   ICD10: H25.12  ICD9:   Onset: 10/24/2013 14:58  Initial Date:    Chronic angle-closure glaucoma   ICD10: H40.229  ICD9: 365.23  Onset: 10/07/2013 10:21  Initial Date:    Macular hole   ICD10: H35.349 ICD9: 362.54 Onset: 10/07/2012 11:18 Initial Date:    Macular edema, diabetic   ICD10:   ICD9: 362.07  Onset: 10/07/2012 11:18  Initial Date:    Benign hypertension   ICD10: I10  ICD9: 401.1  Onset:   Initial Date:    Diabetes - Type 2   ICD10: E11.9  Onset:   Initial Date:   ICD9: 250.00 250.50  Hypertensive retinopathy   ICD10: H35.039  ICD9: 362.11  Onset:   Initial Date:     Hyperthyroidism   ICD10: E05.90  ICD9: 242.90  Onset:   Initial Date:      Dry eye syndrome   ICD10: H04.129  ICD9: 375.15  Onset:   Initial Date:  SURGERIES: PPV with C3F8   OCt 28,2014 OS   MEDICATIONS: Ciloxan (Ciprofloxacin) Solution: 0.3% solution SIG-  1 gtt OS BID  REVIEW OF SYSTEMS: ROS:   GEN- Constitutional: HENT: GEN - Endocrine: Reports symptoms of diabetes.    thyroid disease LUNGS/Respiratory:  HEART/Cardiovascular: Reports symptoms of hypertension, heart trouble.    ABD/Gastrointestinal:  Musculoskeletal (BJE): +++++      knee pain or problems   NEURO/Neurological: PSYCH/Psychiatric:  Normal  TOBACCO: No exposure to tobacco.    :  SOCIAL HISTORY: reetired  FAMILY  HISTORY:Family History - 1st Degree Relatives:  Mother dead.    ALLERGIES: Drug Allergies.  No Known.   Starter - Allergies - Summary:  PHYSICAL EXAMINATION: VS: BMI: 38.7.  BP: 110/70.  H: 65.00 in.  RR: 20 /min.  W: 232lbs 0oz.    Va     OD:cc 20/25+1 PH 20/NI OS:cc 20/400 lines look zig zag PH 20/200 moving  EYEGLASSES:  OD:+2.00+0.50x162                                              OS:+1.00+0.75x014 ADD:+2.50  MR 01/26/2012 13:01   OD: OS +!.50  +0.75 X 30    20/200 ADD Auto Refraction:04/08/2013 10:26  OD: OS:-1.25+0.75x018  MR:10/07/2013 10:03 Dr. Venetia Maxon  OD: OS: -1.75 20/100+  MR:10/07/2013 08:53  K's OD:43.25 43.75 OS:43.50 43.50  Motility: full  PUPILS: 50mm - MG  EYELIDS & OCULAR ADNEXA: normal  SLE: Conjunctiva: quiet  Cornea: arcus OU    Anterior Chamber;  deep and quiet  OU  Iris:  PERIPHERAL  IRIDECTOMY   open OU  Lens: +2   nuclear  sclerosis  OD,  +3  nuclear  sclerosis  OS   Vitreous:  CCT:  Ta   in mmHg    OD 18    OS  15 Time:10/07/2013 10:15  Dilation:    Fundus:   optic nerve: OD:    rim intact temporal discoloration                                               OS: temporal discoloration and flattened 45% cup   Macula:       OD:   clear                                                  OS: flat    Vessels:narrrow arteroles.  Periphery: nornal Exam: GENERAL: Appearance: HEAD, EARS, NOSE AND THROAT: Ears-Nose (external) Inspection: Externally, nose and ears are normal in appearance and without scars, lesions, or nodules.      Hearing assessment shows no problems with normal conversation.      LUNGS and RESPIRATORY: Lung auscultation elicits no wheezing, rhonci, rales or rubs and with equal breath sounds.    Respiratory effort described as breathing is unlabored and chest movement is symmetrical.    HEART (Cardiovascular): Heart auscultation discovers regular rate and rhythm; no murmur, gallop or rub. Normal heart sounds.     ABDOMEN (Gastrointestinal): Mass/Tenderness Exam: Neither are present.     MUSCULOSKELETAL (BJE): Inspection-Palpation: No major bone, joint, tendon, or muscle changes.      NEUROLOGICAL: Alert and oriented. No major deficits of coordination or sensation.      PSYCHIATRIC: Insight and judgment appear  both to be intact and appropriate.    Mood and affect are described as normal mood and full affect.    SKIN: Skin Inspection: No rashes or lesions  ADMITTING DIAGNOSIS: Age-related nuclear cataract, left eye   ICD10: H25.12  ICD9:    Chronic angle-closure glaucoma   ICD10: H40.229  ICD9: 365.23  Onset: 10/07/2013 10:21   Macular hole   ICD10: H35.349 ICD9: 362.54 Onset: 10/07/2012 11:18 Initial Date:    Macular edema, diabetic   ICD10:   ICD9: 362.07  Onset: 10/07/2012 11:18  Initial Date:    Benign hypertension   ICD10: I10  ICD9: 401.1   Diabetes - Type 2   ICD10: E11.9  250.50  Hypertensive retinopathy   ICD10: H35.039  ICD9: 362.11    Hyperthyroidism   ICD10: E05.90  ICD9: 242.90     Dry eye syndrome   ICD10: H04.129  ICD9: 375.15  SURGICAL TREATMENT PLAN: phaco emulsion cataract extraction   w  intraocular lens implant  OS  cleared by retina     Risk and benefits of surgery have been reviewed with the patient and the patient agrees to proceed with the surgical procedure.     ___________________________ [name], [name]. Cerebrovascular accident (CVA or stroke]   ICD10: I63.9  ICD9: 436  1998 Insertion of heart pacemaker  #[id number]  Related Dxs-  Modifiers-   2008   Starter - Inactive Problems:

## 2013-11-01 ENCOUNTER — Encounter (HOSPITAL_COMMUNITY): Payer: Self-pay | Admitting: Ophthalmology

## 2013-11-14 ENCOUNTER — Encounter (HOSPITAL_COMMUNITY): Payer: Self-pay | Admitting: Ophthalmology

## 2013-11-20 DIAGNOSIS — M25569 Pain in unspecified knee: Secondary | ICD-10-CM | POA: Insufficient documentation

## 2013-11-20 DIAGNOSIS — M179 Osteoarthritis of knee, unspecified: Secondary | ICD-10-CM | POA: Insufficient documentation

## 2013-11-20 DIAGNOSIS — M1710 Unilateral primary osteoarthritis, unspecified knee: Secondary | ICD-10-CM | POA: Insufficient documentation

## 2014-01-20 ENCOUNTER — Encounter (INDEPENDENT_AMBULATORY_CARE_PROVIDER_SITE_OTHER): Payer: Commercial Managed Care - HMO | Admitting: Ophthalmology

## 2014-01-20 DIAGNOSIS — E11319 Type 2 diabetes mellitus with unspecified diabetic retinopathy without macular edema: Secondary | ICD-10-CM | POA: Diagnosis not present

## 2014-01-20 DIAGNOSIS — H35033 Hypertensive retinopathy, bilateral: Secondary | ICD-10-CM

## 2014-01-20 DIAGNOSIS — I10 Essential (primary) hypertension: Secondary | ICD-10-CM | POA: Diagnosis not present

## 2014-01-20 DIAGNOSIS — H43813 Vitreous degeneration, bilateral: Secondary | ICD-10-CM | POA: Diagnosis not present

## 2014-01-20 DIAGNOSIS — H35342 Macular cyst, hole, or pseudohole, left eye: Secondary | ICD-10-CM | POA: Diagnosis not present

## 2014-01-21 DIAGNOSIS — R51 Headache: Secondary | ICD-10-CM | POA: Diagnosis not present

## 2014-01-21 DIAGNOSIS — S0990XA Unspecified injury of head, initial encounter: Secondary | ICD-10-CM | POA: Diagnosis not present

## 2014-01-26 ENCOUNTER — Ambulatory Visit: Payer: Medicare HMO

## 2014-01-30 DIAGNOSIS — Z7901 Long term (current) use of anticoagulants: Secondary | ICD-10-CM | POA: Diagnosis not present

## 2014-02-19 DIAGNOSIS — I498 Other specified cardiac arrhythmias: Secondary | ICD-10-CM | POA: Diagnosis not present

## 2014-02-24 DIAGNOSIS — R233 Spontaneous ecchymoses: Secondary | ICD-10-CM | POA: Diagnosis not present

## 2014-03-01 IMAGING — CR DG CHEST 2V
2 series · 2 of 2 positions shown · non-contrast
Comparison: 06/02/2011

CLINICAL DATA: Preoperative evaluation for ocular surgery

EXAM:
CHEST  2 VIEW

[w chest pa]
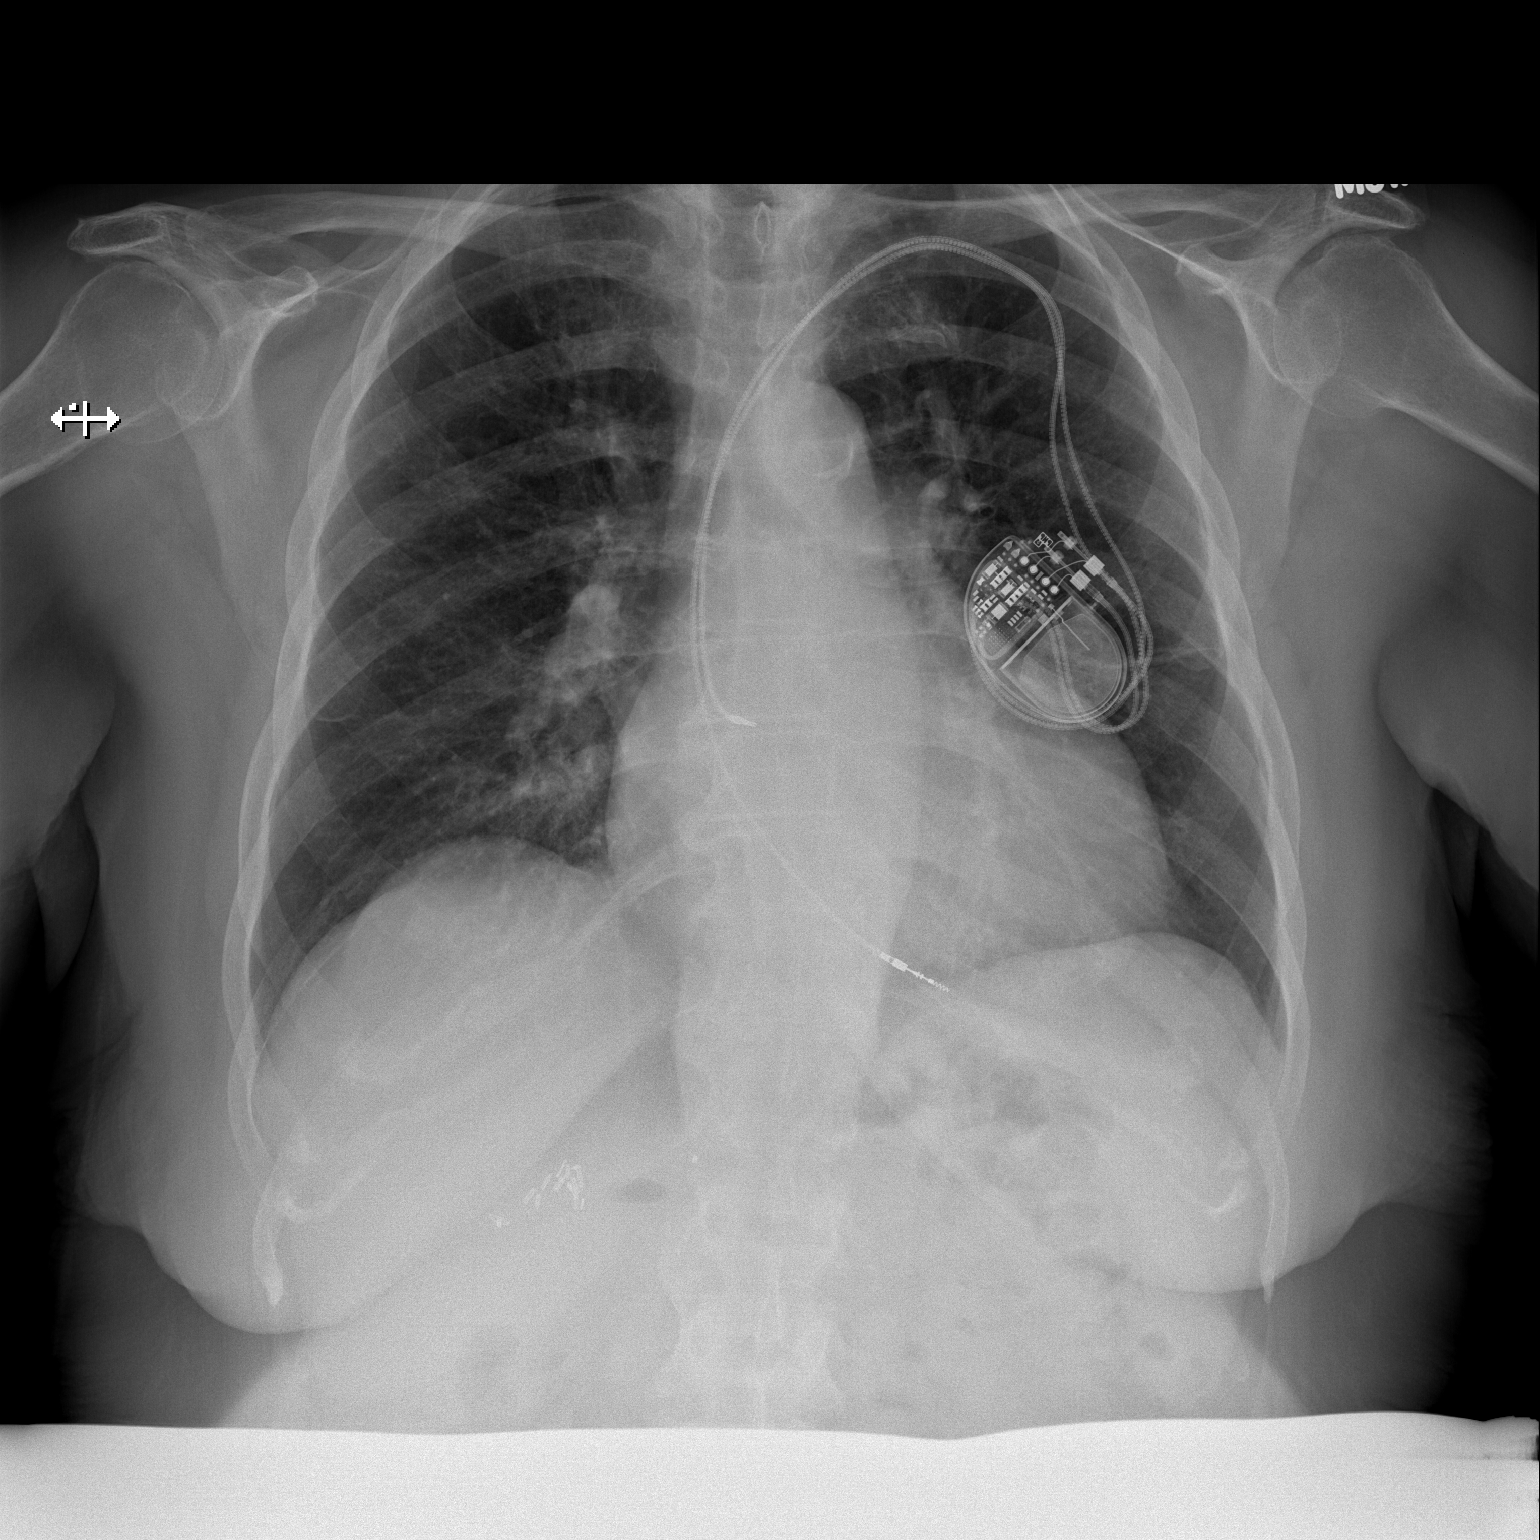

[w chest lat]
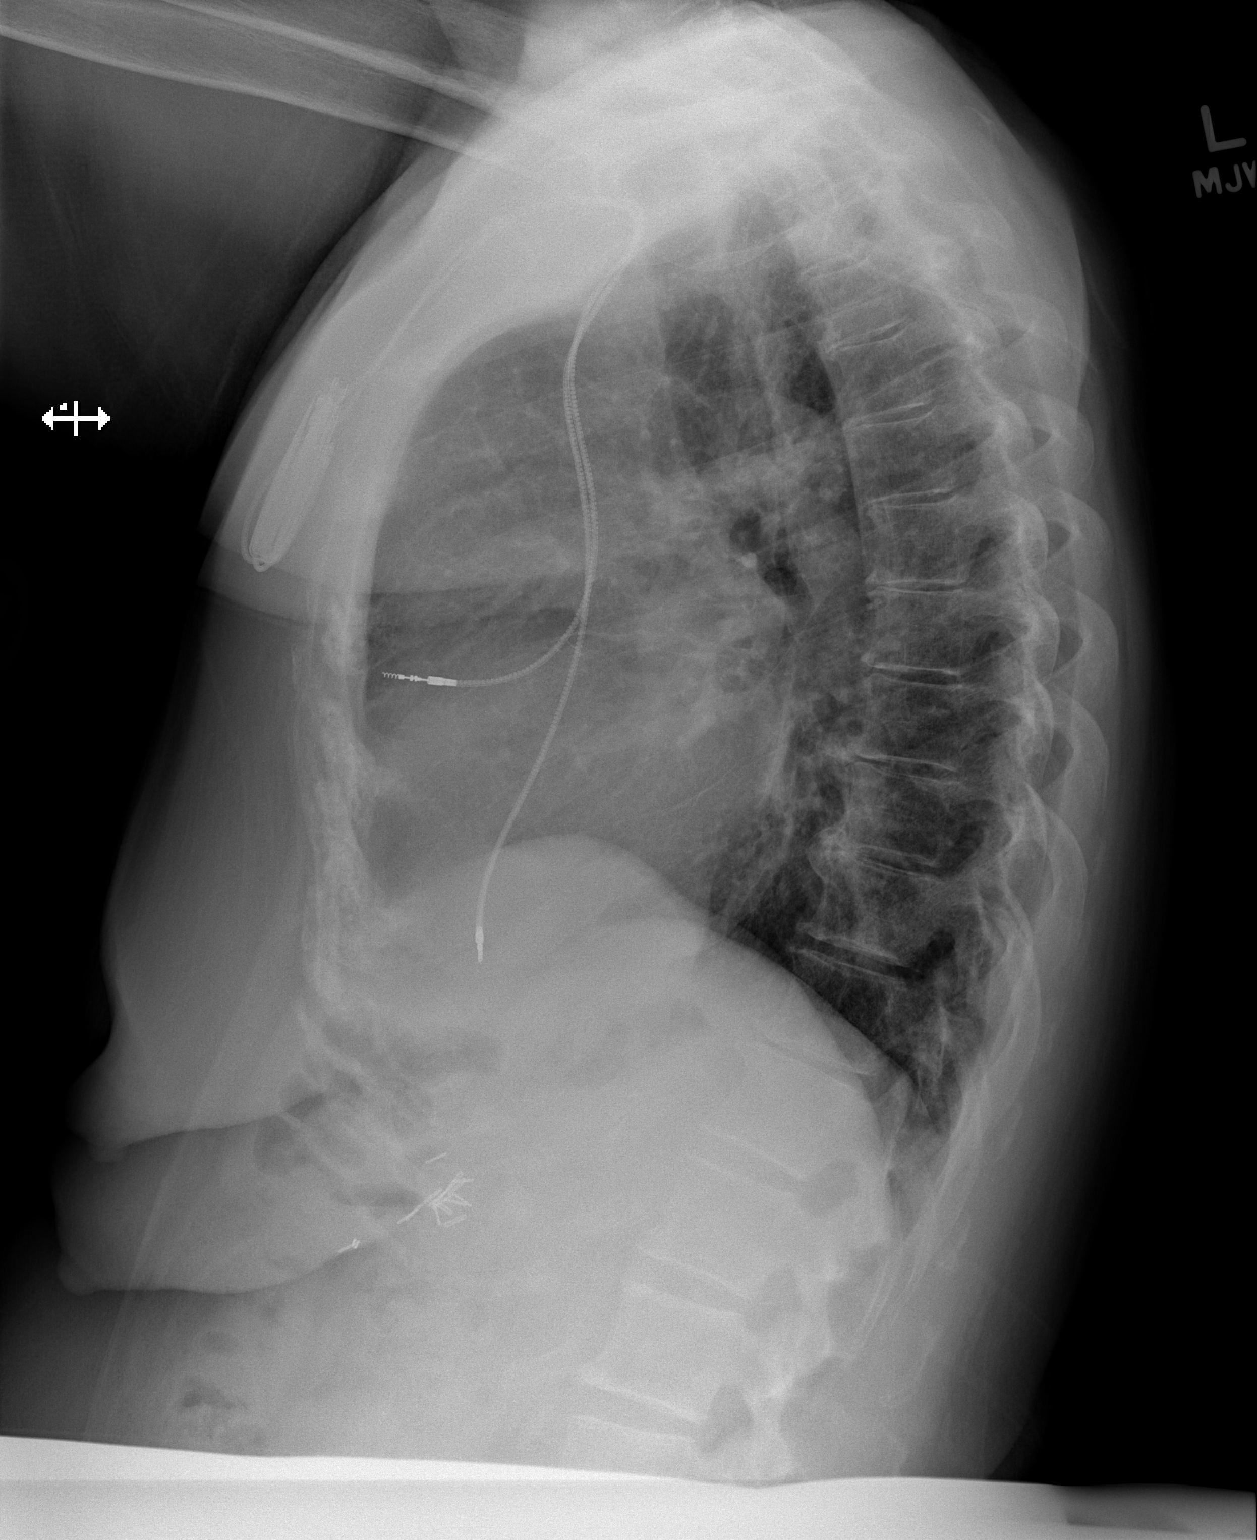

[2 of 2 positions shown; findings below may reference images not displayed]

FINDINGS: The cardiac shadow is stable. A pacing device is again seen. The
lungs are clear bilaterally. Mild degenerative change of the
thoracic spine is noted.
IMPRESSION: No acute abnormality noted.

## 2014-03-10 DIAGNOSIS — Z7901 Long term (current) use of anticoagulants: Secondary | ICD-10-CM | POA: Diagnosis not present

## 2014-03-17 DIAGNOSIS — I480 Paroxysmal atrial fibrillation: Secondary | ICD-10-CM | POA: Diagnosis not present

## 2014-03-17 DIAGNOSIS — E039 Hypothyroidism, unspecified: Secondary | ICD-10-CM | POA: Diagnosis not present

## 2014-03-17 DIAGNOSIS — Z79899 Other long term (current) drug therapy: Secondary | ICD-10-CM | POA: Diagnosis not present

## 2014-03-17 DIAGNOSIS — T148 Other injury of unspecified body region: Secondary | ICD-10-CM | POA: Diagnosis not present

## 2014-03-17 DIAGNOSIS — E785 Hyperlipidemia, unspecified: Secondary | ICD-10-CM | POA: Diagnosis not present

## 2014-03-17 DIAGNOSIS — I10 Essential (primary) hypertension: Secondary | ICD-10-CM | POA: Diagnosis not present

## 2014-03-17 DIAGNOSIS — E119 Type 2 diabetes mellitus without complications: Secondary | ICD-10-CM | POA: Diagnosis not present

## 2014-04-08 DIAGNOSIS — Z7901 Long term (current) use of anticoagulants: Secondary | ICD-10-CM | POA: Diagnosis not present

## 2014-04-21 DIAGNOSIS — H25811 Combined forms of age-related cataract, right eye: Secondary | ICD-10-CM | POA: Diagnosis not present

## 2014-04-21 DIAGNOSIS — H26492 Other secondary cataract, left eye: Secondary | ICD-10-CM | POA: Diagnosis not present

## 2014-04-21 DIAGNOSIS — E119 Type 2 diabetes mellitus without complications: Secondary | ICD-10-CM | POA: Diagnosis not present

## 2014-05-13 DIAGNOSIS — Z7901 Long term (current) use of anticoagulants: Secondary | ICD-10-CM | POA: Diagnosis not present

## 2014-05-21 DIAGNOSIS — I498 Other specified cardiac arrhythmias: Secondary | ICD-10-CM | POA: Diagnosis not present

## 2014-05-21 DIAGNOSIS — Z45010 Encounter for checking and testing of cardiac pacemaker pulse generator [battery]: Secondary | ICD-10-CM | POA: Diagnosis not present

## 2014-06-12 DIAGNOSIS — Z7901 Long term (current) use of anticoagulants: Secondary | ICD-10-CM | POA: Diagnosis not present

## 2014-07-08 DIAGNOSIS — Z7901 Long term (current) use of anticoagulants: Secondary | ICD-10-CM | POA: Diagnosis not present

## 2014-07-29 DIAGNOSIS — M1611 Unilateral primary osteoarthritis, right hip: Secondary | ICD-10-CM | POA: Diagnosis not present

## 2014-07-29 DIAGNOSIS — M25551 Pain in right hip: Secondary | ICD-10-CM | POA: Diagnosis not present

## 2014-08-13 DIAGNOSIS — Z950 Presence of cardiac pacemaker: Secondary | ICD-10-CM | POA: Insufficient documentation

## 2014-08-13 DIAGNOSIS — I4891 Unspecified atrial fibrillation: Secondary | ICD-10-CM | POA: Diagnosis not present

## 2014-08-13 DIAGNOSIS — Z45010 Encounter for checking and testing of cardiac pacemaker pulse generator [battery]: Secondary | ICD-10-CM | POA: Diagnosis not present

## 2014-08-24 ENCOUNTER — Ambulatory Visit (INDEPENDENT_AMBULATORY_CARE_PROVIDER_SITE_OTHER): Payer: Medicare HMO | Admitting: Ophthalmology

## 2014-09-04 DIAGNOSIS — E785 Hyperlipidemia, unspecified: Secondary | ICD-10-CM | POA: Diagnosis not present

## 2014-09-04 DIAGNOSIS — I10 Essential (primary) hypertension: Secondary | ICD-10-CM | POA: Diagnosis not present

## 2014-09-04 DIAGNOSIS — Z7901 Long term (current) use of anticoagulants: Secondary | ICD-10-CM | POA: Diagnosis not present

## 2014-09-04 DIAGNOSIS — Z79899 Other long term (current) drug therapy: Secondary | ICD-10-CM | POA: Diagnosis not present

## 2014-09-04 DIAGNOSIS — K219 Gastro-esophageal reflux disease without esophagitis: Secondary | ICD-10-CM | POA: Diagnosis not present

## 2014-09-04 DIAGNOSIS — E039 Hypothyroidism, unspecified: Secondary | ICD-10-CM | POA: Diagnosis not present

## 2014-09-04 DIAGNOSIS — E119 Type 2 diabetes mellitus without complications: Secondary | ICD-10-CM | POA: Diagnosis not present

## 2014-10-05 DIAGNOSIS — Z7901 Long term (current) use of anticoagulants: Secondary | ICD-10-CM | POA: Diagnosis not present

## 2014-10-21 DIAGNOSIS — Z950 Presence of cardiac pacemaker: Secondary | ICD-10-CM | POA: Diagnosis not present

## 2014-10-21 DIAGNOSIS — I10 Essential (primary) hypertension: Secondary | ICD-10-CM | POA: Insufficient documentation

## 2014-10-21 DIAGNOSIS — I482 Chronic atrial fibrillation, unspecified: Secondary | ICD-10-CM | POA: Insufficient documentation

## 2014-10-21 DIAGNOSIS — E119 Type 2 diabetes mellitus without complications: Secondary | ICD-10-CM | POA: Insufficient documentation

## 2014-10-21 DIAGNOSIS — E785 Hyperlipidemia, unspecified: Secondary | ICD-10-CM | POA: Insufficient documentation

## 2014-10-27 DIAGNOSIS — H25811 Combined forms of age-related cataract, right eye: Secondary | ICD-10-CM | POA: Diagnosis not present

## 2014-10-27 DIAGNOSIS — H35342 Macular cyst, hole, or pseudohole, left eye: Secondary | ICD-10-CM | POA: Diagnosis not present

## 2014-10-27 DIAGNOSIS — E119 Type 2 diabetes mellitus without complications: Secondary | ICD-10-CM | POA: Diagnosis not present

## 2014-10-27 DIAGNOSIS — H26492 Other secondary cataract, left eye: Secondary | ICD-10-CM | POA: Diagnosis not present

## 2014-11-02 DIAGNOSIS — Z01818 Encounter for other preprocedural examination: Secondary | ICD-10-CM | POA: Diagnosis not present

## 2014-11-02 DIAGNOSIS — I482 Chronic atrial fibrillation: Secondary | ICD-10-CM | POA: Diagnosis not present

## 2014-11-04 DIAGNOSIS — I4891 Unspecified atrial fibrillation: Secondary | ICD-10-CM | POA: Diagnosis not present

## 2014-11-04 DIAGNOSIS — J811 Chronic pulmonary edema: Secondary | ICD-10-CM | POA: Diagnosis not present

## 2014-11-04 DIAGNOSIS — I10 Essential (primary) hypertension: Secondary | ICD-10-CM | POA: Diagnosis not present

## 2014-11-05 DIAGNOSIS — I4891 Unspecified atrial fibrillation: Secondary | ICD-10-CM | POA: Diagnosis not present

## 2014-11-09 DIAGNOSIS — I4891 Unspecified atrial fibrillation: Secondary | ICD-10-CM | POA: Diagnosis not present

## 2014-11-10 DIAGNOSIS — I4891 Unspecified atrial fibrillation: Secondary | ICD-10-CM | POA: Diagnosis not present

## 2014-11-12 DIAGNOSIS — I482 Chronic atrial fibrillation: Secondary | ICD-10-CM | POA: Diagnosis not present

## 2014-11-12 DIAGNOSIS — Z7984 Long term (current) use of oral hypoglycemic drugs: Secondary | ICD-10-CM | POA: Diagnosis not present

## 2014-11-12 DIAGNOSIS — Z79899 Other long term (current) drug therapy: Secondary | ICD-10-CM | POA: Diagnosis not present

## 2014-11-12 DIAGNOSIS — I4891 Unspecified atrial fibrillation: Secondary | ICD-10-CM | POA: Diagnosis not present

## 2014-11-12 DIAGNOSIS — I509 Heart failure, unspecified: Secondary | ICD-10-CM | POA: Diagnosis not present

## 2014-11-12 DIAGNOSIS — Z45010 Encounter for checking and testing of cardiac pacemaker pulse generator [battery]: Secondary | ICD-10-CM | POA: Diagnosis not present

## 2014-11-12 DIAGNOSIS — I10 Essential (primary) hypertension: Secondary | ICD-10-CM | POA: Diagnosis not present

## 2014-11-12 DIAGNOSIS — E119 Type 2 diabetes mellitus without complications: Secondary | ICD-10-CM | POA: Diagnosis not present

## 2014-11-12 DIAGNOSIS — Z45018 Encounter for adjustment and management of other part of cardiac pacemaker: Secondary | ICD-10-CM | POA: Diagnosis not present

## 2014-11-12 DIAGNOSIS — E785 Hyperlipidemia, unspecified: Secondary | ICD-10-CM | POA: Diagnosis not present

## 2014-11-13 DIAGNOSIS — Z45018 Encounter for adjustment and management of other part of cardiac pacemaker: Secondary | ICD-10-CM | POA: Diagnosis not present

## 2014-11-13 DIAGNOSIS — I509 Heart failure, unspecified: Secondary | ICD-10-CM | POA: Diagnosis not present

## 2014-11-13 DIAGNOSIS — Z7984 Long term (current) use of oral hypoglycemic drugs: Secondary | ICD-10-CM | POA: Diagnosis not present

## 2014-11-13 DIAGNOSIS — Z45010 Encounter for checking and testing of cardiac pacemaker pulse generator [battery]: Secondary | ICD-10-CM | POA: Diagnosis not present

## 2014-11-13 DIAGNOSIS — I482 Chronic atrial fibrillation: Secondary | ICD-10-CM | POA: Diagnosis not present

## 2014-11-13 DIAGNOSIS — E119 Type 2 diabetes mellitus without complications: Secondary | ICD-10-CM | POA: Diagnosis not present

## 2014-11-13 DIAGNOSIS — I10 Essential (primary) hypertension: Secondary | ICD-10-CM | POA: Diagnosis not present

## 2014-11-13 DIAGNOSIS — Z79899 Other long term (current) drug therapy: Secondary | ICD-10-CM | POA: Diagnosis not present

## 2014-11-13 DIAGNOSIS — E785 Hyperlipidemia, unspecified: Secondary | ICD-10-CM | POA: Diagnosis not present

## 2014-11-18 DIAGNOSIS — E785 Hyperlipidemia, unspecified: Secondary | ICD-10-CM | POA: Diagnosis not present

## 2014-11-18 DIAGNOSIS — I10 Essential (primary) hypertension: Secondary | ICD-10-CM | POA: Diagnosis not present

## 2014-11-18 DIAGNOSIS — E119 Type 2 diabetes mellitus without complications: Secondary | ICD-10-CM | POA: Diagnosis not present

## 2014-11-18 DIAGNOSIS — I482 Chronic atrial fibrillation: Secondary | ICD-10-CM | POA: Diagnosis not present

## 2014-11-18 DIAGNOSIS — I4891 Unspecified atrial fibrillation: Secondary | ICD-10-CM | POA: Diagnosis not present

## 2014-11-18 DIAGNOSIS — Z950 Presence of cardiac pacemaker: Secondary | ICD-10-CM | POA: Diagnosis not present

## 2014-11-18 DIAGNOSIS — Z7901 Long term (current) use of anticoagulants: Secondary | ICD-10-CM | POA: Diagnosis not present

## 2014-11-25 DIAGNOSIS — Z7901 Long term (current) use of anticoagulants: Secondary | ICD-10-CM | POA: Diagnosis not present

## 2014-12-25 DIAGNOSIS — H25811 Combined forms of age-related cataract, right eye: Secondary | ICD-10-CM | POA: Diagnosis not present

## 2014-12-25 DIAGNOSIS — H40033 Anatomical narrow angle, bilateral: Secondary | ICD-10-CM | POA: Diagnosis not present

## 2014-12-25 DIAGNOSIS — H35372 Puckering of macula, left eye: Secondary | ICD-10-CM | POA: Diagnosis not present

## 2014-12-25 DIAGNOSIS — H26492 Other secondary cataract, left eye: Secondary | ICD-10-CM | POA: Diagnosis not present

## 2015-01-04 DIAGNOSIS — Z79899 Other long term (current) drug therapy: Secondary | ICD-10-CM | POA: Diagnosis not present

## 2015-01-04 DIAGNOSIS — E119 Type 2 diabetes mellitus without complications: Secondary | ICD-10-CM | POA: Diagnosis not present

## 2015-01-04 DIAGNOSIS — Z7901 Long term (current) use of anticoagulants: Secondary | ICD-10-CM | POA: Diagnosis not present

## 2015-01-04 DIAGNOSIS — E785 Hyperlipidemia, unspecified: Secondary | ICD-10-CM | POA: Diagnosis not present

## 2015-01-04 DIAGNOSIS — I480 Paroxysmal atrial fibrillation: Secondary | ICD-10-CM | POA: Diagnosis not present

## 2015-01-04 DIAGNOSIS — G4700 Insomnia, unspecified: Secondary | ICD-10-CM | POA: Diagnosis not present

## 2015-01-04 DIAGNOSIS — E039 Hypothyroidism, unspecified: Secondary | ICD-10-CM | POA: Diagnosis not present

## 2015-01-12 DIAGNOSIS — I4891 Unspecified atrial fibrillation: Secondary | ICD-10-CM | POA: Diagnosis not present

## 2015-01-12 DIAGNOSIS — G629 Polyneuropathy, unspecified: Secondary | ICD-10-CM | POA: Diagnosis not present

## 2015-01-12 DIAGNOSIS — Z7901 Long term (current) use of anticoagulants: Secondary | ICD-10-CM | POA: Diagnosis not present

## 2015-01-12 DIAGNOSIS — E119 Type 2 diabetes mellitus without complications: Secondary | ICD-10-CM | POA: Diagnosis not present

## 2015-01-12 DIAGNOSIS — H25811 Combined forms of age-related cataract, right eye: Secondary | ICD-10-CM | POA: Diagnosis not present

## 2015-01-12 DIAGNOSIS — Z7984 Long term (current) use of oral hypoglycemic drugs: Secondary | ICD-10-CM | POA: Diagnosis not present

## 2015-01-12 DIAGNOSIS — E039 Hypothyroidism, unspecified: Secondary | ICD-10-CM | POA: Diagnosis not present

## 2015-01-12 DIAGNOSIS — Z7982 Long term (current) use of aspirin: Secondary | ICD-10-CM | POA: Diagnosis not present

## 2015-01-12 DIAGNOSIS — I10 Essential (primary) hypertension: Secondary | ICD-10-CM | POA: Diagnosis not present

## 2015-01-20 DIAGNOSIS — Z7901 Long term (current) use of anticoagulants: Secondary | ICD-10-CM | POA: Diagnosis not present

## 2015-01-22 ENCOUNTER — Ambulatory Visit (INDEPENDENT_AMBULATORY_CARE_PROVIDER_SITE_OTHER): Payer: Commercial Managed Care - HMO | Admitting: Ophthalmology

## 2015-02-02 DIAGNOSIS — J069 Acute upper respiratory infection, unspecified: Secondary | ICD-10-CM | POA: Diagnosis not present

## 2015-02-12 DIAGNOSIS — H524 Presbyopia: Secondary | ICD-10-CM | POA: Diagnosis not present

## 2015-02-19 DIAGNOSIS — Z0100 Encounter for examination of eyes and vision without abnormal findings: Secondary | ICD-10-CM | POA: Diagnosis not present

## 2015-02-22 DIAGNOSIS — Z7901 Long term (current) use of anticoagulants: Secondary | ICD-10-CM | POA: Diagnosis not present

## 2015-03-22 DIAGNOSIS — Z7901 Long term (current) use of anticoagulants: Secondary | ICD-10-CM | POA: Diagnosis not present

## 2015-04-15 DIAGNOSIS — M7989 Other specified soft tissue disorders: Secondary | ICD-10-CM | POA: Diagnosis not present

## 2015-04-15 DIAGNOSIS — L03119 Cellulitis of unspecified part of limb: Secondary | ICD-10-CM | POA: Diagnosis not present

## 2015-04-22 DIAGNOSIS — Z7901 Long term (current) use of anticoagulants: Secondary | ICD-10-CM | POA: Diagnosis not present

## 2015-05-05 ENCOUNTER — Encounter: Payer: Self-pay | Admitting: Sports Medicine

## 2015-05-05 ENCOUNTER — Ambulatory Visit (INDEPENDENT_AMBULATORY_CARE_PROVIDER_SITE_OTHER): Payer: Commercial Managed Care - HMO | Admitting: Sports Medicine

## 2015-05-05 DIAGNOSIS — E119 Type 2 diabetes mellitus without complications: Secondary | ICD-10-CM | POA: Diagnosis not present

## 2015-05-05 DIAGNOSIS — M79675 Pain in left toe(s): Secondary | ICD-10-CM

## 2015-05-05 DIAGNOSIS — M79674 Pain in right toe(s): Secondary | ICD-10-CM | POA: Diagnosis not present

## 2015-05-05 DIAGNOSIS — I739 Peripheral vascular disease, unspecified: Secondary | ICD-10-CM | POA: Diagnosis not present

## 2015-05-05 DIAGNOSIS — L600 Ingrowing nail: Secondary | ICD-10-CM

## 2015-05-05 DIAGNOSIS — B351 Tinea unguium: Secondary | ICD-10-CM

## 2015-05-05 DIAGNOSIS — Z7901 Long term (current) use of anticoagulants: Secondary | ICD-10-CM | POA: Diagnosis not present

## 2015-05-05 NOTE — Patient Instructions (Signed)
Diabetes and Foot Care Diabetes may cause you to have problems because of poor blood supply (circulation) to your feet and legs. This may cause the skin on your feet to become thinner, break easier, and heal more slowly. Your skin may become dry, and the skin may peel and crack. You may also have nerve damage in your legs and feet causing decreased feeling in them. You may not notice minor injuries to your feet that could lead to infections or more serious problems. Taking care of your feet is one of the most important things you can do for yourself.  HOME CARE INSTRUCTIONS  Wear shoes at all times, even in the house. Do not go barefoot. Bare feet are easily injured.  Check your feet daily for blisters, cuts, and redness. If you cannot see the bottom of your feet, use a mirror or ask someone for help.  Wash your feet with warm water (do not use hot water) and mild soap. Then pat your feet and the areas between your toes until they are completely dry. Do not soak your feet as this can dry your skin.  Apply a moisturizing lotion or petroleum jelly (that does not contain alcohol and is unscented) to the skin on your feet and to dry, brittle toenails. Do not apply lotion between your toes.  Trim your toenails straight across. Do not dig under them or around the cuticle. File the edges of your nails with an emery board or nail file.  Do not cut corns or calluses or try to remove them with medicine.  Wear clean socks or stockings every day. Make sure they are not too tight. Do not wear knee-high stockings since they may decrease blood flow to your legs.  Wear shoes that fit properly and have enough cushioning. To break in new shoes, wear them for just a few hours a day. This prevents you from injuring your feet. Always look in your shoes before you put them on to be sure there are no objects inside.  Do not cross your legs. This may decrease the blood flow to your feet.  If you find a minor scrape,  cut, or break in the skin on your feet, keep it and the skin around it clean and dry. These areas may be cleansed with mild soap and water. Do not cleanse the area with peroxide, alcohol, or iodine.  When you remove an adhesive bandage, be sure not to damage the skin around it.  If you have a wound, look at it several times a day to make sure it is healing.  Do not use heating pads or hot water bottles. They may burn your skin. If you have lost feeling in your feet or legs, you may not know it is happening until it is too late.  Make sure your health care provider performs a complete foot exam at least annually or more often if you have foot problems. Report any cuts, sores, or bruises to your health care provider immediately. SEEK MEDICAL CARE IF:   You have an injury that is not healing.  You have cuts or breaks in the skin.  You have an ingrown nail.  You notice redness on your legs or feet.  You feel burning or tingling in your legs or feet.  You have pain or cramps in your legs and feet.  Your legs or feet are numb.  Your feet always feel cold. SEEK IMMEDIATE MEDICAL CARE IF:   There is increasing redness,   swelling, or pain in or around a wound.  There is a red line that goes up your leg.  Pus is coming from a wound.  You develop a fever or as directed by your health care provider.  You notice a bad smell coming from an ulcer or wound.   This information is not intended to replace advice given to you by your health care provider. Make sure you discuss any questions you have with your health care provider.   Document Released: 12/31/1999 Document Revised: 09/04/2012 Document Reviewed: 06/11/2012 [employer] Interactive Patient Education 2016 [employer]  

## 2015-05-05 NOTE — Progress Notes (Signed)
Patient ID: [name], female   DOB: December 30, 1940, 75 y.o.   MRN: [id number] Subjective: [name] is a 75 y.o. female patient with history of diabetes who presents to office today complaining of long, painful nails  while ambulating in shoes; unable to trim. Patient states that she goes for pedicures but sometimes the right big toe and left 2nd toe gets tender at the corners. Patient states the glucose reading this morning was 74 mg/dl. Patient denies any new changes in medication or new problems. Patient denies any new cramping, numbness, burning or tingling in the legs.  Patient Active Problem List   Diagnosis Date Noted  . Chronic atrial fibrillation ([place]) 10/21/2014  . Dyslipidemia 10/21/2014  . Essential (primary) hypertension 10/21/2014  . Type 2 diabetes mellitus ([place]) 10/21/2014  . Atrial fibrillation ([place]) 08/13/2014  . Artificial cardiac pacemaker 08/13/2014  . Arthritis of knee, degenerative 11/20/2013  . Gonalgia 11/20/2013  . Macular hole, left eye 10/23/2012   Current Outpatient Prescriptions on File Prior to Visit  Medication Sig Dispense Refill  . acetaminophen (TYLENOL) 500 MG tablet Take 1,000 mg by mouth at bedtime as needed (knee pain).     Marland Kitchen amLODipine (NORVASC) 5 MG tablet Take 5 mg by mouth daily.    Marland Kitchen aspirin EC 81 MG tablet Take 81 mg by mouth daily.    . benazepril (LOTENSIN) 40 MG tablet Take 40 mg by mouth daily.    . Cholecalciferol (VITAMIN D) 2000 UNITS CAPS Take 2,000 Units by mouth daily.    . ciprofloxacin (CILOXAN) 0.3 % ophthalmic solution Place 1 drop into the left eye 2 (two) times daily.    Marland Kitchen estradiol (ESTRACE) 0.5 MG tablet Take 0.5 mg by mouth daily.    Marland Kitchen gabapentin (NEURONTIN) 300 MG capsule Take 600 mg by mouth at bedtime.    Marland Kitchen glipiZIDE (GLUCOTROL XL) 5 MG 24 hr tablet Take 5 mg by mouth daily.    . hydrochlorothiazide (MICROZIDE) 12.5 MG capsule Take 12.5 mg by mouth at bedtime.     Marland Kitchen levothyroxine (SYNTHROID, LEVOTHROID) 100 MCG  tablet Take 100 mcg by mouth daily before breakfast.    . lovastatin (MEVACOR) 40 MG tablet Take 40 mg by mouth 2 (two) times daily.    . metFORMIN (GLUCOPHAGE) 500 MG tablet Take 500 mg by mouth daily with breakfast.    . metoprolol succinate (TOPROL-XL) 50 MG 24 hr tablet Take 50 mg by mouth daily. Take with or immediately following a meal.    . Nepafenac (ILEVRO) 0.3 % SUSP Place 1 drop into the left eye once.    Marland Kitchen omeprazole (PRILOSEC) 20 MG capsule Take 20 mg by mouth daily.    . potassium chloride (K-DUR,KLOR-CON) 10 MEQ tablet Take 10 mEq by mouth 2 (two) times daily.    Marland Kitchen tiZANidine (ZANAFLEX) 4 MG tablet Take 2 mg by mouth at bedtime.    Marland Kitchen warfarin (COUMADIN) 3 MG tablet Take 3-6 mg by mouth daily at 6 PM. Take 2 tablets (6 mg) by mouth on Monday, Wednsday, Friday, Saturday and Sunday, take 1 tablet (3 mg) on Tuesday and Thursday     No current facility-administered medications on file prior to visit.   No Known Allergies  No results found for this or any previous visit (from the past 2160 hour(s)).  Objective: General: Patient is awake, alert, and oriented x 3 and in no acute distress.  Integument: Skin is warm, dry and supple bilateral. Nails are minimally tender, long, thickened  and slightly incurvated at right hallux and left 2nd toe medial margins, all other nails free of ingrowing and thickness. No signs of infection. No open lesions or preulcerative lesions present bilateral. Remaining integument unremarkable.  Vasculature:  Dorsalis Pedis pulse 1/4 bilateral. Posterior Tibial pulse  1/4 bilateral.  Capillary fill time <3 sec 1-5 bilateral. No hair growth to the level of the digits. Temperature gradient within normal limits. + varicosities present bilateral. Trace edema present bilateral.   Neurology: The patient has intact sensation measured with a 5.07/10g Semmes Weinstein Monofilament at all pedal sites bilateral . Vibratory sensation diminished bilateral with tuning fork.  No Babinski sign present bilateral.   Musculoskeletal: No symptomatic gross pedal deformities noted bilateral. Muscular strength 5/5 in all lower extremity muscular groups bilateral without pain on range of motion . No tenderness with calf compression bilateral.  Assessment and Plan: Problem List Items Addressed This Visit    None    Visit Diagnoses    Ingrowing nail    -  Primary    Dermatophytosis of nail        Toe pain, bilateral        Diabetes mellitus without complication (HCC)        PVD (peripheral vascular disease) (HCC)        Relevant Medications    metoprolol (LOPRESSOR) 50 MG tablet    furosemide (LASIX) 40 MG tablet    enoxaparin (LOVENOX) 100 MG/ML injection    Anticoagulant long-term use          -Examined patient. -Discussed and educated patient on diabetic foot care, especially with  regards to the vascular, neurological and musculoskeletal systems.  -Stressed the importance of good glycemic control and the detriment of not  controlling glucose levels in relation to the foot. -Discussed treatment options for ingrowing nails without infection -Patient declined nail procedure at this time -Mechanically debrided all offending borders at right hallux and left 2nd toe bilateral using sterile nail nipper and filed with dremel without incident  -Recommend nail softener and epsom salt soaks as needed -Answered all patient questions -Patient to return as needed for at risk foot care -Patient advised to call the office if any problems or questions arise in the  Meantime.  Landis Martins, DPM

## 2015-05-06 DIAGNOSIS — Z7901 Long term (current) use of anticoagulants: Secondary | ICD-10-CM | POA: Diagnosis not present

## 2015-05-06 DIAGNOSIS — E119 Type 2 diabetes mellitus without complications: Secondary | ICD-10-CM | POA: Diagnosis not present

## 2015-05-06 DIAGNOSIS — E039 Hypothyroidism, unspecified: Secondary | ICD-10-CM | POA: Diagnosis not present

## 2015-05-06 DIAGNOSIS — Z1389 Encounter for screening for other disorder: Secondary | ICD-10-CM | POA: Diagnosis not present

## 2015-05-06 DIAGNOSIS — Z0000 Encounter for general adult medical examination without abnormal findings: Secondary | ICD-10-CM | POA: Diagnosis not present

## 2015-05-06 DIAGNOSIS — I10 Essential (primary) hypertension: Secondary | ICD-10-CM | POA: Diagnosis not present

## 2015-05-06 DIAGNOSIS — Z79899 Other long term (current) drug therapy: Secondary | ICD-10-CM | POA: Diagnosis not present

## 2015-05-06 DIAGNOSIS — Z1231 Encounter for screening mammogram for malignant neoplasm of breast: Secondary | ICD-10-CM | POA: Diagnosis not present

## 2015-05-06 DIAGNOSIS — E785 Hyperlipidemia, unspecified: Secondary | ICD-10-CM | POA: Diagnosis not present

## 2015-05-19 DIAGNOSIS — R748 Abnormal levels of other serum enzymes: Secondary | ICD-10-CM | POA: Diagnosis not present

## 2015-05-25 DIAGNOSIS — Z7901 Long term (current) use of anticoagulants: Secondary | ICD-10-CM | POA: Diagnosis not present

## 2015-05-25 DIAGNOSIS — I482 Chronic atrial fibrillation: Secondary | ICD-10-CM | POA: Diagnosis not present

## 2015-05-25 DIAGNOSIS — I110 Hypertensive heart disease with heart failure: Secondary | ICD-10-CM | POA: Diagnosis not present

## 2015-05-25 DIAGNOSIS — Z950 Presence of cardiac pacemaker: Secondary | ICD-10-CM | POA: Diagnosis not present

## 2015-05-27 DIAGNOSIS — R748 Abnormal levels of other serum enzymes: Secondary | ICD-10-CM | POA: Diagnosis not present

## 2015-05-27 DIAGNOSIS — R945 Abnormal results of liver function studies: Secondary | ICD-10-CM | POA: Diagnosis not present

## 2015-05-27 DIAGNOSIS — I498 Other specified cardiac arrhythmias: Secondary | ICD-10-CM | POA: Diagnosis not present

## 2015-05-27 DIAGNOSIS — Z45018 Encounter for adjustment and management of other part of cardiac pacemaker: Secondary | ICD-10-CM | POA: Diagnosis not present

## 2015-06-02 DIAGNOSIS — Z1212 Encounter for screening for malignant neoplasm of rectum: Secondary | ICD-10-CM | POA: Diagnosis not present

## 2015-06-02 DIAGNOSIS — Z1211 Encounter for screening for malignant neoplasm of colon: Secondary | ICD-10-CM | POA: Diagnosis not present

## 2015-06-09 DIAGNOSIS — Z7901 Long term (current) use of anticoagulants: Secondary | ICD-10-CM | POA: Diagnosis not present

## 2015-07-12 DIAGNOSIS — Z7901 Long term (current) use of anticoagulants: Secondary | ICD-10-CM | POA: Diagnosis not present

## 2015-08-11 DIAGNOSIS — Z7901 Long term (current) use of anticoagulants: Secondary | ICD-10-CM | POA: Diagnosis not present

## 2015-08-24 DIAGNOSIS — H353121 Nonexudative age-related macular degeneration, left eye, early dry stage: Secondary | ICD-10-CM | POA: Diagnosis not present

## 2015-08-24 DIAGNOSIS — H26491 Other secondary cataract, right eye: Secondary | ICD-10-CM | POA: Diagnosis not present

## 2015-08-30 DIAGNOSIS — Z950 Presence of cardiac pacemaker: Secondary | ICD-10-CM | POA: Diagnosis not present

## 2015-09-01 DIAGNOSIS — H26491 Other secondary cataract, right eye: Secondary | ICD-10-CM | POA: Diagnosis not present

## 2015-09-09 DIAGNOSIS — G4700 Insomnia, unspecified: Secondary | ICD-10-CM | POA: Diagnosis not present

## 2015-09-09 DIAGNOSIS — E119 Type 2 diabetes mellitus without complications: Secondary | ICD-10-CM | POA: Diagnosis not present

## 2015-09-09 DIAGNOSIS — Z7901 Long term (current) use of anticoagulants: Secondary | ICD-10-CM | POA: Diagnosis not present

## 2015-09-09 DIAGNOSIS — I480 Paroxysmal atrial fibrillation: Secondary | ICD-10-CM | POA: Diagnosis not present

## 2015-09-09 DIAGNOSIS — Z79899 Other long term (current) drug therapy: Secondary | ICD-10-CM | POA: Diagnosis not present

## 2015-09-09 DIAGNOSIS — E785 Hyperlipidemia, unspecified: Secondary | ICD-10-CM | POA: Diagnosis not present

## 2015-09-09 DIAGNOSIS — E039 Hypothyroidism, unspecified: Secondary | ICD-10-CM | POA: Diagnosis not present

## 2015-09-09 DIAGNOSIS — I10 Essential (primary) hypertension: Secondary | ICD-10-CM | POA: Diagnosis not present

## 2015-10-12 DIAGNOSIS — Z7901 Long term (current) use of anticoagulants: Secondary | ICD-10-CM | POA: Diagnosis not present

## 2015-10-29 DIAGNOSIS — Z7901 Long term (current) use of anticoagulants: Secondary | ICD-10-CM | POA: Diagnosis not present

## 2015-11-25 DIAGNOSIS — Z45018 Encounter for adjustment and management of other part of cardiac pacemaker: Secondary | ICD-10-CM | POA: Diagnosis not present

## 2015-11-25 DIAGNOSIS — I442 Atrioventricular block, complete: Secondary | ICD-10-CM | POA: Diagnosis not present

## 2015-11-29 DIAGNOSIS — Z7901 Long term (current) use of anticoagulants: Secondary | ICD-10-CM | POA: Diagnosis not present

## 2015-12-13 DIAGNOSIS — Z7901 Long term (current) use of anticoagulants: Secondary | ICD-10-CM | POA: Diagnosis not present

## 2016-01-04 DIAGNOSIS — E785 Hyperlipidemia, unspecified: Secondary | ICD-10-CM | POA: Diagnosis not present

## 2016-01-04 DIAGNOSIS — I10 Essential (primary) hypertension: Secondary | ICD-10-CM | POA: Diagnosis not present

## 2016-01-04 DIAGNOSIS — G4700 Insomnia, unspecified: Secondary | ICD-10-CM | POA: Diagnosis not present

## 2016-01-04 DIAGNOSIS — E039 Hypothyroidism, unspecified: Secondary | ICD-10-CM | POA: Diagnosis not present

## 2016-01-04 DIAGNOSIS — Z79899 Other long term (current) drug therapy: Secondary | ICD-10-CM | POA: Diagnosis not present

## 2016-01-04 DIAGNOSIS — Z7901 Long term (current) use of anticoagulants: Secondary | ICD-10-CM | POA: Diagnosis not present

## 2016-01-04 DIAGNOSIS — I480 Paroxysmal atrial fibrillation: Secondary | ICD-10-CM | POA: Diagnosis not present

## 2016-01-04 DIAGNOSIS — E119 Type 2 diabetes mellitus without complications: Secondary | ICD-10-CM | POA: Diagnosis not present

## 2016-01-12 DIAGNOSIS — S61207A Unspecified open wound of left little finger without damage to nail, initial encounter: Secondary | ICD-10-CM | POA: Diagnosis not present

## 2016-01-21 ENCOUNTER — Ambulatory Visit (INDEPENDENT_AMBULATORY_CARE_PROVIDER_SITE_OTHER): Payer: PPO

## 2016-01-21 ENCOUNTER — Encounter: Payer: Self-pay | Admitting: Sports Medicine

## 2016-01-21 ENCOUNTER — Ambulatory Visit (INDEPENDENT_AMBULATORY_CARE_PROVIDER_SITE_OTHER): Payer: PPO | Admitting: Sports Medicine

## 2016-01-21 DIAGNOSIS — Z7901 Long term (current) use of anticoagulants: Secondary | ICD-10-CM

## 2016-01-21 DIAGNOSIS — M19071 Primary osteoarthritis, right ankle and foot: Secondary | ICD-10-CM | POA: Diagnosis not present

## 2016-01-21 DIAGNOSIS — M779 Enthesopathy, unspecified: Secondary | ICD-10-CM

## 2016-01-21 DIAGNOSIS — I739 Peripheral vascular disease, unspecified: Secondary | ICD-10-CM

## 2016-01-21 DIAGNOSIS — B351 Tinea unguium: Secondary | ICD-10-CM | POA: Diagnosis not present

## 2016-01-21 DIAGNOSIS — L84 Corns and callosities: Secondary | ICD-10-CM

## 2016-01-21 DIAGNOSIS — M79671 Pain in right foot: Secondary | ICD-10-CM | POA: Diagnosis not present

## 2016-01-21 DIAGNOSIS — E119 Type 2 diabetes mellitus without complications: Secondary | ICD-10-CM

## 2016-01-21 MED ORDER — TRIAMCINOLONE ACETONIDE 10 MG/ML IJ SUSP: 10.0000 mg | Freq: Once | INTRAMUSCULAR | Status: DC

## 2016-01-21 NOTE — Progress Notes (Signed)
Patient ID: [name], female   DOB: 1941/01/09, 76 y.o.   MRN: [id number] Subjective: [name] is a 76 y.o. female patient with history of diabetes who presents to office today complaining of right foot pain since after Christmas. States that she did a lot of standing and walking with cooking and the outer side of her right foot below the base of the fifth toe is bothering her. Patient also states that she thinks she has some hard skin over the area and will like it to be looked at. Patient is also complaining of long, painful nails  while ambulating in shoes; unable to trim. Patient states that she goes for pedicures but sometimes the right big toe and left 2nd toe gets tender at the corners. Patient states the glucose reading this morning was "fine". Patient denies any new changes in medication or new problems. Patient denies any new cramping, numbness, burning or tingling in the legs.  Patient Active Problem List   Diagnosis Date Noted  . Chronic atrial fibrillation ([place]) 10/21/2014  . Dyslipidemia 10/21/2014  . Essential (primary) hypertension 10/21/2014  . Type 2 diabetes mellitus ([place]) 10/21/2014  . Atrial fibrillation ([place]) 08/13/2014  . Artificial cardiac pacemaker 08/13/2014  . Arthritis of knee, degenerative 11/20/2013  . Gonalgia 11/20/2013  . Macular hole, left eye 10/23/2012   Current Outpatient Prescriptions on File Prior to Visit  Medication Sig Dispense Refill  . acetaminophen (TYLENOL) 500 MG tablet Take 1,000 mg by mouth at bedtime as needed (knee pain).     Marland Kitchen amLODipine (NORVASC) 5 MG tablet Take 5 mg by mouth daily.    Marland Kitchen aspirin EC 81 MG tablet Take 81 mg by mouth daily.    . benazepril (LOTENSIN) 40 MG tablet Take 40 mg by mouth daily.    . Cholecalciferol (VITAMIN D) 2000 UNITS CAPS Take 2,000 Units by mouth daily.    . ciprofloxacin (CILOXAN) 0.3 % ophthalmic solution Place 1 drop into the left eye 2 (two) times daily.    Marland Kitchen enoxaparin (LOVENOX) 100 MG/ML  injection Inject 100 mg into the skin.    Marland Kitchen estradiol (ESTRACE) 0.5 MG tablet Take 0.5 mg by mouth daily.    . furosemide (LASIX) 40 MG tablet     . gabapentin (NEURONTIN) 300 MG capsule Take 600 mg by mouth at bedtime.    . gabapentin (NEURONTIN) 800 MG tablet     . glipiZIDE (GLUCOTROL XL) 5 MG 24 hr tablet Take 5 mg by mouth daily.    . hydrochlorothiazide (MICROZIDE) 12.5 MG capsule Take 12.5 mg by mouth at bedtime.     Marland Kitchen ketorolac (ACULAR) 0.4 % SOLN     . levothyroxine (SYNTHROID, LEVOTHROID) 100 MCG tablet Take 100 mcg by mouth daily before breakfast.    . lovastatin (MEVACOR) 40 MG tablet Take 40 mg by mouth 2 (two) times daily.    . metFORMIN (GLUCOPHAGE) 500 MG tablet Take 500 mg by mouth daily with breakfast.    . metoprolol (LOPRESSOR) 50 MG tablet Take 50 mg by mouth.    . metoprolol succinate (TOPROL-XL) 50 MG 24 hr tablet Take 50 mg by mouth daily. Take with or immediately following a meal.    . Nepafenac (ILEVRO) 0.3 % SUSP Place 1 drop into the left eye once.    Marland Kitchen omeprazole (PRILOSEC) 20 MG capsule Take 20 mg by mouth daily.    . potassium chloride (K-DUR,KLOR-CON) 10 MEQ tablet Take 10 mEq by mouth 2 (two) times daily.    Marland Kitchen  potassium chloride (MICRO-K) 10 MEQ CR capsule     . tiZANidine (ZANAFLEX) 4 MG tablet Take 2 mg by mouth at bedtime.    Marland Kitchen warfarin (COUMADIN) 3 MG tablet Take 3-6 mg by mouth daily at 6 PM. Take 2 tablets (6 mg) by mouth on Monday, Wednsday, Friday, Saturday and Sunday, take 1 tablet (3 mg) on Tuesday and Thursday     No current facility-administered medications on file prior to visit.    No Known Allergies  No results found for this or any previous visit (from the past 2160 hour(s)).  Objective: General: Patient is awake, alert, and oriented x 3 and in no acute distress.  Integument: Skin is warm, dry and supple bilateral. Nails are minimally tender, long, thickened and slightly incurvated at right hallux and left 2nd toe medial margins, all other  nails free of ingrowing and thickness. No signs of infection. Callus present at the right lateral aspect of the fifth metatarsal with surrounding soft tissue swelling, suggestive of capsulitis secondary to irritation and foot type. Remaining integument unremarkable.  Vasculature:  Dorsalis Pedis pulse 1/4 bilateral. Posterior Tibial pulse  1/4 bilateral. Capillary fill time <3 sec 1-5 bilateral. No hair growth to the level of the digits.Temperature gradient within normal limits. + varicosities present bilateral. Trace edema present bilateral.   Neurology: The patient has intact sensation measured with a 5.07/10g Semmes Weinstein Monofilament at all pedal sites bilateral . Vibratory sensation diminished bilateral with tuning fork. No Babinski sign present bilateral.   Musculoskeletal: Asymptomatic hammertoe noted bilateral. Muscular strength 5/5 in all lower extremity muscular groups bilateral without pain on range of motion . No tenderness with calf compression bilateral.  X-rays right foot. Decreased osseous mineralization. There is significant diffuse arthritis present, there is significant posterior and inferior calcaneal spurring and hammertoe deformity present. There is mild soft tissue swelling and large habitus noted. No other acute findings.  Assessment and Plan: Problem List Items Addressed This Visit    None    Visit Diagnoses    Right foot pain    -  Primary   Relevant Medications   triamcinolone acetonide (KENALOG) 10 MG/ML injection 10 mg (Start on 01/21/2016 12:00 PM)   Other Relevant Orders   DG Foot 2 Views Right   Capsulitis       Relevant Medications   triamcinolone acetonide (KENALOG) 10 MG/ML injection 10 mg (Start on 01/21/2016 12:00 PM)   Callus of foot       Relevant Medications   triamcinolone acetonide (KENALOG) 10 MG/ML injection 10 mg (Start on 01/21/2016 12:00 PM)   Arthritis of foot, right       Relevant Medications   triamcinolone acetonide (KENALOG) 10 MG/ML  injection 10 mg (Start on 01/21/2016 12:00 PM)   Dermatophytosis of nail       Diabetes mellitus without complication (HCC)       PVD (peripheral vascular disease) ([place])       Anticoagulant long-term use         -Examined patient.  -X-rays reviewed -Discussed and educated patient on diabetic foot care, especially with  regards to the vascular, neurological and musculoskeletal systems.  -Stressed the importance of good glycemic control and the detriment of not controlling glucose levels in relation to the foot. -Discussed treatment options for ingrowing nails without infection -Patient declined nail procedure at this time -Mechanically debrided all offending borders at right hallux and left 2nd toe bilateral using sterile nail nipper and filed with dremel without incident  -  Recommend nail softener and epsom salt soaks as needed -Discussed treatment options for likely capsulitis with callus secondary to irritation and rubbing from shoes with arthritis at right fifth metatarsophalangeal joint -After oral consent and aseptic prep, injected a mixture containing 1 ml of 2% plain lidocaine, 1 ml 0.5% plain marcaine, 0.5 ml of kenalog 10 and 0.5 ml of dexamethasone phosphate into right fifth metatarsophalangeal joint without complication. Post-injection care discussed with patient. Following mechanically debrided callus using sterile chisel blade without incident, and applied antibiotic cream and Band-Aid. -Answered all patient questions -Patient to return in 4 months for diabetic foot check -Patient advised to call the office if any problems or questions arise in the  Meantime.  Landis Martins, DPM

## 2016-02-09 DIAGNOSIS — Z7901 Long term (current) use of anticoagulants: Secondary | ICD-10-CM | POA: Diagnosis not present

## 2016-02-24 DIAGNOSIS — Z7901 Long term (current) use of anticoagulants: Secondary | ICD-10-CM | POA: Diagnosis not present

## 2016-02-25 DIAGNOSIS — Z950 Presence of cardiac pacemaker: Secondary | ICD-10-CM | POA: Diagnosis not present

## 2016-03-13 DIAGNOSIS — H353131 Nonexudative age-related macular degeneration, bilateral, early dry stage: Secondary | ICD-10-CM | POA: Diagnosis not present

## 2016-03-13 DIAGNOSIS — H16223 Keratoconjunctivitis sicca, not specified as Sjogren's, bilateral: Secondary | ICD-10-CM | POA: Diagnosis not present

## 2016-03-17 DIAGNOSIS — Z7901 Long term (current) use of anticoagulants: Secondary | ICD-10-CM | POA: Diagnosis not present

## 2016-04-06 DIAGNOSIS — Z7901 Long term (current) use of anticoagulants: Secondary | ICD-10-CM | POA: Diagnosis not present

## 2016-05-08 DIAGNOSIS — Z7901 Long term (current) use of anticoagulants: Secondary | ICD-10-CM | POA: Diagnosis not present

## 2016-05-24 ENCOUNTER — Ambulatory Visit (INDEPENDENT_AMBULATORY_CARE_PROVIDER_SITE_OTHER): Payer: PPO | Admitting: Sports Medicine

## 2016-05-24 DIAGNOSIS — E119 Type 2 diabetes mellitus without complications: Secondary | ICD-10-CM

## 2016-05-24 DIAGNOSIS — B351 Tinea unguium: Secondary | ICD-10-CM

## 2016-05-24 DIAGNOSIS — I739 Peripheral vascular disease, unspecified: Secondary | ICD-10-CM | POA: Diagnosis not present

## 2016-05-24 DIAGNOSIS — L84 Corns and callosities: Secondary | ICD-10-CM

## 2016-05-24 NOTE — Progress Notes (Signed)
Patient ID: [name], female   DOB: [birth year], 76 y.o.   MRN: [id number] Subjective: [name] is a 76 y.o. female patient with history of diabetes who presents to office today complaining of long, painful nails  while ambulating in shoes; unable to trim. Patient states that she goes for pedicures but sometimes the left 2nd toe gets tender at the corners. Reports a new spot after wearing a shoes on right foot that is sore; states she's not sure what shoes have caused it. Patient states the glucose reading this morning was good. Patient denies any new changes in medication or new problems. Patient denies any new cramping, numbness, burning or tingling in the legs.  Patient Active Problem List   Diagnosis Date Noted  . Chronic atrial fibrillation ([place]) 10/21/2014  . Dyslipidemia 10/21/2014  . Essential (primary) hypertension 10/21/2014  . Type 2 diabetes mellitus ([place]) 10/21/2014  . Atrial fibrillation ([place]) 08/13/2014  . Artificial cardiac pacemaker 08/13/2014  . Arthritis of knee, degenerative 11/20/2013  . Gonalgia 11/20/2013  . Macular hole, left eye 10/23/2012   Current Outpatient Prescriptions on File Prior to Visit  Medication Sig Dispense Refill  . acetaminophen (TYLENOL) 500 MG tablet Take 1,000 mg by mouth at bedtime as needed (knee pain).     Marland Kitchen amLODipine (NORVASC) 5 MG tablet Take 5 mg by mouth daily.    Marland Kitchen aspirin EC 81 MG tablet Take 81 mg by mouth daily.    . benazepril (LOTENSIN) 40 MG tablet Take 40 mg by mouth daily.    . Cholecalciferol (VITAMIN D) 2000 UNITS CAPS Take 2,000 Units by mouth daily.    . ciprofloxacin (CILOXAN) 0.3 % ophthalmic solution Place 1 drop into the left eye 2 (two) times daily.    Marland Kitchen enoxaparin (LOVENOX) 100 MG/ML injection Inject 100 mg into the skin.    Marland Kitchen estradiol (ESTRACE) 0.5 MG tablet Take 0.5 mg by mouth daily.    . furosemide (LASIX) 40 MG tablet     . gabapentin (NEURONTIN) 300 MG capsule Take 600 mg by mouth at bedtime.    .  gabapentin (NEURONTIN) 800 MG tablet     . glipiZIDE (GLUCOTROL XL) 5 MG 24 hr tablet Take 5 mg by mouth daily.    . hydrochlorothiazide (MICROZIDE) 12.5 MG capsule Take 12.5 mg by mouth at bedtime.     Marland Kitchen ketorolac (ACULAR) 0.4 % SOLN     . levothyroxine (SYNTHROID, LEVOTHROID) 100 MCG tablet Take 100 mcg by mouth daily before breakfast.    . lovastatin (MEVACOR) 40 MG tablet Take 40 mg by mouth 2 (two) times daily.    . metFORMIN (GLUCOPHAGE) 500 MG tablet Take 500 mg by mouth daily with breakfast.    . metoprolol (LOPRESSOR) 50 MG tablet Take 50 mg by mouth.    . metoprolol succinate (TOPROL-XL) 50 MG 24 hr tablet Take 50 mg by mouth daily. Take with or immediately following a meal.    . Nepafenac (ILEVRO) 0.3 % SUSP Place 1 drop into the left eye once.    Marland Kitchen omeprazole (PRILOSEC) 20 MG capsule Take 20 mg by mouth daily.    . potassium chloride (K-DUR,KLOR-CON) 10 MEQ tablet Take 10 mEq by mouth 2 (two) times daily.    . potassium chloride (MICRO-K) 10 MEQ CR capsule     . tiZANidine (ZANAFLEX) 4 MG tablet Take 2 mg by mouth at bedtime.    Marland Kitchen warfarin (COUMADIN) 3 MG tablet Take 3-6 mg by mouth daily at  6 PM. Take 2 tablets (6 mg) by mouth on Monday, Wednsday, Friday, Saturday and Sunday, take 1 tablet (3 mg) on Tuesday and Thursday     Current Facility-Administered Medications on File Prior to Visit  Medication Dose Route Frequency Provider Last Rate Last Dose  . triamcinolone acetonide (KENALOG) 10 MG/ML injection 10 mg  10 mg Other Once Landis Martins, DPM       No Known Allergies  No results found for this or any previous visit (from the past 2160 hour(s)).  Objective: General: Patient is awake, alert, and oriented x 3 and in no acute distress.  Integument: Skin is warm, dry and supple bilateral. Nails are minimally tender, long, thickened and slightly incurvated at  left 2nd toe medial margin, all other nails free of ingrowing and thickness. No signs of infection. No open lesions, +  right 5th met head preulcerative lesion with blanchable erythema with no signs of infection. Remaining integument unremarkable.  Vasculature:  Dorsalis Pedis pulse 1/4 bilateral. Posterior Tibial pulse  1/4 bilateral. Capillary fill time <3 sec 1-5 bilateral. No hair growth to the level of the digits. Temperature gradient within normal limits. + varicosities present bilateral. Trace edema present bilateral.   Neurology: The patient has intact sensation measured with a 5.07/10g Semmes Weinstein Monofilament at all pedal sites bilateral. Vibratory sensation diminished bilateral with tuning fork. No Babinski sign present bilateral.   Musculoskeletal: No symptomatic gross pedal deformities noted bilateral. +prominent met head right 5th met head. Muscular strength 5/5 in all lower extremity muscular groups bilateral without pain on range of motion . No tenderness with calf compression bilateral.  Assessment and Plan: Problem List Items Addressed This Visit    None    Visit Diagnoses    Pre-ulcerative calluses    -  Primary   R sub met 5   Dermatophytosis of nail       Diabetes mellitus without complication (HCC)       PVD (peripheral vascular disease) ([place])         -Examined patient. -Discussed and educated patient on diabetic foot care, especially with  regards to the vascular, neurological and musculoskeletal systems.  -Stressed the importance of good glycemic control and the detriment of not  controlling glucose levels in relation to the foot. -Discussed treatment options for ingrowing nails without infection -Patient declined nail procedure at this time -Mechanically debrided all offending borders at left 2nd toe using sterile nail nipper and filed with dremel without incident  -Recommend nail softener and epsom salt soaks as needed -Recommend u shape offloading padding to right foot and post op shoe x1 week then use normal shoes that are wider to prevent rubbing or irritation -Answered  all patient questions -Patient to return in 3 months for at risk foot care -Patient advised to call the office if any problems or questions arise in the meantime.  Landis Martins, DPM

## 2016-05-26 DIAGNOSIS — Z950 Presence of cardiac pacemaker: Secondary | ICD-10-CM | POA: Diagnosis not present

## 2016-06-01 ENCOUNTER — Encounter: Payer: Self-pay | Admitting: Sports Medicine

## 2016-06-01 ENCOUNTER — Ambulatory Visit (INDEPENDENT_AMBULATORY_CARE_PROVIDER_SITE_OTHER): Payer: PPO | Admitting: Sports Medicine

## 2016-06-01 DIAGNOSIS — M779 Enthesopathy, unspecified: Secondary | ICD-10-CM | POA: Diagnosis not present

## 2016-06-01 DIAGNOSIS — L84 Corns and callosities: Secondary | ICD-10-CM | POA: Diagnosis not present

## 2016-06-01 DIAGNOSIS — E119 Type 2 diabetes mellitus without complications: Secondary | ICD-10-CM

## 2016-06-01 DIAGNOSIS — M2141 Flat foot [pes planus] (acquired), right foot: Secondary | ICD-10-CM

## 2016-06-01 DIAGNOSIS — I739 Peripheral vascular disease, unspecified: Secondary | ICD-10-CM

## 2016-06-01 DIAGNOSIS — M2142 Flat foot [pes planus] (acquired), left foot: Secondary | ICD-10-CM

## 2016-06-01 NOTE — Progress Notes (Signed)
Patient ID: [name], female   DOB: 1940-02-18, 76 y.o.   MRN: [id number] Subjective: [name] is a 76 y.o. female patient with history of diabetes who returns to office today complaining of continued pain right foot that is sore at the base of the fifth toe with increasing neuropathy pain, worse at night. Patient states the glucose reading this morning was good. Patient denies any new changes in medication or new problems. Patient denies any new cramping, numbness, burning or tingling in the legs.  Patient Active Problem List   Diagnosis Date Noted  . Chronic atrial fibrillation ([place]) 10/21/2014  . Dyslipidemia 10/21/2014  . Essential (primary) hypertension 10/21/2014  . Type 2 diabetes mellitus ([place]) 10/21/2014  . Atrial fibrillation ([place]) 08/13/2014  . Artificial cardiac pacemaker 08/13/2014  . Arthritis of knee, degenerative 11/20/2013  . Gonalgia 11/20/2013  . Macular hole, left eye 10/23/2012   Current Outpatient Prescriptions on File Prior to Visit  Medication Sig Dispense Refill  . acetaminophen (TYLENOL) 500 MG tablet Take 1,000 mg by mouth at bedtime as needed (knee pain).     Marland Kitchen amLODipine (NORVASC) 5 MG tablet Take 5 mg by mouth daily.    Marland Kitchen aspirin EC 81 MG tablet Take 81 mg by mouth daily.    . benazepril (LOTENSIN) 40 MG tablet Take 40 mg by mouth daily.    . Cholecalciferol (VITAMIN D) 2000 UNITS CAPS Take 2,000 Units by mouth daily.    . ciprofloxacin (CILOXAN) 0.3 % ophthalmic solution Place 1 drop into the left eye 2 (two) times daily.    Marland Kitchen enoxaparin (LOVENOX) 100 MG/ML injection Inject 100 mg into the skin.    Marland Kitchen estradiol (ESTRACE) 0.5 MG tablet Take 0.5 mg by mouth daily.    . furosemide (LASIX) 40 MG tablet     . gabapentin (NEURONTIN) 300 MG capsule Take 600 mg by mouth at bedtime.    . gabapentin (NEURONTIN) 800 MG tablet     . glipiZIDE (GLUCOTROL XL) 5 MG 24 hr tablet Take 5 mg by mouth daily.    . hydrochlorothiazide (MICROZIDE) 12.5 MG capsule  Take 12.5 mg by mouth at bedtime.     Marland Kitchen ketorolac (ACULAR) 0.4 % SOLN     . levothyroxine (SYNTHROID, LEVOTHROID) 100 MCG tablet Take 100 mcg by mouth daily before breakfast.    . lovastatin (MEVACOR) 40 MG tablet Take 40 mg by mouth 2 (two) times daily.    . metFORMIN (GLUCOPHAGE) 500 MG tablet Take 500 mg by mouth daily with breakfast.    . metoprolol (LOPRESSOR) 50 MG tablet Take 50 mg by mouth.    . metoprolol succinate (TOPROL-XL) 50 MG 24 hr tablet Take 50 mg by mouth daily. Take with or immediately following a meal.    . Nepafenac (ILEVRO) 0.3 % SUSP Place 1 drop into the left eye once.    Marland Kitchen omeprazole (PRILOSEC) 20 MG capsule Take 20 mg by mouth daily.    . potassium chloride (K-DUR,KLOR-CON) 10 MEQ tablet Take 10 mEq by mouth 2 (two) times daily.    . potassium chloride (MICRO-K) 10 MEQ CR capsule     . tiZANidine (ZANAFLEX) 4 MG tablet Take 2 mg by mouth at bedtime.    Marland Kitchen warfarin (COUMADIN) 3 MG tablet Take 3-6 mg by mouth daily at 6 PM. Take 2 tablets (6 mg) by mouth on Monday, Wednsday, Friday, Saturday and Sunday, take 1 tablet (3 mg) on Tuesday and Thursday     Current Facility-Administered  Medications on File Prior to Visit  Medication Dose Route Frequency Provider Last Rate Last Dose  . triamcinolone acetonide (KENALOG) 10 MG/ML injection 10 mg  10 mg Other Once Landis Martins, DPM       No Known Allergies  No results found for this or any previous visit (from the past 2160 hour(s)).  Objective: General: Patient is awake, alert, and oriented x 3 and in no acute distress.  Integument: Skin is warm, dry and supple bilateral. Nails are Well manicured. No signs of infection. No open lesions, + right 5th met head preulcerative lesion with blanchable erythema with no signs of infection. Remaining integument unremarkable.  Vasculature:  Dorsalis Pedis pulse 1/4 bilateral. Posterior Tibial pulse  1/4 bilateral. Capillary fill time <3 sec 1-5 bilateral. No hair growth to the level  of the digits.Temperature gradient within normal limits. + varicosities present bilateral. Trace edema present bilateral.   Neurology: The patient has intact sensation measured with a 5.07/10g Semmes Weinstein Monofilament at all pedal sites bilateral. Vibratory sensation diminished bilateral with tuning fork. No Babinski sign present bilateral.   Musculoskeletal: Pes planus type. +prominent met head right 5th met head with mild tenderness. Muscular strength 5/5 in all lower extremity muscular groups bilateral without pain on range of motion except at area of concern . No tenderness with calf compression bilateral.  Assessment and Plan: Problem List Items Addressed This Visit    None    Visit Diagnoses    Capsulitis    -  Primary   Pre-ulcerative calluses       Diabetes mellitus without complication (HCC)       PVD (peripheral vascular disease) ([place])       Pes planus of both feet         -Examined patient. -Discussed and educated patient on diabetic foot care, especially with  regards to the vascular, neurological and musculoskeletal systems.  -Stressed the importance of good glycemic control and the detriment of not  controlling glucose levels in relation to the foot. -Safe step diabetic shoe order form was completed; office to contact primary care for approval / certification;  Office to arrange shoe fitting and dispensing. -After oral consent and aseptic prep, injected a mixture containing 1 ml of 2%  plain lidocaine, 1 ml 0.5% plain marcaine, 0.5 ml of kenalog 10 and 0.5 ml of dexamethasone phosphate into right, fifth metatarsophalangeal joint without complication. Post-injection care discussed with patient.  -Recommend continue with u shape offloading padding to right foot and post op shoe x1 week then use normal shoes that are wider to prevent rubbing or irritation -Answered all patient questions -Patient to return as needed -Patient advised to call the office if any problems or  questions arise in the meantime.  Landis Martins, DPM

## 2016-06-08 DIAGNOSIS — Z7901 Long term (current) use of anticoagulants: Secondary | ICD-10-CM | POA: Diagnosis not present

## 2016-06-26 DIAGNOSIS — M2142 Flat foot [pes planus] (acquired), left foot: Secondary | ICD-10-CM | POA: Diagnosis not present

## 2016-06-26 DIAGNOSIS — E785 Hyperlipidemia, unspecified: Secondary | ICD-10-CM | POA: Diagnosis not present

## 2016-06-26 DIAGNOSIS — E039 Hypothyroidism, unspecified: Secondary | ICD-10-CM | POA: Diagnosis not present

## 2016-06-26 DIAGNOSIS — E119 Type 2 diabetes mellitus without complications: Secondary | ICD-10-CM | POA: Diagnosis not present

## 2016-06-26 DIAGNOSIS — I10 Essential (primary) hypertension: Secondary | ICD-10-CM | POA: Diagnosis not present

## 2016-06-26 DIAGNOSIS — L84 Corns and callosities: Secondary | ICD-10-CM | POA: Diagnosis not present

## 2016-06-26 DIAGNOSIS — M2141 Flat foot [pes planus] (acquired), right foot: Secondary | ICD-10-CM | POA: Diagnosis not present

## 2016-07-14 ENCOUNTER — Ambulatory Visit (INDEPENDENT_AMBULATORY_CARE_PROVIDER_SITE_OTHER): Payer: PPO | Admitting: Sports Medicine

## 2016-07-14 DIAGNOSIS — L84 Corns and callosities: Secondary | ICD-10-CM | POA: Diagnosis not present

## 2016-07-14 DIAGNOSIS — I739 Peripheral vascular disease, unspecified: Secondary | ICD-10-CM | POA: Diagnosis not present

## 2016-07-14 DIAGNOSIS — E119 Type 2 diabetes mellitus without complications: Secondary | ICD-10-CM

## 2016-07-14 NOTE — Progress Notes (Signed)
Patient ID: [name], female   DOB: 18-Feb-1940, 76 y.o.   MRN: [id number] Subjective: [name] is a 76 y.o. female patient with history of diabetes who returns to office today complaining of continued pain right foot that is sore at the base of the fifth toe; reports that she went for a pedicure and that the lady scrubbed the area and it felt better. Patient states the glucose reading this morning was good. Patient denies any new changes in medication or new problems.   Patient Active Problem List   Diagnosis Date Noted  . Chronic atrial fibrillation ([place]) 10/21/2014  . Dyslipidemia 10/21/2014  . Essential (primary) hypertension 10/21/2014  . Type 2 diabetes mellitus ([place]) 10/21/2014  . Atrial fibrillation ([place]) 08/13/2014  . Artificial cardiac pacemaker 08/13/2014  . Arthritis of knee, degenerative 11/20/2013  . Gonalgia 11/20/2013  . Macular hole, left eye 10/23/2012   Current Outpatient Prescriptions on File Prior to Visit  Medication Sig Dispense Refill  . acetaminophen (TYLENOL) 500 MG tablet Take 1,000 mg by mouth at bedtime as needed (knee pain).     Marland Kitchen amLODipine (NORVASC) 5 MG tablet Take 5 mg by mouth daily.    Marland Kitchen aspirin EC 81 MG tablet Take 81 mg by mouth daily.    . benazepril (LOTENSIN) 40 MG tablet Take 40 mg by mouth daily.    . Cholecalciferol (VITAMIN D) 2000 UNITS CAPS Take 2,000 Units by mouth daily.    . ciprofloxacin (CILOXAN) 0.3 % ophthalmic solution Place 1 drop into the left eye 2 (two) times daily.    Marland Kitchen enoxaparin (LOVENOX) 100 MG/ML injection Inject 100 mg into the skin.    Marland Kitchen estradiol (ESTRACE) 0.5 MG tablet Take 0.5 mg by mouth daily.    . furosemide (LASIX) 40 MG tablet     . gabapentin (NEURONTIN) 300 MG capsule Take 600 mg by mouth at bedtime.    . gabapentin (NEURONTIN) 800 MG tablet     . glipiZIDE (GLUCOTROL XL) 5 MG 24 hr tablet Take 5 mg by mouth daily.    . hydrochlorothiazide (MICROZIDE) 12.5 MG capsule Take 12.5 mg by mouth at  bedtime.     Marland Kitchen ketorolac (ACULAR) 0.4 % SOLN     . levothyroxine (SYNTHROID, LEVOTHROID) 100 MCG tablet Take 100 mcg by mouth daily before breakfast.    . lovastatin (MEVACOR) 40 MG tablet Take 40 mg by mouth 2 (two) times daily.    . metFORMIN (GLUCOPHAGE) 500 MG tablet Take 500 mg by mouth daily with breakfast.    . metoprolol (LOPRESSOR) 50 MG tablet Take 50 mg by mouth.    . metoprolol succinate (TOPROL-XL) 50 MG 24 hr tablet Take 50 mg by mouth daily. Take with or immediately following a meal.    . Nepafenac (ILEVRO) 0.3 % SUSP Place 1 drop into the left eye once.    Marland Kitchen omeprazole (PRILOSEC) 20 MG capsule Take 20 mg by mouth daily.    . potassium chloride (K-DUR,KLOR-CON) 10 MEQ tablet Take 10 mEq by mouth 2 (two) times daily.    . potassium chloride (MICRO-K) 10 MEQ CR capsule     . tiZANidine (ZANAFLEX) 4 MG tablet Take 2 mg by mouth at bedtime.    Marland Kitchen warfarin (COUMADIN) 3 MG tablet Take 3-6 mg by mouth daily at 6 PM. Take 2 tablets (6 mg) by mouth on Monday, Wednsday, Friday, Saturday and Sunday, take 1 tablet (3 mg) on Tuesday and Thursday     Current Facility-Administered  Medications on File Prior to Visit  Medication Dose Route Frequency Provider Last Rate Last Dose  . triamcinolone acetonide (KENALOG) 10 MG/ML injection 10 mg  10 mg Other Once Stover, Titorya, DPM       No Known Allergies  No results found for this or any previous visit (from the past 2160 hour(s)).  Objective: General: Patient is awake, alert, and oriented x 3 and in no acute distress.  Integument: Skin is warm, dry and supple bilateral. Nails are Well manicured. No signs of infection. No open lesions, + right 5th met head preulcerative lesion/Callus with blanchable erythema with no signs of infection. Remaining integument unremarkable.  Vasculature:  Dorsalis Pedis pulse 1/4 bilateral. Posterior Tibial pulse  1/4 bilateral. Capillary fill time <3 sec 1-5 bilateral. No hair growth to the level of the  digits.Temperature gradient within normal limits. + varicosities present bilateral. Trace edema present bilateral.   Neurology: The patient has intact sensation measured with a 5.07/10g Semmes Weinstein Monofilament at all pedal sites bilateral. Vibratory sensation diminished bilateral with tuning fork. No Babinski sign present bilateral.   Musculoskeletal: Pes planus type. +prominent met head right 5th met head with mild tenderness. Muscular strength 5/5 in all lower extremity muscular groups bilateral without pain on range of motion except at area of concern . No tenderness with calf compression bilateral.  Assessment and Plan: Problem List Items Addressed This Visit    None    Visit Diagnoses    Pre-ulcerative calluses    -  Primary   Diabetes mellitus without complication (HCC)       PVD (peripheral vascular disease) (HCC)         -Examined patient. -Discussed and educated patient on diabetic foot care, especially with  regards to the vascular, neurological and musculoskeletal systems.  -Stressed the importance of good glycemic control and the detriment of not  controlling glucose levels in relation to the foot. -Patient awaiting diabetic shoe fitting -Mechanically debrided using a sterile chisel blade, callus sub-met 5 on right without incident and applied salinocaine and Band-Aid -Recommend continue with u shape offloading padding to right foot and comfortable shoes to prevent rubbing and irritation -Answered all patient questions -Patient to return as needed -Patient advised to call the office if any problems or questions arise in the meantime.  Titorya Stover, DPM  

## 2016-07-17 ENCOUNTER — Encounter: Payer: Self-pay | Admitting: Cardiology

## 2016-07-17 DIAGNOSIS — I110 Hypertensive heart disease with heart failure: Secondary | ICD-10-CM | POA: Insufficient documentation

## 2016-07-17 DIAGNOSIS — Z7901 Long term (current) use of anticoagulants: Secondary | ICD-10-CM | POA: Insufficient documentation

## 2016-07-17 DIAGNOSIS — I5032 Chronic diastolic (congestive) heart failure: Secondary | ICD-10-CM | POA: Insufficient documentation

## 2016-07-18 ENCOUNTER — Ambulatory Visit (INDEPENDENT_AMBULATORY_CARE_PROVIDER_SITE_OTHER): Payer: PPO | Admitting: Cardiology

## 2016-07-18 ENCOUNTER — Encounter: Payer: Self-pay | Admitting: Cardiology

## 2016-07-18 VITALS — BP 130/70 | HR 61 | Ht 62.0 in | Wt 227.4 lb

## 2016-07-18 DIAGNOSIS — I5032 Chronic diastolic (congestive) heart failure: Secondary | ICD-10-CM

## 2016-07-18 DIAGNOSIS — Z7901 Long term (current) use of anticoagulants: Secondary | ICD-10-CM | POA: Diagnosis not present

## 2016-07-18 DIAGNOSIS — I482 Chronic atrial fibrillation, unspecified: Secondary | ICD-10-CM

## 2016-07-18 DIAGNOSIS — Z950 Presence of cardiac pacemaker: Secondary | ICD-10-CM

## 2016-07-18 DIAGNOSIS — I110 Hypertensive heart disease with heart failure: Secondary | ICD-10-CM

## 2016-07-18 NOTE — Progress Notes (Signed)
Cardiology Office Note:    Date:  07/18/2016   ID:  BYRDIE MIYAZAKI, DOB 04/27/1940, MRN [id number]  PCP:  Ernestene Kiel, MD  Cardiologist:  Shirlee More, MD    Referring MD: Ernestene Kiel, MD    ASSESSMENT:    1. Chronic atrial fibrillation ([place])   2. Chronic diastolic heart failure ([place])   3. Hypertensive heart failure ([place])   4. Pacemaker   5. Chronic anticoagulation    PLAN:    In order of problems listed above:  1. Stable chronic A. fib rate control after AV nodal ablation and permanent pacemaker with resolution of symptoms of exercise intolerance palpitation and stable heart failure. She'll continue warfarin. 2. Stable compensated continue current diuretic sodium restriction and daily weights. 3. Stable blood pressure target continue current medications 4. Stable continue following device clinic [place] [facility] 5. Stable continue warfarin managed by her PCP call INR is 2-3.5.  Next appointment: 6 months   Medication Adjustments/Labs and Tests Ordered: Current medicines are reviewed at length with the patient today.  Concerns regarding medicines are outlined above.  No orders of the defined types were placed in this encounter.  No orders of the defined types were placed in this encounter.   Chief Complaint  Patient presents with  . Follow-up    routine check up  . Atrial Fibrillation  . Anticoagulation  . Congestive Heart Failure     History of Present Illness:    SRESHTA CRESSLER is a 76 y.o. female with a hx of CHF, Atrial Fibrillation, HTN, pacemaker .She is pleased with quality of life remains very active in community service and is not having chest pain shortness of breath palpitation syncope or TIA. She's had no bleeding complications of her warfarin. Recent labs in the last week requested from her PCP. Compliance with diet, lifestyle and medications: yes Past Medical History:  Diagnosis Date  . Arthritis    SPURS, DDD, NECK  . Diabetes  mellitus without complication ([place])   . Dysrhythmia    afib ([place], Dr. Bettina Gavia)  . Edema    legs , left leg weeping, is wrapped 10/28/13  . GERD (gastroesophageal reflux disease)   . H/O hiatal hernia   . Hypertension   . Hypothyroidism   . Mucous colitis   . Pacemaker   . Phlebitis    hx of  . Pulmonary hypertension ([place])   . Shortness of breath    due to pulmonary hypeertension  . Stroke [facility])    1998    Past Surgical History:  Procedure Laterality Date  . [place] VITRECTOMY WITH 20 GAUGE MVR PORT FOR MACULAR HOLE Left 11/12/2012   Procedure: 25 GAUGE PARS PLANA VITRECTOMY WITH 20 GAUGE MVR PORT FOR MACULAR HOLE;  Surgeon: Hayden Pedro, MD;  Location: [facility];  Service: Ophthalmology;  Laterality: Left;  . ABDOMINAL HYSTERECTOMY  1977  . CATARACT EXTRACTION W/PHACO Left 10/29/2013   Procedure: LEFT CATARACT EXTRACTION PHACO AND INTRAOCULAR LENS PLACEMENT (IOC);  Surgeon: [name], MD;  Location: [place];  Service: Ophthalmology;  Laterality: Left;  . CHOLECYSTECTOMY  1983  . GAS INSERTION Left 11/12/2012   Procedure: INSERTION OF GAS;  Surgeon: Hayden Pedro, MD;  Location: [facility];  Service: Ophthalmology;  Laterality: Left;  C3F8 (expires 11/2014)  . INSERT / REPLACE / REMOVE PACEMAKER     2008, DR Audryna Wendt/Dr. Minna Merritts  . KNEE ARTHROSCOPY     1990S  RT   . MEMBRANE PEEL  Left 11/12/2012   Procedure: MEMBRANE PEEL;  Surgeon: Hayden Pedro, MD;  Location: [place];  Service: Ophthalmology;  Laterality: Left;  . PARS PLANA VITRECTOMY Left 11/12/2012   with macular hole    Dr Zigmund Daniel  . PHOTOCOAGULATION WITH LASER Left 11/12/2012   Procedure: HEADSCOPE LASER;  Surgeon: Hayden Pedro, MD;  Location: [place];  Service: Ophthalmology;  Laterality: Left;  . SERUM PATCH Left 11/12/2012   Procedure: SERUM PATCH;  Surgeon: Hayden Pedro, MD;  Location: [place];  Service: Ophthalmology;  Laterality: Left;  . TONSILLECTOMY      Current Medications: Current  Meds  Medication Sig  . acetaminophen (TYLENOL) 500 MG tablet Take 1,000 mg by mouth at bedtime as needed (knee pain).   Marland Kitchen amLODipine (NORVASC) 5 MG tablet Take 5 mg by mouth daily.  Marland Kitchen aspirin EC 81 MG tablet Take 81 mg by mouth daily.  . benazepril (LOTENSIN) 40 MG tablet Take 40 mg by mouth daily.  . Cholecalciferol (VITAMIN D) 2000 UNITS CAPS Take 2,000 Units by mouth daily.  Marland Kitchen estradiol (ESTRACE) 0.5 MG tablet Take 0.5 mg by mouth daily.  . furosemide (LASIX) 40 MG tablet   . gabapentin (NEURONTIN) 800 MG tablet   . glipiZIDE (GLUCOTROL XL) 5 MG 24 hr tablet Take 5 mg by mouth daily.  . hydrochlorothiazide (MICROZIDE) 12.5 MG capsule Take 12.5 mg by mouth at bedtime.   Marland Kitchen levothyroxine (SYNTHROID, LEVOTHROID) 100 MCG tablet Take 100 mcg by mouth daily before breakfast.  . lovastatin (MEVACOR) 40 MG tablet Take 40 mg by mouth 2 (two) times daily.  . metFORMIN (GLUCOPHAGE) 500 MG tablet Take 500 mg by mouth daily with breakfast.  . metoprolol (LOPRESSOR) 50 MG tablet Take 50 mg by mouth.  Marland Kitchen omeprazole (PRILOSEC) 20 MG capsule Take 20 mg by mouth daily.  . potassium chloride (MICRO-K) 10 MEQ CR capsule Take 10 mEq by mouth 2 (two) times daily.   Marland Kitchen tiZANidine (ZANAFLEX) 4 MG tablet Take 2 mg by mouth at bedtime.  Marland Kitchen warfarin (COUMADIN) 3 MG tablet Take 3-6 mg by mouth daily at 6 PM. Take 2 tablets (6 mg) by mouth on Monday, Wednsday, Friday, Saturday and Sunday, take 1 tablet (3 mg) on Tuesday and Thursday   Current Facility-Administered Medications for the 07/18/16 encounter (Office Visit) with Richardo Priest, MD  Medication  . triamcinolone acetonide (KENALOG) 10 MG/ML injection 10 mg     Allergies:   Patient has no known allergies.   Social History   Social History  . Marital status: Widowed    Spouse name: N/A  . Number of children: N/A  . Years of education: N/A   Social History Main Topics  . Smoking status: Never Smoker  . Smokeless tobacco: Never Used  . Alcohol use No  .  Drug use: No  . Sexual activity: Not Asked   Other Topics Concern  . None   Social History Narrative  . None     Family History: The patient's family history is not on file. ROS:   Please see the history of present illness.    All other systems reviewed and are negative.  EKGs/Labs/Other Studies Reviewed:    The following studies were reviewed today:  Pacemaker check done in May 2018 stable parameters and function Recent Labs: last week requested from her PCP No results found for requested labs within last 8760 hours.  Recent Lipid Panel No results found for: CHOL, TRIG, HDL, CHOLHDL, VLDL, LDLCALC, LDLDIRECT  Physical Exam:    VS:  BP 130/70   Pulse 61   Ht 5\' 2"  (1.575 m)   Wt 227 lb 6.4 oz (103.1 kg)   SpO2 98%   BMI 41.59 kg/m     Wt Readings from Last 3 Encounters:  07/18/16 227 lb 6.4 oz (103.1 kg)  10/29/13 226 lb (102.5 kg)  11/12/12 236 lb 12.4 oz (107.4 kg)     GEN:  Well nourished, well developed in no acute distress HEENT: Normal NECK: No JVD; No carotid bruits LYMPHATICS: No lymphadenopathy CARDIAC: Irr Irr variable S1, no murmurs, rubs, gallops RESPIRATORY:  Clear to auscultation without rales, wheezing or rhonchi  ABDOMEN: Soft, non-tender, non-distended MUSCULOSKELETAL:  1+ edema; No deformity  SKIN: Warm and dry NEUROLOGIC:  Alert and oriented x 3 PSYCHIATRIC:  Normal affect    Signed, Shirlee More, MD  07/18/2016 9:33 AM    [place]

## 2016-07-18 NOTE — Patient Instructions (Addendum)
Medication Instructions:  Your physician recommends that you continue on your current medications as directed. Please refer to the Current Medication list given to you today.   Labwork: None. We will get your records from Dr. Laqueta Due.  Testing/Procedures: None  Follow-Up: Your physician wants you to follow-up in: 6 months. You will receive a reminder letter in the mail two months in advance. If you don't receive a letter, please call our office to schedule the follow-up appointment.    Any Other Special Instructions Will Be Listed Below (If Applicable).     If you need a refill on your cardiac medications before your next appointment, please call your pharmacy.    Heart Failure Heart failure is a condition in which the heart has trouble pumping blood because it has become weak or stiff. This means that the heart does not pump blood efficiently for the body to work well. For some people with heart failure, fluid may back up into the lungs and there may be swelling (edema) in the lower legs. Heart failure is usually a long-term (chronic) condition. It is important for you to take good care of yourself and follow the treatment plan from your health care provider. What are the causes? This condition is caused by some health problems, including:  High blood pressure (hypertension). Hypertension causes the heart muscle to work harder than normal. High blood pressure eventually causes the heart to become stiff and weak.  Coronary artery disease (CAD). CAD is the buildup of cholesterol and fat (plaques) in the arteries of the heart.  Heart attack (myocardial infarction). Injured tissue, which is caused by the heart attack, does not contract as well and the heart's ability to pump blood is weakened.  Abnormal heart valves. When the heart valves do not open and close properly, the heart muscle must pump harder to keep the blood flowing.  Heart muscle disease (cardiomyopathy or  myocarditis). Heart muscle disease is damage to the heart muscle from a variety of causes, such as drug or alcohol abuse, infections, or unknown causes. These can increase the risk of heart failure.  Lung disease. When the lungs do not work properly, the heart must work harder.  What increases the risk? Risk of heart failure increases as a person ages. This condition is also more likely to develop in people who:  Are overweight.  Are female.  Smoke or chew tobacco.  Abuse alcohol or illegal drugs.  Have taken medicines that can damage the heart, such as chemotherapy drugs.  Have diabetes. ? High blood sugar (glucose) is associated with high fat (lipid) levels in the blood. ? Diabetes can also damage tiny blood vessels that carry nutrients to the heart muscle.  Have abnormal heart rhythms.  Have thyroid problems.  Have low blood counts (anemia).  What are the signs or symptoms? Symptoms of this condition include:  Shortness of breath with activity, such as when climbing stairs.  Persistent cough.  Swelling of the feet, ankles, legs, or abdomen.  Unexplained weight gain.  Difficulty breathing when lying flat (orthopnea).  Waking from sleep because of the need to sit up and get more air.  Rapid heartbeat.  Fatigue and loss of energy.  Feeling light-headed, dizzy, or close to fainting.  Loss of appetite.  Nausea.  Increased urination during the night (nocturia).  Confusion.  How is this diagnosed? This condition is diagnosed based on:  Medical history, symptoms, and a physical exam.  Diagnostic tests, which may include: ? Echocardiogram. ? Electrocardiogram (  ECG). ? Chest X-ray. ? Blood tests. ? Exercise stress test. ? Radionuclide scans. ? Cardiac catheterization and angiogram.  How is this treated? Treatment for this condition is aimed at managing the symptoms of heart failure. Medicines, behavioral changes, or other treatments may be necessary to  treat heart failure. Medicines These may include:  Angiotensin-converting enzyme (ACE) inhibitors. This type of medicine blocks the effects of a blood protein called angiotensin-converting enzyme. ACE inhibitors relax (dilate) the blood vessels and help to lower blood pressure.  Angiotensin receptor blockers (ARBs). This type of medicine blocks the actions of a blood protein called angiotensin. ARBs dilate the blood vessels and help to lower blood pressure.  Water pills (diuretics). Diuretics cause the kidneys to remove salt and water from the blood. The extra fluid is removed through urination, leaving a lower volume of blood that the heart has to pump.  Beta blockers. These improve heart muscle strength and they prevent the heart from beating too quickly.  Digoxin. This increases the force of the heartbeat.  Healthy behavior changes These may include:  Reaching and maintaining a healthy weight.  Stopping smoking or chewing tobacco.  Eating heart-healthy foods.  Limiting or avoiding alcohol.  Stopping use of street drugs (illegal drugs).  Physical activity.  Other treatments These may include:  Surgery to open blocked coronary arteries or repair damaged heart valves.  Placement of a biventricular pacemaker to improve heart muscle function (cardiac resynchronization therapy). This device paces both the right ventricle and left ventricle.  Placement of a device to treat serious abnormal heart rhythms (implantable cardioverter defibrillator, or ICD).  Placement of a device to improve the pumping ability of the heart (left ventricular assist device, or LVAD).  Heart transplant. This can cure heart failure, and it is considered for certain patients who do not improve with other therapies.  Follow these instructions at home: Medicines  Take over-the-counter and prescription medicines only as told by your health care provider. Medicines are important in reducing the workload of  your heart, slowing the progression of heart failure, and improving your symptoms. ? Do not stop taking your medicine unless your health care provider told you to do that. ? Do not skip any dose of medicine. ? Refill your prescriptions before you run out of medicine. You need your medicines every day. Eating and drinking   Eat heart-healthy foods. Talk with a dietitian to make an eating plan that is right for you. ? Choose foods that contain no trans fat and are low in saturated fat and cholesterol. Healthy choices include fresh or frozen fruits and vegetables, fish, lean meats, legumes, fat-free or low-fat dairy products, and whole-grain or high-fiber foods. ? Limit salt (sodium) if directed by your health care provider. Sodium restriction may reduce symptoms of heart failure. Ask a dietitian to recommend heart-healthy seasonings. ? Use healthy cooking methods instead of frying. Healthy methods include roasting, grilling, broiling, baking, poaching, steaming, and stir-frying.  Limit your fluid intake if directed by your health care provider. Fluid restriction may reduce symptoms of heart failure. Lifestyle  Stop smoking or using chewing tobacco. Nicotine and tobacco can damage your heart and your blood vessels. Do not use nicotine gum or patches before talking to your health care provider.  Limit alcohol intake to no more than 1 drink per day for non-pregnant women and 2 drinks per day for men. One drink equals 12 oz of beer, 5 oz of wine, or 1 oz of hard liquor. ? Drinking more  than that is harmful to your heart. Tell your health care provider if you drink alcohol several times a week. ? Talk with your health care provider about whether any level of alcohol use is safe for you. ? If your heart has already been damaged by alcohol or you have severe heart failure, drinking alcohol should be stopped completely.  Stop use of illegal drugs.  Lose weight if directed by your health care provider.  Weight loss may reduce symptoms of heart failure.  Do moderate physical activity if directed by your health care provider. People who are elderly and people with severe heart failure should consult with a health care provider for physical activity recommendations. Monitor important information  Weigh yourself every day. Keeping track of your weight daily helps you to notice excess fluid sooner. ? Weigh yourself every morning after you urinate and before you eat breakfast. ? Wear the same amount of clothing each time you weigh yourself. ? Record your daily weight. Provide your health care provider with your weight record.  Monitor and record your blood pressure as told by your health care provider.  Check your pulse as told by your health care provider. Dealing with extreme temperatures  If the weather is extremely hot: ? Avoid vigorous physical activity. ? Use air conditioning or fans or seek a cooler location. ? Avoid caffeine and alcohol. ? Wear loose-fitting, lightweight, and light-colored clothing.  If the weather is extremely cold: ? Avoid vigorous physical activity. ? Layer your clothes. ? Wear mittens or gloves, a hat, and a scarf when you go outside. ? Avoid alcohol. General instructions  Manage other health conditions such as hypertension, diabetes, thyroid disease, or abnormal heart rhythms as told by your health care provider.  Learn to manage stress. If you need help to do this, ask your health care provider.  Plan rest periods when fatigued.  Get ongoing education and support as needed.  Participate in or seek rehabilitation as needed to maintain or improve independence and quality of life.  Stay up to date with immunizations. Keeping current on pneumococcal and influenza immunizations is especially important to prevent respiratory infections.  Keep all follow-up visits as told by your health care provider. This is important. Contact a health care provider  if:  You have a rapid weight gain.  You have increasing shortness of breath that is unusual for you.  You are unable to participate in your usual physical activities.  You tire easily.  You cough more than normal, especially with physical activity.  You have any swelling or more swelling in areas such as your hands, feet, ankles, or abdomen.  You are unable to sleep because it is hard to breathe.  You feel like your heart is beating quickly (palpitations).  You become dizzy or light-headed when you stand up. Get help right away if:  You have difficulty breathing.  You notice or your family notices a change in your awareness, such as having trouble staying awake or having difficulty with concentration.  You have pain or discomfort in your chest.  You have an episode of fainting (syncope). This information is not intended to replace advice given to you by your health care provider. Make sure you discuss any questions you have with your health care provider. Document Released: 01/02/2005 Document Revised: 09/07/2015 Document Reviewed: 07/28/2015 Elsevier Interactive Patient Education  2017 [employer].

## 2016-07-21 ENCOUNTER — Other Ambulatory Visit: Payer: PPO | Admitting: *Deleted

## 2016-07-31 DIAGNOSIS — S239XXA Sprain of unspecified parts of thorax, initial encounter: Secondary | ICD-10-CM | POA: Diagnosis not present

## 2016-08-01 DIAGNOSIS — M62830 Muscle spasm of back: Secondary | ICD-10-CM | POA: Diagnosis not present

## 2016-08-01 DIAGNOSIS — Z7901 Long term (current) use of anticoagulants: Secondary | ICD-10-CM | POA: Diagnosis not present

## 2016-08-01 DIAGNOSIS — M546 Pain in thoracic spine: Secondary | ICD-10-CM | POA: Diagnosis not present

## 2016-08-01 DIAGNOSIS — E119 Type 2 diabetes mellitus without complications: Secondary | ICD-10-CM | POA: Diagnosis not present

## 2016-08-09 DIAGNOSIS — Z7901 Long term (current) use of anticoagulants: Secondary | ICD-10-CM | POA: Diagnosis not present

## 2016-08-16 ENCOUNTER — Ambulatory Visit: Payer: PPO | Admitting: Sports Medicine

## 2016-08-16 DIAGNOSIS — E119 Type 2 diabetes mellitus without complications: Secondary | ICD-10-CM

## 2016-08-16 DIAGNOSIS — I739 Peripheral vascular disease, unspecified: Secondary | ICD-10-CM

## 2016-08-16 DIAGNOSIS — M779 Enthesopathy, unspecified: Secondary | ICD-10-CM | POA: Diagnosis not present

## 2016-08-16 DIAGNOSIS — M216X1 Other acquired deformities of right foot: Secondary | ICD-10-CM | POA: Diagnosis not present

## 2016-08-16 DIAGNOSIS — L84 Corns and callosities: Secondary | ICD-10-CM | POA: Diagnosis not present

## 2016-08-16 DIAGNOSIS — Z119 Encounter for screening for infectious and parasitic diseases, unspecified: Secondary | ICD-10-CM | POA: Diagnosis not present

## 2016-08-16 MED ORDER — LIDOCAINE 5 % EX OINT: 1.0000 "application " | TOPICAL_OINTMENT | CUTANEOUS | 0 refills | Status: DC | PRN

## 2016-08-16 NOTE — Progress Notes (Signed)
Patient ID: [name], female   DOB: [birth year], 76 y.o.   MRN: [id number] Subjective: [name] is a 76 y.o. female patient with history of diabetes who returns to office today complaining of continued pain right foot that is sore at the base of the fifth toe; reports that she is worried about having a diabetic ulcer. States that it is sore and hasn't rubbed a area with the skin is rubbing off. Patient states the glucose reading this morning was good. Patient denies any new changes in medication or new problems.   Patient Active Problem List   Diagnosis Date Noted  . Chronic anticoagulation 07/17/2016  . Chronic diastolic heart failure ([place]) 07/17/2016  . Hypertensive heart failure ([place]) 07/17/2016  . Chronic atrial fibrillation ([place]) 10/21/2014  . Dyslipidemia 10/21/2014  . Essential (primary) hypertension 10/21/2014  . Type 2 diabetes mellitus ([place]) 10/21/2014  . Atrial fibrillation ([place]) 08/13/2014  . Pacemaker 08/13/2014  . Arthritis of knee, degenerative 11/20/2013  . Gonalgia 11/20/2013  . Macular hole, left eye 10/23/2012   Current Outpatient Prescriptions on File Prior to Visit  Medication Sig Dispense Refill  . acetaminophen (TYLENOL) 500 MG tablet Take 1,000 mg by mouth at bedtime as needed (knee pain).     Marland Kitchen amLODipine (NORVASC) 5 MG tablet Take 5 mg by mouth daily.    Marland Kitchen aspirin EC 81 MG tablet Take 81 mg by mouth daily.    . benazepril (LOTENSIN) 40 MG tablet Take 40 mg by mouth daily.    . Cholecalciferol (VITAMIN D) 2000 UNITS CAPS Take 2,000 Units by mouth daily.    Marland Kitchen estradiol (ESTRACE) 0.5 MG tablet Take 0.5 mg by mouth daily.    . furosemide (LASIX) 40 MG tablet     . gabapentin (NEURONTIN) 800 MG tablet     . glipiZIDE (GLUCOTROL XL) 5 MG 24 hr tablet Take 5 mg by mouth daily.    . hydrochlorothiazide (MICROZIDE) 12.5 MG capsule Take 12.5 mg by mouth at bedtime.     Marland Kitchen levothyroxine (SYNTHROID, LEVOTHROID) 100 MCG tablet Take 100 mcg by mouth daily  before breakfast.    . lovastatin (MEVACOR) 40 MG tablet Take 40 mg by mouth 2 (two) times daily.    . metFORMIN (GLUCOPHAGE) 500 MG tablet Take 500 mg by mouth daily with breakfast.    . metoprolol (LOPRESSOR) 50 MG tablet Take 50 mg by mouth.    Marland Kitchen omeprazole (PRILOSEC) 20 MG capsule Take 20 mg by mouth daily.    . potassium chloride (MICRO-K) 10 MEQ CR capsule Take 10 mEq by mouth 2 (two) times daily.     Marland Kitchen tiZANidine (ZANAFLEX) 4 MG tablet Take 2 mg by mouth at bedtime.    Marland Kitchen warfarin (COUMADIN) 3 MG tablet Take 3-6 mg by mouth daily at 6 PM. Take 2 tablets (6 mg) by mouth on Monday, Wednsday, Friday, Saturday and Sunday, take 1 tablet (3 mg) on Tuesday and Thursday     Current Facility-Administered Medications on File Prior to Visit  Medication Dose Route Frequency Provider Last Rate Last Dose  . triamcinolone acetonide (KENALOG) 10 MG/ML injection 10 mg  10 mg Other Once Landis Martins, DPM       No Known Allergies  No results found for this or any previous visit (from the past 2160 hour(s)).  Objective: General: Patient is awake, alert, and oriented x 3 and in no acute distress.  Integument: Skin is warm, dry and supple bilateral. Nails are Well manicured. No  signs of infection. No open lesions, + right 5th met head preulcerative lesion/Callus with blanchable erythema with no signs of infection. Remaining integument unremarkable.  Vasculature:  Dorsalis Pedis pulse 1/4 bilateral. Posterior Tibial pulse  1/4 bilateral. Capillary fill time <3 sec 1-5 bilateral. No hair growth to the level of the digits.Temperature gradient within normal limits. + varicosities present bilateral. Trace edema present bilateral.   Neurology: The patient has intact sensation measured with a 5.07/10g Semmes Weinstein Monofilament at all pedal sites bilateral. Vibratory sensation diminished bilateral with tuning fork. No Babinski sign present bilateral.   Musculoskeletal: Pes planus type. +prominent met head  right 5th met head with mild tenderness. Muscular strength 5/5 in all lower extremity muscular groups bilateral without pain on range of motion except at area of concern . No tenderness with calf compression bilateral.  Assessment and Plan: Problem List Items Addressed This Visit    None    Visit Diagnoses    Pre-ulcerative calluses    -  Primary   Relevant Medications   lidocaine (XYLOCAINE) 5 % ointment   Prominent metatarsal head of right foot       Capsulitis       Relevant Medications   lidocaine (XYLOCAINE) 5 % ointment   Diabetes mellitus without complication (HCC)       PVD (peripheral vascular disease) ([place])         -Examined patient. -Discussed and educated patient on diabetic foot care, especially with  regards to the vascular, neurological and musculoskeletal systems.  -Stressed the importance of good glycemic control and the detriment of not  controlling glucose levels in relation to the foot. -Mechanically debrided using a sterile chisel blade, callus sub-met 5 on right without incident and applied salinocaine and Band-Aid -Recommend continue with u shape offloading padding to right foot and comfortable shoes to prevent rubbing and irritationAnd to wear diabetic shoes, which should help -Answered all patient questions -Advised patient because barefoot shaped and made an how this bone sticks out likely this will keep coming back. Thus recommended more aggressive approach of removal of the prominent bone. Patient opt for surgical management. Consent obtained for right fifth metatarsal head resection. Pre and Post op course explained. Risks, benefits, alternatives explained. No guarantees given or implied. Surgical booking slip submitted and provided patient with Surgical packet and info for [facility]. History and physical form given. -Dispensed surgical shoe to use post op -Patient to office after surgery -Patient advised to call the office if any problems or  questions arise in the meantime.  Landis Martins, DPM

## 2016-08-16 NOTE — Patient Instructions (Signed)
Pre-Operative Instructions  Congratulations, you have decided to take an important step towards improving your quality of life.  You can be assured that the doctors and staff at [facility] will be with you every step of the way.  Here are some important things you should know:  1. Plan to be at the surgery center/hospital at least 1 (one) hour prior to your scheduled time, unless otherwise directed by the surgical center/hospital staff.  You must have a responsible adult accompany you, remain during the surgery and drive you home.  Make sure you have directions to the surgical center/hospital to ensure you arrive on time. 2. If you are having surgery at [facility] or [facility] hospitals, you will need a copy of your medical history and physical form from your family physician within one month prior to the date of surgery. We will give you a form for your primary physician to complete.  3. We make every effort to accommodate the date you request for surgery.  However, there are times where surgery dates or times have to be moved.  We will contact you as soon as possible if a change in schedule is required.   4. No aspirin/ibuprofen for one week before surgery.  If you are on aspirin, any non-steroidal anti-inflammatory medications (Mobic, Aleve, Ibuprofen) should not be taken seven (7) days prior to your surgery.  You make take Tylenol for pain prior to surgery.  5. Medications - If you are taking daily heart and blood pressure medications, seizure, reflux, allergy, asthma, anxiety, pain or diabetes medications, make sure you notify the surgery center/hospital before the day of surgery so they can tell you which medications you should take or avoid the day of surgery. 6. No food or drink after midnight the night before surgery unless directed otherwise by surgical center/hospital staff. 7. No alcoholic beverages 24-hours prior to surgery.  No smoking 24-hours prior or 24-hours after  surgery. 8. Wear loose pants or shorts. They should be loose enough to fit over bandages, boots, and casts. 9. Don't wear slip-on shoes. Sneakers are preferred. 10. Bring your boot with you to the surgery center/hospital.  Also bring crutches or a walker if your physician has prescribed it for you.  If you do not have this equipment, it will be provided for you after surgery. 11. If you have not been contacted by the surgery center/hospital by the day before your surgery, call to confirm the date and time of your surgery. 12. Leave-time from work may vary depending on the type of surgery you have.  Appropriate arrangements should be made prior to surgery with your employer. 13. Prescriptions will be provided immediately following surgery by your doctor.  Fill these as soon as possible after surgery and take the medication as directed. Pain medications will not be refilled on weekends and must be approved by the doctor. 14. Remove nail polish on the operative foot and avoid getting pedicures prior to surgery. 15. Wash the night before surgery.  The night before surgery wash the foot and leg well with water and the antibacterial soap provided. Be sure to pay special attention to beneath the toenails and in between the toes.  Wash for at least three (3) minutes. Rinse thoroughly with water and dry well with a towel.  Perform this wash unless told not to do so by your physician.  Enclosed: 1 Ice pack (please put in freezer the night before surgery)   1 Hibiclens skin cleaner     Pre-op instructions  If you have any questions regarding the instructions, please do not hesitate to call our office.  [place]: [address], [place], [place] [address] -- [phone]  [place]: [address] [place], [place] [address] -- [phone]  [place]: [address]  [place], [place] [address] -- [phone]  [place]: [address], [address], [place], [place] [address] -- [phone]  Website:  [email or web address] 

## 2016-08-18 ENCOUNTER — Telehealth: Payer: Self-pay

## 2016-08-18 NOTE — Telephone Encounter (Signed)
LVM for patient to return my call regarding scheduling surgery

## 2016-08-21 ENCOUNTER — Telehealth: Payer: Self-pay | Admitting: *Deleted

## 2016-08-21 NOTE — Telephone Encounter (Signed)
Called and spoke with [name] in regards to the prior authorization for the Lidocaine patches.  Patient states that she spoke with her pharmacist and it was not too expensive so bought them without filing a claim with the insurance.  I asked her if she would like me to go ahead and get the prior authorization and she said no she was okay she went ahead and picked it up on Saturday.

## 2016-08-22 ENCOUNTER — Telehealth: Payer: Self-pay | Admitting: *Deleted

## 2016-08-22 NOTE — Telephone Encounter (Signed)
"  This is Onalee Hua from [place] again.  Could you please give me a call."  I'm returning your call.  You want to schedule surgery.  Dr. Leeanne Rio next available date at [facility] is September 13.  If you want it done sooner, she can do it in [place].  "I don't want to do it in [place].  I'm having to have my daughter bring me and I don't want her to have to take off all day and travel all the way to [place].  I don't want to put her out of her way because my husband is dead and I'm having to depend on her.  They'll call me a day before and let me know the time right?"  Yes, they will let you know a day or two before surgery date when to arrive.  "Do I ask them if it's okay to take my medicines.  I take Coumadin, I know I have to ask my doctor whether or not it's okay to stop it.  When should I see my doctor to have that form filled out?"  You can see them anytime after August 13.  The physical has to be done within 30 days prior to surgery date.  "What do I do with the form once they fill it out?"  You can get them to fax it to the surgical center or you can take it to our [place] office.  "I'd rather take it to the [place] office."

## 2016-08-22 NOTE — Telephone Encounter (Signed)
"  I need to schedule an appointment for surgery.  I'll be here until 10:00.  If you can call me I'd really appreciate it.  Thank you.

## 2016-08-23 ENCOUNTER — Telehealth: Payer: Self-pay | Admitting: *Deleted

## 2016-08-23 ENCOUNTER — Ambulatory Visit: Payer: PPO | Admitting: Sports Medicine

## 2016-08-23 NOTE — Telephone Encounter (Signed)
"  I spoke to you yesterday and scheduled surgery for September 13.  I want to ask to be put on a wait list just in case there's any cancellations.  My foot is killing me.  I can't do it in [place] because I have Colitis and I'm afraid I may have an accident on myself while traveling."  I will let you know if we have any cancellations.

## 2016-08-24 ENCOUNTER — Ambulatory Visit: Payer: PPO | Admitting: *Deleted

## 2016-08-24 DIAGNOSIS — L84 Corns and callosities: Secondary | ICD-10-CM

## 2016-08-24 DIAGNOSIS — I739 Peripheral vascular disease, unspecified: Secondary | ICD-10-CM

## 2016-08-24 DIAGNOSIS — M216X1 Other acquired deformities of right foot: Secondary | ICD-10-CM

## 2016-08-24 DIAGNOSIS — E119 Type 2 diabetes mellitus without complications: Secondary | ICD-10-CM

## 2016-08-24 DIAGNOSIS — Z7901 Long term (current) use of anticoagulants: Secondary | ICD-10-CM | POA: Diagnosis not present

## 2016-08-24 NOTE — Patient Instructions (Signed)

## 2016-08-25 NOTE — Progress Notes (Signed)
Patient ID: [name], female   DOB: [birth year], 76 y.o.   MRN: [id number]   Patient presents for diabetic shoe pick up, shoes are tried on and are not a good fit.  Patient states shoes are slipping and rubbing her heel. After much discussion patient chose to reorder a [employer] [address] grey athletic shoe in women's size 9.5 medium.  We will call her as soon as they arrive.

## 2016-08-28 ENCOUNTER — Telehealth: Payer: Self-pay | Admitting: *Deleted

## 2016-08-28 NOTE — Telephone Encounter (Addendum)
"  Harald Quevedo, you had told me that if I had my surgery done in [place] I could have it done sooner.  Is that still the case?"  Yes, it's still available.  "I go to see my doctor on the 16th.  Will that be okay?  I need to find out when to stop my Coumadin.  He normally tells me to stop it 5-7 days prior to my procedure date.  I haven't taken it today.  That paperwork is all he has to fill out right?"  She can do it on August 20 at [facility].  You will not need that form filled out for this facility.  "So all I need to do is ask him about my Coumadin?"  Yes, that is correct.  "Well go ahead and put me down for that date and I will give him a call and see what he says.  What day is that?"  It is a Monday.  "I'll give you a call back."  "I was able to make an appointment with my doctor for tomorrow morning.  I will ask about my Coumadin.  I won't take it tonight just in case.  So, you said I do not need to take that form to my doctor is that right?"  You will not have to take that form to the doctor.  "She said I would have to do it at a hospital because of my heart condition."  Okay, she will have to do your surgery at [facility] will have to have that form filled out,  [facility] does require it.  "Okay, I'll get it filled out and take it to your [place] Office."  I'll get it scheduled.

## 2016-08-29 DIAGNOSIS — Z01818 Encounter for other preprocedural examination: Secondary | ICD-10-CM | POA: Diagnosis not present

## 2016-08-29 DIAGNOSIS — I10 Essential (primary) hypertension: Secondary | ICD-10-CM | POA: Diagnosis not present

## 2016-08-29 DIAGNOSIS — E119 Type 2 diabetes mellitus without complications: Secondary | ICD-10-CM | POA: Diagnosis not present

## 2016-08-29 DIAGNOSIS — I480 Paroxysmal atrial fibrillation: Secondary | ICD-10-CM | POA: Diagnosis not present

## 2016-08-29 DIAGNOSIS — Z7901 Long term (current) use of anticoagulants: Secondary | ICD-10-CM | POA: Diagnosis not present

## 2016-08-30 NOTE — Pre-Procedure Instructions (Addendum)
[name]  08/30/2016      [facility] - [place], [place] - [place] [address] Phone: [phone] Fax: [phone]  [facility] [place], [place] [address] Phone: [phone] Fax: [phone]    Your procedure is scheduled on Monday, August 20th   Report to [facility] Admitting at 10:30 AM             (posted surgery time 12:30p - 1:30p)   Call this number if you have problems the morning of surgery:  [phone], for other questions call [phone] Mon-Fri 8a-4p   Remember:                4-5 days prior to surgery, STOP TAKING any vitamins, herbal supplements, anti-inflammatories.   Do not eat food or drink liquids after midnight Sunday.   Take these medicines the morning of surgery with A SIP OF WATER : Xanax, Amlodipine, Levothyroxine, Metoprolol, Omeprazole.              Coumadin will be stopped as of _________________________   Do not wear jewelry, make-up or nail polish.  Do not wear lotions, powders, or perfumes, or deoderant.  Do not shave 48 hours prior to surgery.                                                                                                                                             Do not bring valuables to the hospital.  [facility] is not responsible for any belongings or valuables.  Contacts, dentures or bridgework may not be worn into surgery.  Leave your suitcase in the car.  After surgery it may be brought to your room.  For patients admitted to the hospital, discharge time will be determined by your treatment team.  Please read over the following fact sheets that you were given. Pain Booklet, MRSA Information and Surgical Site Infection Prevention     [facility]- Preparing For Surgery  Before surgery, you can play an important role. Because skin is not sterile, your skin needs to be as  free of germs as possible. You can reduce the number of germs on your skin by washing with CHG (chlorahexidine gluconate) Soap before surgery.  CHG is an antiseptic cleaner which kills germs and bonds with the skin to continue killing germs even after washing.  Please do not use if you have an allergy to CHG or antibacterial soaps. If your skin becomes reddened/irritated stop using the CHG.  Do not shave (including legs and underarms) for at least 48 hours prior to first CHG shower. It is OK to shave your face.  Please follow these instructions carefully.   1. Shower the NIGHT BEFORE SURGERY and the MORNING OF SURGERY with CHG.  2. If you chose to wash your hair, wash your hair first as usual with your normal shampoo.  3. After you shampoo, rinse your hair and body thoroughly to remove the shampoo.  4. Use CHG as you would any other liquid soap. You can apply CHG directly to the skin and wash gently with a scrungie or a clean washcloth.   5. Apply the CHG Soap to your body ONLY FROM THE NECK DOWN.  Do not use on open wounds or open sores. Avoid contact with your eyes, ears, mouth and genitals (private parts). Wash genitals (private parts) with your normal soap.  6. Wash thoroughly, paying special attention to the area where your surgery will be performed.  7. Thoroughly rinse your body with warm water from the neck down.  8. DO NOT shower/wash with your normal soap after using and rinsing off the CHG Soap.  9. Pat yourself dry with a CLEAN TOWEL.   10. Wear CLEAN PAJAMAS   11. Place CLEAN SHEETS on your bed the night of your first shower and DO NOT SLEEP WITH PETS.    Day of Surgery: Do not apply any deodorants/lotions. Please wear clean clothes to the hospital/surgery center.        How to Manage Your Diabetes Before and After Surgery  Why is it important to control my blood sugar before and after surgery? . Improving blood sugar levels before and after surgery helps  healing and can limit problems. . A way of improving blood sugar control is eating a healthy diet by: o  Eating less sugar and carbohydrates o  Increasing activity/exercise o  Talking with your doctor about reaching your blood sugar goals . High blood sugars (greater than 180 mg/dL) can raise your risk of infections and slow your recovery, so you will need to focus on controlling your diabetes during the weeks before surgery. . Make sure that the doctor who takes care of your diabetes knows about your planned surgery including the date and location.  How do I manage my blood sugar before surgery? . Check your blood sugar at least 4 times a day, starting 2 days before surgery, to make sure that the level is not too high or low. o Check your blood sugar the morning of your surgery when you wake up and every 2 hours until you get to the Short Stay unit.  . If your blood sugar is less than 70 mg/dL, you will need to treat for low blood sugar: o Do not take insulin. o Treat a low blood sugar (less than 70 mg/dL) with  cup of clear juice (cranberry or apple), 4 glucose tablets, OR glucose gel.  o Recheck blood sugar in 15 minutes after treatment (to make sure it is greater than 70 mg/dL). If your blood sugar is not greater than 70 mg/dL on recheck, call [phone] for further instructions. . Report your blood sugar to the short stay nurse when you get to Short Stay.  . If you are admitted to the hospital after surgery: o Your blood sugar will be checked by the staff and you will probably be given insulin after surgery (instead of oral diabetes medicines) to make sure you have good blood sugar levels. o The goal for blood sugar control after surgery is 80-180 mg/dL   . Do not take oral diabetes medicines (pills) the morning of surgery.   Patient Signature:  Date:   Nurse Signature:  Date:   Reviewed and Endorsed by [facility]  Health Patient Education Committee, August 2015

## 2016-08-31 ENCOUNTER — Encounter (HOSPITAL_COMMUNITY): Payer: Self-pay

## 2016-08-31 ENCOUNTER — Encounter (HOSPITAL_COMMUNITY)
Admission: RE | Admit: 2016-08-31 | Discharge: 2016-08-31 | Disposition: A | Discharge status: Home / Self Care | Payer: PPO | Source: Ambulatory Visit | Attending: Sports Medicine | Admitting: Sports Medicine

## 2016-08-31 DIAGNOSIS — R9431 Abnormal electrocardiogram [ECG] [EKG]: Secondary | ICD-10-CM | POA: Insufficient documentation

## 2016-08-31 DIAGNOSIS — Z01818 Encounter for other preprocedural examination: Secondary | ICD-10-CM | POA: Insufficient documentation

## 2016-08-31 HISTORY — DX: Headache: R51

## 2016-08-31 HISTORY — DX: Headache, unspecified: R51.9

## 2016-08-31 LAB — BASIC METABOLIC PANEL
ANION GAP: 11 (ref 5–15)
BUN: 40 mg/dL — AB (ref 6–20)
CO2: 24 mmol/L (ref 22–32)
CREATININE: 1.33 mg/dL — AB (ref 0.44–1.00)
Calcium: 9.2 mg/dL (ref 8.9–10.3)
Chloride: 104 mmol/L (ref 101–111)
GFR calc Af Amer: 44 mL/min — ABNORMAL LOW (ref >60)
GFR calc non Af Amer: 38 mL/min — ABNORMAL LOW (ref >60)
Glucose, Bld: 205 mg/dL — ABNORMAL HIGH (ref 65–99)
Potassium: 4.3 mmol/L (ref 3.5–5.1)
Sodium: 139 mmol/L (ref 135–145)

## 2016-08-31 LAB — CBC
HEMATOCRIT: 39.7 % (ref 36.0–46.0)
Hemoglobin: 13.1 g/dL (ref 12.0–15.0)
MCH: 31.6 pg (ref 26.0–34.0)
MCHC: 33 g/dL (ref 30.0–36.0)
MCV: 95.9 fL (ref 78.0–100.0)
PLATELETS: 208 10*3/uL (ref 150–400)
RBC: 4.14 MIL/uL (ref 3.87–5.11)
RDW: 14.1 % (ref 11.5–15.5)
WBC: 7 10*3/uL (ref 4.0–10.5)

## 2016-08-31 LAB — HEMOGLOBIN A1C
Hgb A1c MFr Bld: 6.5 % — ABNORMAL HIGH (ref 4.8–5.6)
MEAN PLASMA GLUCOSE: 139.85 mg/dL

## 2016-08-31 LAB — SURGICAL PCR SCREEN
MRSA, PCR: NEGATIVE
Staphylococcus aureus: NEGATIVE

## 2016-08-31 NOTE — Progress Notes (Addendum)
Cardio is Dr. Shirlee More  Fisher 07/2016 She has been instructed to stop her coumadin & aspirin on the 12th of August. Does have [employer] pacer--placed by Dr. Erskine Emery. (form faxed to office, tho patient does telephone checkups. States she thinks she had echo done around 2012-13 at [facility] in [place].(I have sent a request)  Ph#  [phone] That's how she found out she had pulmonary hypertension.  No issues currently.  Olivia Canter, [employer] rep called and surgery date and time relayed to her  [phone] Does check her blood sugars 2-3 times a day - they run 60-99.  Since her surgery is later that morning, I did advise her to eat something "good" the night before.  (she has woke up from sleep, tested her sugar and it was in the 30's!

## 2016-09-01 ENCOUNTER — Other Ambulatory Visit (HOSPITAL_COMMUNITY): Payer: PPO

## 2016-09-01 LAB — GLUCOSE, CAPILLARY: Glucose-Capillary: 183 mg/dL — ABNORMAL HIGH (ref 65–99)

## 2016-09-01 MED ORDER — DEXTROSE 5 % IV SOLN
3.0000 g | INTRAVENOUS | Status: AC
  Administered 2016-09-04: 3 g via INTRAVENOUS
  Filled 2016-09-01 (×2): qty 3000

## 2016-09-01 NOTE — Progress Notes (Signed)
Anesthesia chart review  Patient is a 76 year old female scheduled for complete R fifth metatarsal head ostectomy on 09/04/2016 with Landis Martins, DPM  - Cardiologist is Shirlee More, MD  PMH includes: Atrial fibrillation, stroke (1998), pacemaker, HTN, DM, pulmonary hypertension, hypothyroidism, GERD. Never smoker. BMI 41.   Medications include: Amlodipine, ASA 81 mg, benazepril, Lasix, glipizide, HCTZ, levothyroxine, lovastatin, metformin, metoprolol, Prilosec, potassium, Coumadin. Last Coumadin dose 08/27/16.  Preoperative labs reviewed. - HbA1c 6.5, glucose 205. - PT and PTT will be obtained DOS  EKG 08/31/16: Ventricular-paced rhythm  Echo 08/04/13 ([facility]):  1. LV cavity normal in size. Calculated EF 55% 2. LA cavity moderately dilated 3. RA cavity moderately dilated 4. Structurally normal MV with mild regurgitation 5. Structurally normal TV with moderate regurgitation 6. Mild to moderate pulmonary hypertension. 7. Pacemaker lead is seen in the RA and RV  Perioperative prescription form for pacemaker pending.  If labs acceptable DOS, I anticipate pt can proceed with surgery as scheduled.   Willeen Cass, FNP-BC [facility] Short Stay Surgical Center/Anesthesiology Phone: [phone] 09/01/2016 2:07 PM

## 2016-09-04 ENCOUNTER — Ambulatory Visit (HOSPITAL_COMMUNITY): Payer: PPO | Admitting: Emergency Medicine

## 2016-09-04 ENCOUNTER — Encounter (HOSPITAL_COMMUNITY)
Admission: RE | Disposition: A | Discharge status: Home / Self Care | Payer: Self-pay | Source: Ambulatory Visit | Attending: Sports Medicine

## 2016-09-04 ENCOUNTER — Ambulatory Visit (HOSPITAL_COMMUNITY): Payer: PPO | Admitting: Certified Registered"

## 2016-09-04 ENCOUNTER — Ambulatory Visit (HOSPITAL_COMMUNITY)
Admission: RE | Admit: 2016-09-04 | Discharge: 2016-09-04 | Disposition: A | Discharge status: Home / Self Care | Payer: PPO | Source: Ambulatory Visit | Attending: Sports Medicine | Admitting: Sports Medicine

## 2016-09-04 DIAGNOSIS — Z7901 Long term (current) use of anticoagulants: Secondary | ICD-10-CM | POA: Diagnosis not present

## 2016-09-04 DIAGNOSIS — N189 Chronic kidney disease, unspecified: Secondary | ICD-10-CM | POA: Diagnosis not present

## 2016-09-04 DIAGNOSIS — Z79899 Other long term (current) drug therapy: Secondary | ICD-10-CM | POA: Insufficient documentation

## 2016-09-04 DIAGNOSIS — I110 Hypertensive heart disease with heart failure: Secondary | ICD-10-CM | POA: Diagnosis not present

## 2016-09-04 DIAGNOSIS — I5032 Chronic diastolic (congestive) heart failure: Secondary | ICD-10-CM | POA: Insufficient documentation

## 2016-09-04 DIAGNOSIS — M25774 Osteophyte, right foot: Secondary | ICD-10-CM | POA: Insufficient documentation

## 2016-09-04 DIAGNOSIS — E785 Hyperlipidemia, unspecified: Secondary | ICD-10-CM | POA: Insufficient documentation

## 2016-09-04 DIAGNOSIS — I130 Hypertensive heart and chronic kidney disease with heart failure and stage 1 through stage 4 chronic kidney disease, or unspecified chronic kidney disease: Secondary | ICD-10-CM | POA: Diagnosis not present

## 2016-09-04 DIAGNOSIS — Z7984 Long term (current) use of oral hypoglycemic drugs: Secondary | ICD-10-CM | POA: Insufficient documentation

## 2016-09-04 DIAGNOSIS — Z9889 Other specified postprocedural states: Secondary | ICD-10-CM

## 2016-09-04 DIAGNOSIS — Z7982 Long term (current) use of aspirin: Secondary | ICD-10-CM | POA: Diagnosis not present

## 2016-09-04 DIAGNOSIS — E119 Type 2 diabetes mellitus without complications: Secondary | ICD-10-CM | POA: Insufficient documentation

## 2016-09-04 DIAGNOSIS — Z950 Presence of cardiac pacemaker: Secondary | ICD-10-CM | POA: Diagnosis not present

## 2016-09-04 DIAGNOSIS — E669 Obesity, unspecified: Secondary | ICD-10-CM | POA: Diagnosis not present

## 2016-09-04 DIAGNOSIS — I482 Chronic atrial fibrillation: Secondary | ICD-10-CM | POA: Diagnosis not present

## 2016-09-04 DIAGNOSIS — Z6841 Body Mass Index (BMI) 40.0 and over, adult: Secondary | ICD-10-CM | POA: Diagnosis not present

## 2016-09-04 DIAGNOSIS — I2510 Atherosclerotic heart disease of native coronary artery without angina pectoris: Secondary | ICD-10-CM | POA: Diagnosis not present

## 2016-09-04 DIAGNOSIS — H35342 Macular cyst, hole, or pseudohole, left eye: Secondary | ICD-10-CM | POA: Insufficient documentation

## 2016-09-04 DIAGNOSIS — M7741 Metatarsalgia, right foot: Secondary | ICD-10-CM | POA: Diagnosis not present

## 2016-09-04 HISTORY — PX: OSTECTOMY: SHX6439

## 2016-09-04 LAB — GLUCOSE, CAPILLARY
GLUCOSE-CAPILLARY: 85 mg/dL (ref 65–99)
GLUCOSE-CAPILLARY: 102 mg/dL — AB (ref 65–99)
Glucose-Capillary: 88 mg/dL (ref 65–99)

## 2016-09-04 LAB — PROTIME-INR
INR: 1.12
Prothrombin Time: 14.4 seconds (ref 11.4–15.2)

## 2016-09-04 LAB — APTT: aPTT: 29 seconds (ref 24–36)

## 2016-09-04 SURGERY — OSTECTOMY
Anesthesia: General | Site: Foot | Laterality: Right

## 2016-09-04 MED ORDER — MIDAZOLAM HCL 5 MG/5ML IJ SOLN
INTRAMUSCULAR | Status: DC | PRN
  Administered 2016-09-04 (×2): 1 mg via INTRAVENOUS

## 2016-09-04 MED ORDER — BUPIVACAINE HCL (PF) 0.5 % IJ SOLN
INTRAMUSCULAR | Status: DC | PRN
  Administered 2016-09-04: 5 mL

## 2016-09-04 MED ORDER — DOCUSATE SODIUM 100 MG PO CAPS: 100.0000 mg | ORAL_CAPSULE | Freq: Two times a day (BID) | ORAL | 0 refills | Status: DC

## 2016-09-04 MED ORDER — LIDOCAINE HCL 1 % IJ SOLN
INTRAMUSCULAR | Status: DC | PRN
  Administered 2016-09-04: 5 mL

## 2016-09-04 MED ORDER — 0.9 % SODIUM CHLORIDE (POUR BTL) OPTIME
TOPICAL | Status: DC | PRN
  Administered 2016-09-04: 1000 mL

## 2016-09-04 MED ORDER — LIDOCAINE 2% (20 MG/ML) 5 ML SYRINGE
INTRAMUSCULAR | Status: AC
  Filled 2016-09-04: qty 5

## 2016-09-04 MED ORDER — LACTATED RINGERS IV SOLN
INTRAVENOUS | Status: DC | PRN
  Administered 2016-09-04: 12:00:00 via INTRAVENOUS

## 2016-09-04 MED ORDER — ONDANSETRON HCL 4 MG/2ML IJ SOLN
INTRAMUSCULAR | Status: AC
  Filled 2016-09-04: qty 2

## 2016-09-04 MED ORDER — FENTANYL CITRATE (PF) 250 MCG/5ML IJ SOLN
INTRAMUSCULAR | Status: AC
  Filled 2016-09-04: qty 5

## 2016-09-04 MED ORDER — ONDANSETRON HCL 4 MG/2ML IJ SOLN
INTRAMUSCULAR | Status: DC | PRN
  Administered 2016-09-04: 4 mg via INTRAVENOUS

## 2016-09-04 MED ORDER — BUPIVACAINE HCL 0.5 % IJ SOLN
INTRAMUSCULAR | Status: AC
  Filled 2016-09-04: qty 1

## 2016-09-04 MED ORDER — LIDOCAINE HCL (PF) 1 % IJ SOLN
INTRAMUSCULAR | Status: AC
  Filled 2016-09-04: qty 30

## 2016-09-04 MED ORDER — MIDAZOLAM HCL 2 MG/2ML IJ SOLN
INTRAMUSCULAR | Status: AC
  Filled 2016-09-04: qty 2

## 2016-09-04 MED ORDER — CHLORHEXIDINE GLUCONATE CLOTH 2 % EX PADS: 6.0000 | MEDICATED_PAD | Freq: Once | CUTANEOUS | Status: DC

## 2016-09-04 MED ORDER — PROMETHAZINE HCL 12.5 MG PO TABS: 12.5000 mg | ORAL_TABLET | Freq: Four times a day (QID) | ORAL | 0 refills | Status: DC | PRN

## 2016-09-04 MED ORDER — LIDOCAINE 2% (20 MG/ML) 5 ML SYRINGE
INTRAMUSCULAR | Status: DC | PRN
  Administered 2016-09-04: 80 mg via INTRAVENOUS

## 2016-09-04 MED ORDER — FENTANYL CITRATE (PF) 250 MCG/5ML IJ SOLN
INTRAMUSCULAR | Status: DC | PRN
  Administered 2016-09-04: 25 ug via INTRAVENOUS

## 2016-09-04 MED ORDER — PROPOFOL 10 MG/ML IV BOLUS
INTRAVENOUS | Status: AC
  Filled 2016-09-04: qty 20

## 2016-09-04 MED ORDER — DEXAMETHASONE SODIUM PHOSPHATE 10 MG/ML IJ SOLN
INTRAMUSCULAR | Status: AC
  Filled 2016-09-04: qty 1

## 2016-09-04 MED ORDER — DEXAMETHASONE SODIUM PHOSPHATE 10 MG/ML IJ SOLN
INTRAMUSCULAR | Status: DC | PRN
  Administered 2016-09-04: 5 mg via INTRAVENOUS

## 2016-09-04 MED ORDER — ROCURONIUM BROMIDE 10 MG/ML (PF) SYRINGE
PREFILLED_SYRINGE | INTRAVENOUS | Status: AC
  Filled 2016-09-04: qty 5

## 2016-09-04 MED ORDER — PROPOFOL 10 MG/ML IV BOLUS
INTRAVENOUS | Status: DC | PRN
  Administered 2016-09-04: 150 mg via INTRAVENOUS

## 2016-09-04 SURGICAL SUPPLY — 44 items
APL SKNCLS STERI-STRIP NONHPOA (GAUZE/BANDAGES/DRESSINGS) ×1
BANDAGE ACE 4X5 VEL STRL LF (GAUZE/BANDAGES/DRESSINGS) ×3 IMPLANT
BENZOIN TINCTURE PRP APPL 2/3 (GAUZE/BANDAGES/DRESSINGS) ×3 IMPLANT
BLADE OSCILLATING/SAGITTAL (BLADE) ×3
BLADE SW THK.38XMED NAR THN (BLADE) IMPLANT
BNDG CMPR 9X4 STRL LF SNTH (GAUZE/BANDAGES/DRESSINGS) ×1
BNDG ESMARK 4X9 LF (GAUZE/BANDAGES/DRESSINGS) ×3 IMPLANT
BNDG GAUZE ELAST 4 BULKY (GAUZE/BANDAGES/DRESSINGS) ×4 IMPLANT
CANISTER SUCT 3000ML PPV (MISCELLANEOUS) IMPLANT
CLOSURE WOUND 1/2 X4 (GAUZE/BANDAGES/DRESSINGS) ×1
CONT SPEC 4OZ CLIKSEAL STRL BL (MISCELLANEOUS) ×3 IMPLANT
COVER SURGICAL LIGHT HANDLE (MISCELLANEOUS) ×6 IMPLANT
CUFF TOURNIQUET SINGLE 18IN (TOURNIQUET CUFF) ×3 IMPLANT
DECANTER SPIKE VIAL GLASS SM (MISCELLANEOUS) ×4 IMPLANT
DRAPE SURG 17X23 STRL (DRAPES) ×3 IMPLANT
DRSG EMULSION OIL 3X3 NADH (GAUZE/BANDAGES/DRESSINGS) ×2 IMPLANT
DURAPREP 26ML APPLICATOR (WOUND CARE) ×3 IMPLANT
ELECT NDL TIP 2.8 STRL (NEEDLE) IMPLANT
ELECT NEEDLE TIP 2.8 STRL (NEEDLE) IMPLANT
ELECT REM PT RETURN 9FT ADLT (ELECTROSURGICAL)
ELECTRODE REM PT RTRN 9FT ADLT (ELECTROSURGICAL) IMPLANT
GAUZE SPONGE 4X4 12PLY STRL (GAUZE/BANDAGES/DRESSINGS) ×3 IMPLANT
GLOVE BIO SURGEON STRL SZ 6.5 (GLOVE) ×2 IMPLANT
GLOVE BIO SURGEONS STRL SZ 6.5 (GLOVE) ×1
GLOVE BIOGEL PI IND STRL 6.5 (GLOVE) ×1 IMPLANT
GLOVE BIOGEL PI INDICATOR 6.5 (GLOVE) ×2
GOWN STRL REUS W/ TWL LRG LVL3 (GOWN DISPOSABLE) ×2 IMPLANT
GOWN STRL REUS W/TWL LRG LVL3 (GOWN DISPOSABLE) ×6
KIT BASIN OR (CUSTOM PROCEDURE TRAY) ×3 IMPLANT
KIT ROOM TURNOVER OR (KITS) ×3 IMPLANT
NDL HYPO 25GX1X1/2 BEV (NEEDLE) ×1 IMPLANT
NEEDLE HYPO 25GX1X1/2 BEV (NEEDLE) ×3 IMPLANT
NS IRRIG 1000ML POUR BTL (IV SOLUTION) ×3 IMPLANT
PACK ORTHO EXTREMITY (CUSTOM PROCEDURE TRAY) ×3 IMPLANT
PAD ARMBOARD 7.5X6 YLW CONV (MISCELLANEOUS) ×6 IMPLANT
SOL PREP POV-IOD 4OZ 10% (MISCELLANEOUS) ×2 IMPLANT
STRIP CLOSURE SKIN 1/2X4 (GAUZE/BANDAGES/DRESSINGS) ×2 IMPLANT
SUT ETHILON 4 0 PS 2 18 (SUTURE) ×2 IMPLANT
SUT VIC AB 3-0 SH 27 (SUTURE) ×3
SUT VIC AB 3-0 SH 27X BRD (SUTURE) IMPLANT
SYR CONTROL 10ML LL (SYRINGE) ×3 IMPLANT
TOWEL OR 17X24 6PK STRL BLUE (TOWEL DISPOSABLE) ×3 IMPLANT
TOWEL OR 17X26 10 PK STRL BLUE (TOWEL DISPOSABLE) ×3 IMPLANT
YANKAUER SUCT BULB TIP NO VENT (SUCTIONS) IMPLANT

## 2016-09-04 NOTE — Anesthesia Preprocedure Evaluation (Addendum)
Anesthesia Evaluation  Patient identified by MRN, date of birth, ID band Patient awake    Reviewed: Allergy & Precautions, NPO status , Patient's Chart, lab work & pertinent test results  Airway Mallampati: II  TM Distance: >3 FB Neck ROM: Full    Dental no notable dental hx.    Pulmonary    Pulmonary exam normal breath sounds clear to auscultation       Cardiovascular hypertension, Normal cardiovascular exam Rhythm:Regular Rate:Normal     Neuro/Psych    GI/Hepatic   Endo/Other  diabetes  Renal/GU      Musculoskeletal   Abdominal   Peds  Hematology   Anesthesia Other Findings   Reproductive/Obstetrics                            Anesthesia Physical Anesthesia Plan  ASA: III  Anesthesia Plan: General   Post-op Pain Management:    Induction: Intravenous  PONV Risk Score and Plan: Ondansetron  Airway Management Planned: LMA  Additional Equipment:   Intra-op Plan:   Post-operative Plan:   Informed Consent: I have reviewed the patients History and Physical, chart, labs and discussed the procedure including the risks, benefits and alternatives for the proposed anesthesia with the patient or authorized representative who has indicated his/her understanding and acceptance.     Plan Discussed with: CRNA and Anesthesiologist  Anesthesia Plan Comments:         Anesthesia Quick Evaluation

## 2016-09-04 NOTE — Op Note (Signed)
DATE: 09/04/16  SURGEON: Landis Martins, DPM  ASSISTANT: Hardie Pulley, DOM   PREOPERATIVE DIAGNOSIS: Right pre-ulcerative lesion with prominent 5th metatarsal head   POSTOPERATIVE DIAGNOSIS:Same  PROCEDURE PERFORMED:Right 5th metatarsal head resection   HEMOSTASIS: Right ankle tourniquet  ESTIMATED BLOOD LOSS: Minimal  ANESTHESIA: MAC with local 10cc of 1:1 mixture 1% lidocaine and 0.5% marcaine plain  SPECIMENS: Bone right foot   COMPLICATIONS: None.  INDICATIONS FOR PROCEDURE: This patient is a pleasant 76 y.o. Diabetic female who has been seen on many occassions for Right foot callus care/preulcerative lesion care with pain at 5th metatarsal head. Risks and complications include but are not limited to infection, recurrence of symptoms, pain, numbness, wound dehiscence, delayed healing, as well as need for future surgery/amputation. No guarantees were given or applied. All questions were answered to the patient's satisfaction, and the patient has consented to the above procedure. All preoperative labs and H&P, medical clearances have been obtained and NPO status past midnight has been confirmed.  PROCEDURE IN DETAIL:  Under mild sedation, the patient was brought to the operating room and placed on the operating table in a supine position. A well-padded pneumatic ankle tourniquet was placed about the patient's right ankle. MAC anesthesia was given as well as a local block consisting of 10 cc of 1:1 mixture of 0.5% Marcaine plain and 1% lidocaine plain. Foot was then scrubbed, prepped, and draped in the usual aseptic manner. The limb was then elevated. Ankle tourniquet was then inflated. Attention was then directed to the fifth metatarsophalangeal joint, where approximately 3-cm linear longitudinal incision was made. The incision was deepened using sharp and blunt dissection. Care was taken to retract all vital neurovascular structures. The fifth metatarsal head was  then identified. This was freed of all attachments using sharp and blunt dissection. Next, utilizing the oscillating bone saw, the head of the 5th metatarsal was transected and passed from the operative site in toto.The surgical wound were closed in a layered fashion using 3-0 vicryl and 4-0 nylon and dressed with Steri-Strips, Betadine-soaked Adaptic, as well as sterile compressive dressing consisting of 4 x 4's and Kling and ace wrap. The pneumatic ankle tourniquet was released. A prompt hyperemic response was noted to all digits of the right foot following a period of postoperative monitoring. The patient tolerated the procedure well. The patient was transferred from the OR to the recovery room. Vital signs were stable, and neurovascular status was intact to all digits of the right foot. The patient to be discharged home.   Post-operative prescriptions and instructions were written and given to the patient who will return to the office of Dr. Cannon Kettle in 1 week for continued post op care and management of this patient.  Landis Martins, DPM

## 2016-09-04 NOTE — H&P (Signed)
H&P: Podiatry   VHQ:IONGEXB H Treloar 76 y.o. female  patient with a history of pain at right foot with pre ulcerative callus seen on several occassions in office complaining of right foot pain. Patient has tried multiple conservative treatments and opted for Surgical management. Patient had pre-op physical and clearance for removal of right 5th metatarsal head completed. Patient met this AM in pre-op holding area; informed consent reviewed and signed. All questions answered. Right foot/surgical site marked.  This am patient denies any other pedal complaints. Denies Nausea/fever/vomiting/chills/night sweats/overnight events. Confirms NPO since midnight.   Patient Active Problem List   Diagnosis Date Noted  . Chronic anticoagulation 07/17/2016  . Chronic diastolic heart failure ([place]) 07/17/2016  . Hypertensive heart failure ([place]) 07/17/2016  . Chronic atrial fibrillation ([place]) 10/21/2014  . Dyslipidemia 10/21/2014  . Essential (primary) hypertension 10/21/2014  . Type 2 diabetes mellitus ([place]) 10/21/2014  . Atrial fibrillation ([place]) 08/13/2014  . Pacemaker 08/13/2014  . Arthritis of knee, degenerative 11/20/2013  . Gonalgia 11/20/2013  . Macular hole, left eye 10/23/2012    No current facility-administered medications on file prior to encounter.    Current Outpatient Prescriptions on File Prior to Encounter  Medication Sig Dispense Refill  . acetaminophen (TYLENOL) 500 MG tablet Take 1,000 mg by mouth at bedtime as needed (knee pain).     Marland Kitchen amLODipine (NORVASC) 5 MG tablet Take 5 mg by mouth daily.    . benazepril (LOTENSIN) 40 MG tablet Take 40 mg by mouth daily.    . Cholecalciferol (VITAMIN D) 2000 UNITS CAPS Take 2,000 Units by mouth daily.    Marland Kitchen estradiol (ESTRACE) 0.5 MG tablet Take 0.5 mg by mouth daily.    . furosemide (LASIX) 40 MG tablet Take 40 mg by mouth daily. May take additional dose if needed    . gabapentin (NEURONTIN) 800 MG tablet Take 800 mg by mouth at bedtime.      Marland Kitchen glipiZIDE (GLUCOTROL XL) 5 MG 24 hr tablet Take 5 mg by mouth daily with breakfast.     . hydrochlorothiazide (MICROZIDE) 12.5 MG capsule Take 12.5 mg by mouth at bedtime.     Marland Kitchen levothyroxine (SYNTHROID, LEVOTHROID) 100 MCG tablet Take 100 mcg by mouth daily before breakfast.    . lidocaine (XYLOCAINE) 5 % ointment Apply 1 application topically as needed. 35.44 g 0  . lovastatin (MEVACOR) 40 MG tablet Take 40 mg by mouth 2 (two) times daily.    . metFORMIN (GLUCOPHAGE) 500 MG tablet Take 500 mg by mouth daily with breakfast.    . omeprazole (PRILOSEC) 20 MG capsule Take 20 mg by mouth daily.    . potassium chloride (MICRO-K) 10 MEQ CR capsule Take 10 mEq by mouth 2 (two) times daily.     Marland Kitchen tiZANidine (ZANAFLEX) 4 MG tablet Take 2 mg by mouth at bedtime.    Marland Kitchen warfarin (COUMADIN) 3 MG tablet Take 3-6 mg by mouth daily at 6 PM. Take 2 tablets (6 mg) by mouth on Monday, Wednsday, Friday, take 1 tablet (3 mg) on Tuesday and Thursday, &  Saturday and Sunday    . aspirin EC 81 MG tablet Take 81 mg by mouth daily.       No Known Allergies  Social History   Social History  . Marital status: Widowed    Spouse name: N/A  . Number of children: N/A  . Years of education: N/A   Occupational History  . Not on file.   Social History Main Topics  .  Smoking status: Never Smoker  . Smokeless tobacco: Never Used  . Alcohol use No  . Drug use: No  . Sexual activity: Not on file   Other Topics Concern  . Not on file   Social History Narrative  . No narrative on file    Past Surgical History:  Procedure Laterality Date  . 25 GAUGE PARS PLANA VITRECTOMY WITH 20 GAUGE MVR PORT FOR MACULAR HOLE Left 11/12/2012   Procedure: 25 GAUGE PARS PLANA VITRECTOMY WITH 20 GAUGE MVR PORT FOR MACULAR HOLE;  Surgeon: Sherrie George, MD;  Location: [facility] OR;  Service: Ophthalmology;  Laterality: Left;  . ABDOMINAL HYSTERECTOMY  1977  . CATARACT EXTRACTION W/PHACO Left 10/29/2013   Procedure: LEFT CATARACT  EXTRACTION PHACO AND INTRAOCULAR LENS PLACEMENT (IOC);  Surgeon: Chalmers Guest, MD;  Location[facility] OR;  Service: Ophthalmology;  Laterality: Left;  . CHOLECYSTECTOMY  1983  . EYE SURGERY    . GAS INSERTION Left 11/12/2012   Procedure: INSERTION OF GAS;  Surgeon: Sherrie George, MD;  Location[facility] OR;  Service: Ophthalmology;  Laterality: Left;  C3F8 (expires 11/2014)  . INSERT / REPLACE / REMOVE PACEMAKER     2008, DR BRIAN MUNLEY/Dr. Rudolpho Sevin  . KNEE ARTHROSCOPY     1990S  RT   . MEMBRANE PEEL Left 11/12/2012   Procedure: MEMBRANE PEEL;  Surgeon: Sherrie George, MD;  Location: [facility] OR;  Service: Ophthalmology;  Laterality: Left;  . PARS PLANA VITRECTOMY Left 11/12/2012   with macular hole    Dr Ashley Royalty  . PHOTOCOAGULATION WITH LASER Left 11/12/2012   Procedure: HEADSCOPE LASER;  Surgeon: Sherrie George, MD;  Location: [facility] OR;  Service: Ophthalmology;  Laterality: Left;  . SERUM PATCH Left 11/12/2012   Procedure: SERUM PATCH;  Surgeon: Sherrie George, MD;  Location: [facility] OR;  Service: Ophthalmology;  Laterality: Left;  . TONSILLECTOMY      No family history on file.   REVIEW OF SYSTEMS: As per chart  PHYSICAL EXAMINATION:  Today's Vitals   09/04/16 1019  BP: (!) 169/80  Pulse: 91  Resp: 18  Temp: 98.4 F (36.9 C)  TempSrc: Oral  SpO2: 100%  Weight: 225 lb (102.1 kg)    GENERAL: Well-developed, well-nourished, in no acute distress. Alert  and cooperative. EXTREMITIES: No peripheral edema or varicosities.  LOWER EXTREMITY EXAM: DERMATOLOGY: + preulcerative lesion sub met 5 on right, Skin warm and supple bilateral, no open lesions, nails within normal limits. VASCULAR: Dorsalis Pedis 1/4 and Posterior Tibial 1/4 pedal pulses bilateral, Temperature gradient within normal limits, Capillary fill time 5 seconds, positive pedal hair growth present bilateral. NEUROLOGY: Gross sensation present with light touch bilateral MUSCULOSKELETAL: +foot deformity/ prominent 5th met  head, + pain with palpation to area of concern  X-rays right foot. Decreased osseous mineralization. There is significant diffuse arthritis present, there is significant posterior and inferior calcaneal spurring and hammertoe deformity present. There is mild soft tissue swelling and large habitus noted. No other acute findings.  ASSESSMENTS:  1.Prominent 5th metatarsal head, right foot 2. Preulcerative callus right foot 3. Diabetic in Obese 4. CAD, coumadin held   PLAN OF CARE: Patient seen and evaluated 1. History and physical completed 2. Patient NPO since midnight 3. Previous Imaging reviewed  4. Consent for surgery explained and obtained for right 5th met head resection; risk and benefits explained; all questions answered and no guarantees granted. 5. Patient to undergo right 5th metatarsal head resection surgical procedure 6. Case discussed with patient and  to meet with family represented after the procedure 7. To resume all home meds post-procedure and to give prn pain meds, anti-nausea, and anti-constipation medications post op 8. Will continue to follow closely/ see post-operative in the office within 1 week.  Landis Martins, DPM

## 2016-09-04 NOTE — Transfer of Care (Signed)
Immediate Anesthesia Transfer of Care Note  Patient: [name]  Procedure(s) Performed: Procedure(s): OSTECTOMY, COMPLETE METATARSAL HEAD FIFTH RIGHT (Right)  Patient Location: PACU  Anesthesia Type:General  Level of Consciousness: drowsy and patient cooperative  Airway & Oxygen Therapy: Patient Spontanous Breathing and Patient connected to face mask oxygen  Post-op Assessment: Report given to RN and Post -op Vital signs reviewed and stable  Post vital signs: Reviewed and stable  Last Vitals:  Vitals:   09/04/16 1019 09/04/16 1413  BP: (!) 169/80   Pulse: 91   Resp: 18   Temp: 36.9 C (!) 36.4 C  SpO2: 100%     Last Pain:  Vitals:   09/04/16 1413  TempSrc: Tympanic         Complications: No apparent anesthesia complications

## 2016-09-04 NOTE — Brief Op Note (Signed)
09/04/2016  2:08 PM  PATIENT:  [name]  76 y.o. female  PRE-OPERATIVE DIAGNOSIS:  PROMINENT 5th METATARSAL HEAD  POST-OPERATIVE DIAGNOSIS:  PROMINENT 5th  METATARSAL HEAD  PROCEDURE:  Procedure(s): OSTECTOMY, COMPLETE METATARSAL HEAD FIFTH RIGHT (Right)  SURGEON:  Surgeon(s) and Role:    * Keenya Matera, Training and development officer, DPM - Primary  PHYSICIAN ASSISTANT:   ASSISTANTS: DR. PRICE   ANESTHESIA:   general with local injection of 1:1 mixture of 1% lidocaine plain and 0.5% marcaine plain, 10cc total   EBL:  No intake/output data recorded.  BLOOD ADMINISTERED:none  DRAINS: none   LOCAL MEDICATIONS USED:  MARCAINE AND LIDOCAINE   SPECIMEN:  No Specimen  DISPOSITION OF SPECIMEN:  N/A  COUNTS:  YES  TOURNIQUET:   Total Tourniquet Time Documented: Calf (Right) - 20 minutes Total: Calf (Right) - 20 minutes   DICTATION: .Note written in EPIC  PLAN OF CARE: Discharge to home after PACU  PATIENT DISPOSITION:  PACU - hemodynamically stable.   Delay start of Pharmacological VTE agent (>24hrs) due to surgical blood loss or risk of bleeding: no

## 2016-09-04 NOTE — Anesthesia Postprocedure Evaluation (Signed)
Anesthesia Post Note  Patient: INGRIS PASQUARELLA  Procedure(s) Performed: Procedure(s) (LRB): OSTECTOMY, COMPLETE METATARSAL HEAD FIFTH RIGHT (Right)     Patient location during evaluation: PACU Anesthesia Type: General Level of consciousness: awake and alert Pain management: pain level controlled Vital Signs Assessment: post-procedure vital signs reviewed and stable Respiratory status: spontaneous breathing, nonlabored ventilation and respiratory function stable Cardiovascular status: blood pressure returned to baseline and stable Postop Assessment: no signs of nausea or vomiting Anesthetic complications: no    Last Vitals:  Vitals:   09/04/16 1428 09/04/16 1430  BP: 128/62   Pulse: (!) 59 60  Resp: 16 13  Temp:    SpO2: 99% 99%    Last Pain:  Vitals:   09/04/16 1413  TempSrc: Tympanic                 Lynda Rainwater

## 2016-09-04 NOTE — Anesthesia Procedure Notes (Signed)
Procedure Name: LMA Insertion Date/Time: 09/04/2016 1:30 PM Performed by: Freddie Breech Pre-anesthesia Checklist: Patient identified, Emergency Drugs available, Suction available and Patient being monitored Patient Re-evaluated:Patient Re-evaluated prior to induction Oxygen Delivery Method: Circle System Utilized Preoxygenation: Pre-oxygenation with 100% oxygen Induction Type: IV induction Ventilation: Mask ventilation without difficulty LMA: LMA inserted LMA Size: 4.0 Number of attempts: 1 Airway Equipment and Method: Bite block Placement Confirmation: positive ETCO2 Tube secured with: Tape Dental Injury: Teeth and Oropharynx as per pre-operative assessment

## 2016-09-05 ENCOUNTER — Telehealth: Payer: Self-pay | Admitting: Sports Medicine

## 2016-09-05 ENCOUNTER — Encounter (HOSPITAL_COMMUNITY): Payer: Self-pay | Admitting: Sports Medicine

## 2016-09-05 NOTE — Telephone Encounter (Signed)
Post op check phone call made to patient. Patient reports that she is doing fine with no issues. Took tylenol last night and is using walker when she stands to help keep her balanced and keep some pressure off her surgical foot. Patient denies any other symptoms or acute issues. Patient to follow up as scheduled in office on next week for continued post op care. -Dr. Cannon Kettle

## 2016-09-08 DIAGNOSIS — Z7901 Long term (current) use of anticoagulants: Secondary | ICD-10-CM | POA: Diagnosis not present

## 2016-09-13 ENCOUNTER — Encounter (INDEPENDENT_AMBULATORY_CARE_PROVIDER_SITE_OTHER): Payer: Self-pay

## 2016-09-13 ENCOUNTER — Ambulatory Visit (INDEPENDENT_AMBULATORY_CARE_PROVIDER_SITE_OTHER): Payer: PPO | Admitting: Sports Medicine

## 2016-09-13 ENCOUNTER — Ambulatory Visit (INDEPENDENT_AMBULATORY_CARE_PROVIDER_SITE_OTHER): Payer: PPO

## 2016-09-13 DIAGNOSIS — E119 Type 2 diabetes mellitus without complications: Secondary | ICD-10-CM

## 2016-09-13 DIAGNOSIS — M216X1 Other acquired deformities of right foot: Secondary | ICD-10-CM

## 2016-09-13 DIAGNOSIS — Z9889 Other specified postprocedural states: Secondary | ICD-10-CM

## 2016-09-13 DIAGNOSIS — I739 Peripheral vascular disease, unspecified: Secondary | ICD-10-CM

## 2016-09-13 DIAGNOSIS — M79671 Pain in right foot: Secondary | ICD-10-CM

## 2016-09-13 NOTE — Progress Notes (Signed)
Subjective: [name] is a 76 y.o. female patient seen today in office for POV #1 (DOS 09/04/2016), S/P fifth metatarsal head resection, right foot. Patient denies pain at surgical site, denies calf pain, denies headache, chest pain, shortness of breath, nausea, vomiting, fever, or chills. Patient states that she is doing well and is only taking Tylenol. However, not really had to take any medication. No other issues noted.   Patient Active Problem List   Diagnosis Date Noted  . Chronic anticoagulation 07/17/2016  . Chronic diastolic heart failure ([place]) 07/17/2016  . Hypertensive heart failure ([place]) 07/17/2016  . Chronic atrial fibrillation ([place]) 10/21/2014  . Dyslipidemia 10/21/2014  . Essential (primary) hypertension 10/21/2014  . Type 2 diabetes mellitus ([place]) 10/21/2014  . Atrial fibrillation ([place]) 08/13/2014  . Pacemaker 08/13/2014  . Arthritis of knee, degenerative 11/20/2013  . Gonalgia 11/20/2013  . Macular hole, left eye 10/23/2012    Current Outpatient Prescriptions on File Prior to Visit  Medication Sig Dispense Refill  . acetaminophen (TYLENOL) 500 MG tablet Take 1,000 mg by mouth at bedtime as needed (knee pain).     Marland Kitchen ALPRAZolam (XANAX) 0.25 MG tablet Take 0.25 mg by mouth at bedtime.    Marland Kitchen amLODipine (NORVASC) 5 MG tablet Take 5 mg by mouth daily.    Marland Kitchen aspirin EC 81 MG tablet Take 81 mg by mouth daily.    . benazepril (LOTENSIN) 40 MG tablet Take 40 mg by mouth daily.    . Cholecalciferol (VITAMIN D) 2000 UNITS CAPS Take 2,000 Units by mouth daily.    Marland Kitchen docusate sodium (COLACE) 100 MG capsule Take 1 capsule (100 mg total) by mouth 2 (two) times daily. 10 capsule 0  . estradiol (ESTRACE) 0.5 MG tablet Take 0.5 mg by mouth daily.    . furosemide (LASIX) 40 MG tablet Take 40 mg by mouth daily. May take additional dose if needed    . gabapentin (NEURONTIN) 800 MG tablet Take 800 mg by mouth at bedtime.     Marland Kitchen glipiZIDE (GLUCOTROL XL) 5 MG 24 hr tablet Take 5 mg by mouth  daily with breakfast.     . hydrochlorothiazide (MICROZIDE) 12.5 MG capsule Take 12.5 mg by mouth at bedtime.     . Lactobacillus (ACIDOPHILUS) CAPS capsule Take 1 capsule by mouth daily.    Marland Kitchen levothyroxine (SYNTHROID, LEVOTHROID) 100 MCG tablet Take 100 mcg by mouth daily before breakfast.    . lovastatin (MEVACOR) 40 MG tablet Take 40 mg by mouth 2 (two) times daily.    . metFORMIN (GLUCOPHAGE) 500 MG tablet Take 500 mg by mouth daily with breakfast.    . metoprolol succinate (TOPROL-XL) 50 MG 24 hr tablet Take 50 mg by mouth daily. Take with or immediately following a meal.    . omeprazole (PRILOSEC) 20 MG capsule Take 20 mg by mouth daily.    . potassium chloride (MICRO-K) 10 MEQ CR capsule Take 10 mEq by mouth 2 (two) times daily.     . promethazine (PHENERGAN) 12.5 MG tablet Take 1 tablet (12.5 mg total) by mouth every 6 (six) hours as needed for nausea or vomiting. 30 tablet 0  . tiZANidine (ZANAFLEX) 4 MG tablet Take 2 mg by mouth at bedtime.    Marland Kitchen warfarin (COUMADIN) 3 MG tablet Take 3-6 mg by mouth daily at 6 PM. Take 2 tablets (6 mg) by mouth on Monday, Wednsday, Friday, take 1 tablet (3 mg) on Tuesday and Thursday, &  Saturday and Sunday  No current facility-administered medications on file prior to visit.     No Known Allergies  Objective: There were no vitals filed for this visit.  General: No acute distress, AAOx3  Right foot: Sutures intact with no gapping or dehiscence at surgical site, mild swelling and ecchymosis to the right forefoot, no erythema, no warmth, no drainage, no signs of infection noted, Capillary fill time <3 seconds in all digits, gross sensation present via light touch to right foot. No pain or crepitation with range of motion right foot.  No pain with calf compression.   Post Op Xray, Right foot: Fifth metatarsal head resection proximal to surgical neck, consistent with post op status, no other acute findings. Soft tissue swelling within normal limits for  post op status.   Assessment and Plan:  Problem List Items Addressed This Visit    None    Visit Diagnoses    Prominent metatarsal head of right foot    -  Primary   Relevant Orders   DG Foot Complete Right   S/P foot surgery, right       Right foot pain       Diabetes mellitus without complication (HCC)       PVD (peripheral vascular disease) ([place])          -Patient seen and evaluated -Right foot x-rays reviewed  -Applied dry sterile dressing to surgical site right foot secured with ACE wrap and stockinet  -Advised patient to make sure to keep dressings clean, dry, and intact to right surgical site, removing the ACE as needed  -Advised patient to continue with post-op shoe on right foot   -Advised patient to limit activity to necessity  -Advised patient to ice 3 times per day and continue with elevation to assist with edema and ecchymosis reduction  -Will plan for suture removal at next office visit. In the meantime, patient to call office if any issues or problems arise.   Landis Martins, DPM

## 2016-09-19 DIAGNOSIS — Z7901 Long term (current) use of anticoagulants: Secondary | ICD-10-CM | POA: Diagnosis not present

## 2016-09-20 ENCOUNTER — Ambulatory Visit (INDEPENDENT_AMBULATORY_CARE_PROVIDER_SITE_OTHER): Payer: PPO | Admitting: Sports Medicine

## 2016-09-20 ENCOUNTER — Encounter (INDEPENDENT_AMBULATORY_CARE_PROVIDER_SITE_OTHER): Payer: Self-pay

## 2016-09-20 DIAGNOSIS — M79671 Pain in right foot: Secondary | ICD-10-CM

## 2016-09-20 DIAGNOSIS — E119 Type 2 diabetes mellitus without complications: Secondary | ICD-10-CM

## 2016-09-20 DIAGNOSIS — M216X1 Other acquired deformities of right foot: Secondary | ICD-10-CM

## 2016-09-20 DIAGNOSIS — Z9889 Other specified postprocedural states: Secondary | ICD-10-CM

## 2016-09-20 NOTE — Progress Notes (Signed)
Subjective: [name] is a 76 y.o. female patient seen today in office for POV #2 (DOS 09/04/2016), S/P fifth metatarsal head resection, right foot. Patient denies pain at surgical site, denies calf pain, denies headache, chest pain, shortness of breath, nausea, vomiting, fever, or chills. Patient states that she is doing well; Not in any pain. No other issues noted.   Patient Active Problem List   Diagnosis Date Noted  . Chronic anticoagulation 07/17/2016  . Chronic diastolic heart failure ([place]) 07/17/2016  . Hypertensive heart failure ([place]) 07/17/2016  . Chronic atrial fibrillation ([place]) 10/21/2014  . Dyslipidemia 10/21/2014  . Essential (primary) hypertension 10/21/2014  . Type 2 diabetes mellitus ([place]) 10/21/2014  . Atrial fibrillation ([place]) 08/13/2014  . Pacemaker 08/13/2014  . Arthritis of knee, degenerative 11/20/2013  . Gonalgia 11/20/2013  . Macular hole, left eye 10/23/2012    Current Outpatient Prescriptions on File Prior to Visit  Medication Sig Dispense Refill  . acetaminophen (TYLENOL) 500 MG tablet Take 1,000 mg by mouth at bedtime as needed (knee pain).     Marland Kitchen ALPRAZolam (XANAX) 0.25 MG tablet Take 0.25 mg by mouth at bedtime.    Marland Kitchen amLODipine (NORVASC) 5 MG tablet Take 5 mg by mouth daily.    Marland Kitchen aspirin EC 81 MG tablet Take 81 mg by mouth daily.    . benazepril (LOTENSIN) 40 MG tablet Take 40 mg by mouth daily.    . Cholecalciferol (VITAMIN D) 2000 UNITS CAPS Take 2,000 Units by mouth daily.    Marland Kitchen docusate sodium (COLACE) 100 MG capsule Take 1 capsule (100 mg total) by mouth 2 (two) times daily. 10 capsule 0  . estradiol (ESTRACE) 0.5 MG tablet Take 0.5 mg by mouth daily.    . furosemide (LASIX) 40 MG tablet Take 40 mg by mouth daily. May take additional dose if needed    . gabapentin (NEURONTIN) 800 MG tablet Take 800 mg by mouth at bedtime.     Marland Kitchen glipiZIDE (GLUCOTROL XL) 5 MG 24 hr tablet Take 5 mg by mouth daily with breakfast.     . hydrochlorothiazide  (MICROZIDE) 12.5 MG capsule Take 12.5 mg by mouth at bedtime.     . Lactobacillus (ACIDOPHILUS) CAPS capsule Take 1 capsule by mouth daily.    Marland Kitchen levothyroxine (SYNTHROID, LEVOTHROID) 100 MCG tablet Take 100 mcg by mouth daily before breakfast.    . lovastatin (MEVACOR) 40 MG tablet Take 40 mg by mouth 2 (two) times daily.    . metFORMIN (GLUCOPHAGE) 500 MG tablet Take 500 mg by mouth daily with breakfast.    . metoprolol succinate (TOPROL-XL) 50 MG 24 hr tablet Take 50 mg by mouth daily. Take with or immediately following a meal.    . omeprazole (PRILOSEC) 20 MG capsule Take 20 mg by mouth daily.    . potassium chloride (MICRO-K) 10 MEQ CR capsule Take 10 mEq by mouth 2 (two) times daily.     . promethazine (PHENERGAN) 12.5 MG tablet Take 1 tablet (12.5 mg total) by mouth every 6 (six) hours as needed for nausea or vomiting. 30 tablet 0  . tiZANidine (ZANAFLEX) 4 MG tablet Take 2 mg by mouth at bedtime.    Marland Kitchen warfarin (COUMADIN) 3 MG tablet Take 3-6 mg by mouth daily at 6 PM. Take 2 tablets (6 mg) by mouth on Monday, Wednsday, Friday, take 1 tablet (3 mg) on Tuesday and Thursday, &  Saturday and Sunday     No current facility-administered medications on file prior to  visit.     No Known Allergies  Objective: There were no vitals filed for this visit.  General: No acute distress, AAOx3  Right foot: Sutures intact with no gapping or dehiscence at surgical site, mild swelling and ecchymosis to the right forefoot, no erythema, no warmth, no drainage, no signs of infection noted, Capillary fill time <3 seconds in all digits, gross sensation present via light touch to right foot. No pain or crepitation with range of motion right foot.  No pain with calf compression.   Assessment and Plan:  Problem List Items Addressed This Visit    None    Visit Diagnoses    S/P foot surgery, right    -  Primary   Prominent metatarsal head of right foot       Right foot pain       Diabetes mellitus without  complication ([place])          -Patient seen and evaluated -Sutures removed  -Applied dry sterile dressing to surgical site right foot secured with ACE wrap and stockinet  -Advised patient to make sure to keep dressings clean, dry, and intact to right surgical site, for today; May shower on tomorrow allowing steri-strips to fall off  -Advised patient to continue with post-op shoe on right foot  X 1 week with no driving then afterwards may drive by switching out of her post op shoe  -Advised patient to limit activity to necessity  -Advised patient to ice 3 times per day and continue with elevation to assist with edema and ecchymosis reduction  -Will plan for xray and transitioning out of post op shoe at next office visit. In the meantime, patient to call office if any issues or problems arise.   Landis Martins, DPM

## 2016-10-04 ENCOUNTER — Encounter (INDEPENDENT_AMBULATORY_CARE_PROVIDER_SITE_OTHER): Payer: Self-pay

## 2016-10-04 ENCOUNTER — Ambulatory Visit (INDEPENDENT_AMBULATORY_CARE_PROVIDER_SITE_OTHER): Payer: PPO | Admitting: Sports Medicine

## 2016-10-04 ENCOUNTER — Ambulatory Visit (INDEPENDENT_AMBULATORY_CARE_PROVIDER_SITE_OTHER): Payer: PPO

## 2016-10-04 DIAGNOSIS — I739 Peripheral vascular disease, unspecified: Secondary | ICD-10-CM

## 2016-10-04 DIAGNOSIS — M79671 Pain in right foot: Secondary | ICD-10-CM

## 2016-10-04 DIAGNOSIS — E119 Type 2 diabetes mellitus without complications: Secondary | ICD-10-CM

## 2016-10-04 DIAGNOSIS — M216X1 Other acquired deformities of right foot: Secondary | ICD-10-CM

## 2016-10-04 DIAGNOSIS — Z9889 Other specified postprocedural states: Secondary | ICD-10-CM

## 2016-10-04 DIAGNOSIS — Z7901 Long term (current) use of anticoagulants: Secondary | ICD-10-CM | POA: Diagnosis not present

## 2016-10-04 NOTE — Progress Notes (Signed)
Subjective: [name] is a 76 y.o. female patient seen today in office for POV #3 (DOS 09/04/2016), S/P fifth metatarsal head resection, right foot. Patient denies pain at surgical site, denies calf pain, denies headache, chest pain, shortness of breath, nausea, vomiting, fever, or chills. Patient states that she is doing well; Not in any pain. No other issues noted.   Patient Active Problem List   Diagnosis Date Noted  . Chronic anticoagulation 07/17/2016  . Chronic diastolic heart failure ([place]) 07/17/2016  . Hypertensive heart failure ([place]) 07/17/2016  . Chronic atrial fibrillation ([place]) 10/21/2014  . Dyslipidemia 10/21/2014  . Essential (primary) hypertension 10/21/2014  . Type 2 diabetes mellitus ([place]) 10/21/2014  . Atrial fibrillation ([place]) 08/13/2014  . Pacemaker 08/13/2014  . Arthritis of knee, degenerative 11/20/2013  . Gonalgia 11/20/2013  . Macular hole, left eye 10/23/2012    Current Outpatient Prescriptions on File Prior to Visit  Medication Sig Dispense Refill  . acetaminophen (TYLENOL) 500 MG tablet Take 1,000 mg by mouth at bedtime as needed (knee pain).     Marland Kitchen ALPRAZolam (XANAX) 0.25 MG tablet Take 0.25 mg by mouth at bedtime.    Marland Kitchen amLODipine (NORVASC) 5 MG tablet Take 5 mg by mouth daily.    Marland Kitchen aspirin EC 81 MG tablet Take 81 mg by mouth daily.    . benazepril (LOTENSIN) 40 MG tablet Take 40 mg by mouth daily.    . Cholecalciferol (VITAMIN D) 2000 UNITS CAPS Take 2,000 Units by mouth daily.    Marland Kitchen docusate sodium (COLACE) 100 MG capsule Take 1 capsule (100 mg total) by mouth 2 (two) times daily. 10 capsule 0  . estradiol (ESTRACE) 0.5 MG tablet Take 0.5 mg by mouth daily.    . furosemide (LASIX) 40 MG tablet Take 40 mg by mouth daily. May take additional dose if needed    . gabapentin (NEURONTIN) 800 MG tablet Take 800 mg by mouth at bedtime.     Marland Kitchen glipiZIDE (GLUCOTROL XL) 5 MG 24 hr tablet Take 5 mg by mouth daily with breakfast.     . hydrochlorothiazide  (MICROZIDE) 12.5 MG capsule Take 12.5 mg by mouth at bedtime.     . Lactobacillus (ACIDOPHILUS) CAPS capsule Take 1 capsule by mouth daily.    Marland Kitchen levothyroxine (SYNTHROID, LEVOTHROID) 100 MCG tablet Take 100 mcg by mouth daily before breakfast.    . lovastatin (MEVACOR) 40 MG tablet Take 40 mg by mouth 2 (two) times daily.    . metFORMIN (GLUCOPHAGE) 500 MG tablet Take 500 mg by mouth daily with breakfast.    . metoprolol succinate (TOPROL-XL) 50 MG 24 hr tablet Take 50 mg by mouth daily. Take with or immediately following a meal.    . omeprazole (PRILOSEC) 20 MG capsule Take 20 mg by mouth daily.    . potassium chloride (MICRO-K) 10 MEQ CR capsule Take 10 mEq by mouth 2 (two) times daily.     . promethazine (PHENERGAN) 12.5 MG tablet Take 1 tablet (12.5 mg total) by mouth every 6 (six) hours as needed for nausea or vomiting. 30 tablet 0  . tiZANidine (ZANAFLEX) 4 MG tablet Take 2 mg by mouth at bedtime.    Marland Kitchen warfarin (COUMADIN) 3 MG tablet Take 3-6 mg by mouth daily at 6 PM. Take 2 tablets (6 mg) by mouth on Monday, Wednsday, Friday, take 1 tablet (3 mg) on Tuesday and Thursday, &  Saturday and Sunday     No current facility-administered medications on file prior to  visit.     No Known Allergies  Objective: There were no vitals filed for this visit.  General: No acute distress, AAOx3  Right foot: Incision intact and healing well with no gapping or dehiscence at surgical site, mild swelling and ecchymosis to the right forefoot, no erythema, no warmth, no drainage, no signs of infection noted, Capillary fill time <3 seconds in all digits, gross sensation present via light touch to right foot. No pain or crepitation with range of motion right foot.  No pain with calf compression.   Xrays right foot consistent with post op status, no other acute findings.  Assessment and Plan:  Problem List Items Addressed This Visit    None    Visit Diagnoses    Prominent metatarsal head of right foot    -   Primary   Relevant Orders   DG Foot Complete Right   S/P foot surgery, right       Right foot pain       Diabetes mellitus without complication (HCC)       PVD (peripheral vascular disease) ([place])          -Patient seen and evaluated -Xrays reviewed  -Encouraged skin emollients -Continue with post op shoe until her diabetic shoes are dispensed (gave patient diabetic shoe forms to take to her PCP to have signed off on)  -Advised patient to limit activity to necessity  -Advised patient to ice 3 times per day and continue with elevation to assist with edema and ecchymosis reduction  -Will plan for transitioning out of post op shoe to diabetic shoes at next office visit. In the meantime, patient to call office if any issues or problems arise.   Landis Martins, DPM

## 2016-10-11 DIAGNOSIS — S59901A Unspecified injury of right elbow, initial encounter: Secondary | ICD-10-CM | POA: Diagnosis not present

## 2016-10-12 DIAGNOSIS — M25521 Pain in right elbow: Secondary | ICD-10-CM | POA: Diagnosis not present

## 2016-10-12 DIAGNOSIS — Z01818 Encounter for other preprocedural examination: Secondary | ICD-10-CM | POA: Diagnosis not present

## 2016-10-12 DIAGNOSIS — S52021A Displaced fracture of olecranon process without intraarticular extension of right ulna, initial encounter for closed fracture: Secondary | ICD-10-CM | POA: Diagnosis not present

## 2016-10-12 DIAGNOSIS — I480 Paroxysmal atrial fibrillation: Secondary | ICD-10-CM | POA: Diagnosis not present

## 2016-10-16 NOTE — Progress Notes (Unsigned)
DOS: 09/04/16 Removal of Bone/5th metatarsal head, Right foot to prevent ulceration

## 2016-10-19 DIAGNOSIS — S52021A Displaced fracture of olecranon process without intraarticular extension of right ulna, initial encounter for closed fracture: Secondary | ICD-10-CM | POA: Diagnosis not present

## 2016-10-25 ENCOUNTER — Ambulatory Visit (INDEPENDENT_AMBULATORY_CARE_PROVIDER_SITE_OTHER): Payer: PPO | Admitting: Sports Medicine

## 2016-10-25 DIAGNOSIS — M79671 Pain in right foot: Secondary | ICD-10-CM

## 2016-10-25 DIAGNOSIS — M216X1 Other acquired deformities of right foot: Secondary | ICD-10-CM

## 2016-10-25 DIAGNOSIS — Z9889 Other specified postprocedural states: Secondary | ICD-10-CM

## 2016-10-25 DIAGNOSIS — E119 Type 2 diabetes mellitus without complications: Secondary | ICD-10-CM

## 2016-10-25 DIAGNOSIS — I739 Peripheral vascular disease, unspecified: Secondary | ICD-10-CM

## 2016-10-25 NOTE — Progress Notes (Signed)
Subjective: [name] is a 76 y.o. female patient seen today in office for POV #4 (DOS 09/04/2016), S/P fifth metatarsal head resection, right foot. Patient denies pain at surgical site, denies calf pain, denies headache, chest pain, shortness of breath, nausea, vomiting, fever, or chills. Patient states that 2 weeks ago she fell and broke her arm after tripping on mat at elevator at her apartments. Reports no pain in lower extremity. Reports that she does have some weakness with movement of the 5th toe but otherwise is doing ok.   Patient Active Problem List   Diagnosis Date Noted  . Chronic anticoagulation 07/17/2016  . Chronic diastolic heart failure ([place]) 07/17/2016  . Hypertensive heart failure ([place]) 07/17/2016  . Chronic atrial fibrillation ([place]) 10/21/2014  . Dyslipidemia 10/21/2014  . Essential (primary) hypertension 10/21/2014  . Type 2 diabetes mellitus ([place]) 10/21/2014  . Atrial fibrillation ([place]) 08/13/2014  . Pacemaker 08/13/2014  . Arthritis of knee, degenerative 11/20/2013  . Gonalgia 11/20/2013  . Macular hole, left eye 10/23/2012    Current Outpatient Prescriptions on File Prior to Visit  Medication Sig Dispense Refill  . acetaminophen (TYLENOL) 500 MG tablet Take 1,000 mg by mouth at bedtime as needed (knee pain).     Marland Kitchen ALPRAZolam (XANAX) 0.25 MG tablet Take 0.25 mg by mouth at bedtime.    Marland Kitchen amLODipine (NORVASC) 5 MG tablet Take 5 mg by mouth daily.    Marland Kitchen aspirin EC 81 MG tablet Take 81 mg by mouth daily.    . benazepril (LOTENSIN) 40 MG tablet Take 40 mg by mouth daily.    . Cholecalciferol (VITAMIN D) 2000 UNITS CAPS Take 2,000 Units by mouth daily.    Marland Kitchen docusate sodium (COLACE) 100 MG capsule Take 1 capsule (100 mg total) by mouth 2 (two) times daily. 10 capsule 0  . estradiol (ESTRACE) 0.5 MG tablet Take 0.5 mg by mouth daily.    . furosemide (LASIX) 40 MG tablet Take 40 mg by mouth daily. May take additional dose if needed    . gabapentin (NEURONTIN) 800 MG  tablet Take 800 mg by mouth at bedtime.     Marland Kitchen glipiZIDE (GLUCOTROL XL) 5 MG 24 hr tablet Take 5 mg by mouth daily with breakfast.     . hydrochlorothiazide (MICROZIDE) 12.5 MG capsule Take 12.5 mg by mouth at bedtime.     . Lactobacillus (ACIDOPHILUS) CAPS capsule Take 1 capsule by mouth daily.    Marland Kitchen levothyroxine (SYNTHROID, LEVOTHROID) 100 MCG tablet Take 100 mcg by mouth daily before breakfast.    . lovastatin (MEVACOR) 40 MG tablet Take 40 mg by mouth 2 (two) times daily.    . metFORMIN (GLUCOPHAGE) 500 MG tablet Take 500 mg by mouth daily with breakfast.    . metoprolol succinate (TOPROL-XL) 50 MG 24 hr tablet Take 50 mg by mouth daily. Take with or immediately following a meal.    . omeprazole (PRILOSEC) 20 MG capsule Take 20 mg by mouth daily.    . potassium chloride (MICRO-K) 10 MEQ CR capsule Take 10 mEq by mouth 2 (two) times daily.     . promethazine (PHENERGAN) 12.5 MG tablet Take 1 tablet (12.5 mg total) by mouth every 6 (six) hours as needed for nausea or vomiting. 30 tablet 0  . tiZANidine (ZANAFLEX) 4 MG tablet Take 2 mg by mouth at bedtime.    Marland Kitchen warfarin (COUMADIN) 3 MG tablet Take 3-6 mg by mouth daily at 6 PM. Take 2 tablets (6 mg) by mouth  on Monday, Wednsday, Friday, take 1 tablet (3 mg) on Tuesday and Thursday, &  Saturday and Sunday     No current facility-administered medications on file prior to visit.     No Known Allergies  Objective: There were no vitals filed for this visit.  General: No acute distress, AAOx3  Right foot: Incision well healed with no gapping or dehiscence at surgical site, mild swelling and ecchymosis that is slowly getting better to the right forefoot, mild flakey skin sub met 5 on right, no erythema, no warmth, no drainage, no signs of infection noted, Capillary fill time <3 seconds in all digits, gross sensation present via light touch to right foot. No pain or crepitation with range of motion right foot. Mild weakness with extension on right at  5th toe.  No pain with calf compression.    Assessment and Plan:  Problem List Items Addressed This Visit    None    Visit Diagnoses    Prominent metatarsal head of right foot    -  Primary   S/P foot surgery, right       Right foot pain       Diabetes mellitus without complication (HCC)       PVD (peripheral vascular disease) ([place])          -Patient seen and evaluated -Trimmed loose skin and applied antibiotic cream and bandaid; advised patient to do the same as needed -Complimentary trimmed patients nails and advised to wait for about 1 month before she goes for pedicure  -Encourage range of motion exercises and advised patient that it will take time to get strength back in toe after surgery  -Patient to wear comfortable shoes until her diabetic shoe paperwork has been signed off on by her PCP; will hold her shoes until we received certification paperwork -Advised patient to limit activity to tolerance -Advised patient to ice 3 times per day and continue with elevation to assist with edema and ecchymosis reduction  -Will plan for final xray and post op check in 5 weeks. In the meantime, patient to call office if any issues or problems arise.   Landis Martins, DPM

## 2016-10-26 DIAGNOSIS — S52021A Displaced fracture of olecranon process without intraarticular extension of right ulna, initial encounter for closed fracture: Secondary | ICD-10-CM | POA: Diagnosis not present

## 2016-11-01 DIAGNOSIS — S52021A Displaced fracture of olecranon process without intraarticular extension of right ulna, initial encounter for closed fracture: Secondary | ICD-10-CM | POA: Diagnosis not present

## 2016-11-06 DIAGNOSIS — Z7901 Long term (current) use of anticoagulants: Secondary | ICD-10-CM | POA: Diagnosis not present

## 2016-11-06 DIAGNOSIS — E119 Type 2 diabetes mellitus without complications: Secondary | ICD-10-CM | POA: Diagnosis not present

## 2016-11-06 DIAGNOSIS — Z79899 Other long term (current) drug therapy: Secondary | ICD-10-CM | POA: Diagnosis not present

## 2016-11-06 DIAGNOSIS — K219 Gastro-esophageal reflux disease without esophagitis: Secondary | ICD-10-CM | POA: Diagnosis not present

## 2016-11-06 DIAGNOSIS — I10 Essential (primary) hypertension: Secondary | ICD-10-CM | POA: Diagnosis not present

## 2016-11-06 DIAGNOSIS — E039 Hypothyroidism, unspecified: Secondary | ICD-10-CM | POA: Diagnosis not present

## 2016-11-06 DIAGNOSIS — I480 Paroxysmal atrial fibrillation: Secondary | ICD-10-CM | POA: Diagnosis not present

## 2016-11-06 DIAGNOSIS — E785 Hyperlipidemia, unspecified: Secondary | ICD-10-CM | POA: Diagnosis not present

## 2016-11-23 DIAGNOSIS — S52021A Displaced fracture of olecranon process without intraarticular extension of right ulna, initial encounter for closed fracture: Secondary | ICD-10-CM | POA: Diagnosis not present

## 2016-11-29 ENCOUNTER — Ambulatory Visit (INDEPENDENT_AMBULATORY_CARE_PROVIDER_SITE_OTHER): Payer: PPO

## 2016-11-29 ENCOUNTER — Ambulatory Visit (INDEPENDENT_AMBULATORY_CARE_PROVIDER_SITE_OTHER): Payer: PPO | Admitting: Sports Medicine

## 2016-11-29 ENCOUNTER — Encounter: Payer: Self-pay | Admitting: Sports Medicine

## 2016-11-29 DIAGNOSIS — M2141 Flat foot [pes planus] (acquired), right foot: Secondary | ICD-10-CM

## 2016-11-29 DIAGNOSIS — M2142 Flat foot [pes planus] (acquired), left foot: Secondary | ICD-10-CM

## 2016-11-29 DIAGNOSIS — Z7901 Long term (current) use of anticoagulants: Secondary | ICD-10-CM | POA: Diagnosis not present

## 2016-11-29 DIAGNOSIS — E119 Type 2 diabetes mellitus without complications: Secondary | ICD-10-CM

## 2016-11-29 DIAGNOSIS — M216X1 Other acquired deformities of right foot: Secondary | ICD-10-CM

## 2016-11-29 DIAGNOSIS — R7989 Other specified abnormal findings of blood chemistry: Secondary | ICD-10-CM | POA: Diagnosis not present

## 2016-11-29 DIAGNOSIS — L84 Corns and callosities: Secondary | ICD-10-CM | POA: Diagnosis not present

## 2016-11-29 NOTE — Patient Instructions (Signed)

## 2016-11-29 NOTE — Progress Notes (Signed)
Patient ID: [name], female   DOB: 14-Oct-1940, 76 y.o.   MRN: [id number]   Patient presents for diabetic shoe pick up, shoes are tried on for good fit.  Patient received 1 pair Orthofeet [address] grey women's size 9.5 medium and 3 pairs custom molded diabetic inserts.  Verbal and written break in and wear instructions given.

## 2016-11-29 NOTE — Progress Notes (Signed)
Subjective: [name] is a 76 y.o. female patient seen today in office for POV #5 (DOS 09/04/2016), S/P fifth metatarsal head resection, right foot. Patient denies pain at surgical site, denies calf pain, denies headache, chest pain, shortness of breath, nausea, vomiting, fever, or chills. Patient states that she is having pain in her right hand after her fall however otherwise she is doing okay.  Patient is here also for diabetic shoes.  Patient Active Problem List   Diagnosis Date Noted  . Chronic anticoagulation 07/17/2016  . Chronic diastolic heart failure ([place]) 07/17/2016  . Hypertensive heart failure ([place]) 07/17/2016  . Chronic atrial fibrillation ([place]) 10/21/2014  . Dyslipidemia 10/21/2014  . Essential (primary) hypertension 10/21/2014  . Type 2 diabetes mellitus ([place]) 10/21/2014  . Atrial fibrillation ([place]) 08/13/2014  . Pacemaker 08/13/2014  . Arthritis of knee, degenerative 11/20/2013  . Gonalgia 11/20/2013  . Macular hole, left eye 10/23/2012    Current Outpatient Medications on File Prior to Visit  Medication Sig Dispense Refill  . acetaminophen (TYLENOL) 500 MG tablet Take 1,000 mg by mouth at bedtime as needed (knee pain).     Marland Kitchen ALPRAZolam (XANAX) 0.25 MG tablet Take 0.25 mg by mouth at bedtime.    Marland Kitchen amLODipine (NORVASC) 5 MG tablet Take 5 mg by mouth daily.    Marland Kitchen aspirin EC 81 MG tablet Take 81 mg by mouth daily.    . benazepril (LOTENSIN) 40 MG tablet Take 40 mg by mouth daily.    . Cholecalciferol (VITAMIN D) 2000 UNITS CAPS Take 2,000 Units by mouth daily.    Marland Kitchen docusate sodium (COLACE) 100 MG capsule Take 1 capsule (100 mg total) by mouth 2 (two) times daily. 10 capsule 0  . estradiol (ESTRACE) 0.5 MG tablet Take 0.5 mg by mouth daily.    . furosemide (LASIX) 40 MG tablet Take 40 mg by mouth daily. May take additional dose if needed    . gabapentin (NEURONTIN) 800 MG tablet Take 800 mg by mouth at bedtime.     Marland Kitchen glipiZIDE (GLUCOTROL XL) 5 MG 24 hr tablet Take 5  mg by mouth daily with breakfast.     . hydrochlorothiazide (MICROZIDE) 12.5 MG capsule Take 12.5 mg by mouth at bedtime.     . Lactobacillus (ACIDOPHILUS) CAPS capsule Take 1 capsule by mouth daily.    Marland Kitchen levothyroxine (SYNTHROID, LEVOTHROID) 100 MCG tablet Take 100 mcg by mouth daily before breakfast.    . lovastatin (MEVACOR) 40 MG tablet Take 40 mg by mouth 2 (two) times daily.    . metFORMIN (GLUCOPHAGE) 500 MG tablet Take 500 mg by mouth daily with breakfast.    . metoprolol succinate (TOPROL-XL) 50 MG 24 hr tablet Take 50 mg by mouth daily. Take with or immediately following a meal.    . omeprazole (PRILOSEC) 20 MG capsule Take 20 mg by mouth daily.    . potassium chloride (MICRO-K) 10 MEQ CR capsule Take 10 mEq by mouth 2 (two) times daily.     . promethazine (PHENERGAN) 12.5 MG tablet Take 1 tablet (12.5 mg total) by mouth every 6 (six) hours as needed for nausea or vomiting. 30 tablet 0  . tiZANidine (ZANAFLEX) 4 MG tablet Take 2 mg by mouth at bedtime.    Marland Kitchen warfarin (COUMADIN) 3 MG tablet Take 3-6 mg by mouth daily at 6 PM. Take 2 tablets (6 mg) by mouth on Monday, Wednsday, Friday, take 1 tablet (3 mg) on Tuesday and Thursday, &  Saturday and Sunday  No current facility-administered medications on file prior to visit.     No Known Allergies  Objective: There were no vitals filed for this visit.  General: No acute distress, AAOx3  Right foot: Incision well healed with no gapping or dehiscence at surgical site, mild swelling and resolving ecchymosis, resolved flakey skin sub met 5 on right, no erythema, no warmth, no drainage, no signs of infection noted, Capillary fill time <3 seconds in all digits, gross sensation present via light touch to right foot. No pain or crepitation with range of motion right foot. Mild weakness with extension on right at 5th toe.  No pain with calf compression.   X-rays consistent with postop status no acute changes   Assessment and Plan:  Problem  List Items Addressed This Visit    None    Visit Diagnoses    Diabetes mellitus without complication ([place])    -  Primary   Prominent metatarsal head of right foot       Relevant Orders   DG Foot Complete Right (Completed)   Pes planus of both feet       Pre-ulcerative calluses          -Patient seen and evaluated -X-rays reviewed -Surgical site well-healed -Dispensed to patient diabetic shoes; see assistant note -Advised patient to limit activity to tolerance -Will plan for shoe check and diabetic foot care in 8 weeks. In the meantime, patient to call office if any issues or problems arise.   Landis Martins, DPM

## 2016-12-14 DIAGNOSIS — Z45018 Encounter for adjustment and management of other part of cardiac pacemaker: Secondary | ICD-10-CM | POA: Diagnosis not present

## 2016-12-14 DIAGNOSIS — I442 Atrioventricular block, complete: Secondary | ICD-10-CM | POA: Diagnosis not present

## 2016-12-28 DIAGNOSIS — S52021A Displaced fracture of olecranon process without intraarticular extension of right ulna, initial encounter for closed fracture: Secondary | ICD-10-CM | POA: Diagnosis not present

## 2016-12-29 DIAGNOSIS — S52021A Displaced fracture of olecranon process without intraarticular extension of right ulna, initial encounter for closed fracture: Secondary | ICD-10-CM | POA: Diagnosis not present

## 2017-01-01 DIAGNOSIS — Z7901 Long term (current) use of anticoagulants: Secondary | ICD-10-CM | POA: Diagnosis not present

## 2017-01-02 ENCOUNTER — Ambulatory Visit: Payer: PPO | Admitting: Cardiology

## 2017-01-02 ENCOUNTER — Encounter: Payer: Self-pay | Admitting: Cardiology

## 2017-01-02 VITALS — BP 148/80 | HR 77 | Ht 62.0 in | Wt 228.0 lb

## 2017-01-02 DIAGNOSIS — I110 Hypertensive heart disease with heart failure: Secondary | ICD-10-CM | POA: Diagnosis not present

## 2017-01-02 DIAGNOSIS — Z7901 Long term (current) use of anticoagulants: Secondary | ICD-10-CM | POA: Diagnosis not present

## 2017-01-02 DIAGNOSIS — I482 Chronic atrial fibrillation, unspecified: Secondary | ICD-10-CM

## 2017-01-02 DIAGNOSIS — I5032 Chronic diastolic (congestive) heart failure: Secondary | ICD-10-CM

## 2017-01-02 NOTE — Patient Instructions (Signed)
Medication Instructions:  Your physician recommends that you continue on your current medications as directed. Please refer to the Current Medication list given to you today.  Labwork: None  Testing/Procedures: None  Follow-Up: Your physician wants you to follow-up in: 7 months. You will receive a reminder letter in the mail two months in advance. If you don't receive a letter, please call our office to schedule the follow-up appointment.  Any Other Special Instructions Will Be Listed Below (If Applicable).     If you need a refill on your cardiac medications before your next appointment, please call your pharmacy.

## 2017-01-02 NOTE — Progress Notes (Signed)
Cardiology Office Note:    Date:  01/02/2017   ID:  [name], DOB January 14, 1941, MRN [id number]  PCP:  Ernestene Kiel, MD  Cardiologist:  Shirlee More, MD    Referring MD: Ernestene Kiel, MD    ASSESSMENT:    1. Chronic atrial fibrillation ([place])   2. Chronic anticoagulation   3. Chronic diastolic heart failure ([place])   4. Hypertensive heart failure ([place])    PLAN:    In order of problems listed above:  1. Stable compensated continue her current diuretic.  Recent labs renal function potassium requested from her PCP 2. Stable she wishes to transition from warfarin to a direct anticoagulant after the first of the year will contact my office after her next pro time. 3. Atrial fibrillation stable rate controlled after AV nodal ablation she is pacemaker dependent recent device check showed 9/2 years of battery and normal function and continue current treatment with pacemaker follow-up and anticoagulation 4. Stable continue current antihypertensive agents   Next appointment: 6 months   Medication Adjustments/Labs and Tests Ordered: Current medicines are reviewed at length with the patient today.  Concerns regarding medicines are outlined above.  No orders of the defined types were placed in this encounter.  No orders of the defined types were placed in this encounter.   Chief Complaint  Patient presents with  . Follow-up  . Atrial Fibrillation  . Congestive Heart Failure  . Hypertension  . Pacemaker Problem    History of Present Illness:    [name] is a 76 y.o. female with a hx of CHF, Chronic Atrial Fibrillation with AV nodal ablation on warfarin, HTN, and a pacemaker  last seen more than 1 year ago. Compliance with diet, lifestyle and medications: Yes She continues to be very active caring for others.  She has no shortness of breath edema orthopnea palpitation or syncope but has had falls and trauma. Past Medical History:  Diagnosis Date  .  Arthritis    SPURS, DDD, NECK  . Diabetes mellitus without complication [facility])    dx  2008 ?  Marland Kitchen Dysrhythmia    afib ([facility], Dr. Bettina Gavia)  . Edema    legs , left leg weeping, is wrapped 10/28/13  . GERD (gastroesophageal reflux disease)   . H/O hiatal hernia   . Headache    h/o 'muscular vascular' headaches  . Hypertension   . Hypothyroidism   . Mucous colitis   . Pacemaker   . Phlebitis    hx of  . Pulmonary hypertension ([place])   . Shortness of breath    due to pulmonary hypeertension  . Stroke [facility])    1998    Past Surgical History:  Procedure Laterality Date  . [place] VITRECTOMY WITH 20 GAUGE MVR PORT FOR MACULAR HOLE Left 11/12/2012   Procedure: 25 GAUGE PARS PLANA VITRECTOMY WITH 20 GAUGE MVR PORT FOR MACULAR HOLE;  Surgeon: Hayden Pedro, MD;  Location: [place];  Service: Ophthalmology;  Laterality: Left;  . ABDOMINAL HYSTERECTOMY  1977  . CATARACT EXTRACTION W/PHACO Left 10/29/2013   Procedure: LEFT CATARACT EXTRACTION PHACO AND INTRAOCULAR LENS PLACEMENT (IOC);  Surgeon: [name], MD;  Location: [place];  Service: Ophthalmology;  Laterality: Left;  . CHOLECYSTECTOMY  1983  . EYE SURGERY    . GAS INSERTION Left 11/12/2012   Procedure: INSERTION OF GAS;  Surgeon: Hayden Pedro, MD;  Location: [place];  Service: Ophthalmology;  Laterality: Left;  C3F8 (expires 11/2014)  .  INSERT / REPLACE / REMOVE PACEMAKER     2008, DR Malka Bocek/Dr. Minna Merritts  . KNEE ARTHROSCOPY     1990S  RT   . MEMBRANE PEEL Left 11/12/2012   Procedure: MEMBRANE PEEL;  Surgeon: Hayden Pedro, MD;  Location: [place];  Service: Ophthalmology;  Laterality: Left;  . OSTECTOMY Right 09/04/2016   Procedure: OSTECTOMY, COMPLETE METATARSAL HEAD FIFTH RIGHT;  Surgeon: Landis Martins, DPM;  Location: [place];  Service: Podiatry;  Laterality: Right;  . PARS PLANA VITRECTOMY Left 11/12/2012   with macular hole    Dr Zigmund Daniel  . PHOTOCOAGULATION WITH LASER Left 11/12/2012   Procedure:  HEADSCOPE LASER;  Surgeon: Hayden Pedro, MD;  Location: [place];  Service: Ophthalmology;  Laterality: Left;  . SERUM PATCH Left 11/12/2012   Procedure: SERUM PATCH;  Surgeon: Hayden Pedro, MD;  Location: [place];  Service: Ophthalmology;  Laterality: Left;  . TONSILLECTOMY      Current Medications: Current Meds  Medication Sig  . acetaminophen (TYLENOL) 500 MG tablet Take 1,000 mg by mouth at bedtime as needed (knee pain).   Marland Kitchen ALPRAZolam (XANAX) 0.25 MG tablet Take 0.25 mg by mouth at bedtime.  Marland Kitchen amLODipine (NORVASC) 5 MG tablet Take 5 mg by mouth daily.  Marland Kitchen aspirin EC 81 MG tablet Take 81 mg by mouth daily.  . benazepril (LOTENSIN) 40 MG tablet Take 40 mg by mouth daily.  . Cholecalciferol (VITAMIN D) 2000 UNITS CAPS Take 2,000 Units by mouth daily.  Marland Kitchen docusate sodium (COLACE) 100 MG capsule Take 1 capsule (100 mg total) by mouth 2 (two) times daily.  Marland Kitchen estradiol (ESTRACE) 0.5 MG tablet Take 0.5 mg by mouth daily.  . furosemide (LASIX) 40 MG tablet Take 40 mg by mouth daily. May take additional dose if needed  . gabapentin (NEURONTIN) 800 MG tablet Take 800 mg by mouth at bedtime.   Marland Kitchen glipiZIDE (GLUCOTROL XL) 5 MG 24 hr tablet Take 5 mg by mouth daily with breakfast.   . hydrochlorothiazide (MICROZIDE) 12.5 MG capsule Take 12.5 mg by mouth at bedtime.   . Lactobacillus (ACIDOPHILUS) CAPS capsule Take 1 capsule by mouth daily.  Marland Kitchen levothyroxine (SYNTHROID, LEVOTHROID) 100 MCG tablet Take 100 mcg by mouth daily before breakfast.  . lovastatin (MEVACOR) 40 MG tablet Take 40 mg by mouth 2 (two) times daily.  . metFORMIN (GLUCOPHAGE) 500 MG tablet Take 500 mg by mouth daily with breakfast.  . metoprolol succinate (TOPROL-XL) 50 MG 24 hr tablet Take 50 mg by mouth daily. Take with or immediately following a meal.  . omeprazole (PRILOSEC) 20 MG capsule Take 20 mg by mouth daily.  . potassium chloride (MICRO-K) 10 MEQ CR capsule Take 10 mEq by mouth 2 (two) times daily.   . promethazine  (PHENERGAN) 12.5 MG tablet Take 1 tablet (12.5 mg total) by mouth every 6 (six) hours as needed for nausea or vomiting.  Marland Kitchen tiZANidine (ZANAFLEX) 4 MG tablet Take 2 mg by mouth at bedtime.  Marland Kitchen warfarin (COUMADIN) 3 MG tablet Take 3-6 mg by mouth daily at 6 PM. Take 2 tablets (6 mg) by mouth on Monday, Wednsday, Friday, take 1 tablet (3 mg) on Tuesday and Thursday, &  Saturday and Sunday     Allergies:   Patient has no known allergies.   Social History   Socioeconomic History  . Marital status: Widowed    Spouse name: None  . Number of children: None  . Years of education: None  . Highest education  level: None  Social Needs  . Financial resource strain: None  . Food insecurity - worry: None  . Food insecurity - inability: None  . Transportation needs - medical: None  . Transportation needs - non-medical: None  Occupational History  . None  Tobacco Use  . Smoking status: Never Smoker  . Smokeless tobacco: Never Used  Substance and Sexual Activity  . Alcohol use: No  . Drug use: No  . Sexual activity: None  Other Topics Concern  . None  Social History Narrative  . None     Family History: The patient's family history is not on file. ROS:   Please see the history of present illness.    All other systems reviewed and are negative.  EKGs/Labs/Other Studies Reviewed:    The following studies were reviewed today:  Device check:  REMOTE MONITORING ASSESSMENT Date of Transmission: August 28, 2016 Patient MR Number: [id number] Patient Name: RAEGHAN DEMETER, 1940/08/06, 76 y.o. Following Provider: Mahala Menghini, MD Primary Care Provider: Druscilla Brownie PROCHNAU, MD Manufacturer of Device: Medtronic Type of Device: Single Chamber Pacemaker Presenting Rhythm: VP 77 Percentage RV Pacing: 97.3% RV Paced Percentage Biventricular Pacing: Not Applicable Device Findings:  Normal Follow Up  Battery Status Adequate Battery Voltage  Lead Trends Lead Trends Stable  Anticoagulation  History: Warfarin / Coumadin and Aspirin  Ventricular Tachycardia/Fibrillation Burden: Non-Sustained Ventricular Tachycardia  1 VHR 10 sec at 197 bpm   Recent Labs: 08/31/2016: BUN 40; Creatinine, Ser 1.33; Hemoglobin 13.1; Platelets 208; Potassium 4.3; Sodium 139  Recent Lipid Panel No results found for: CHOL, TRIG, HDL, CHOLHDL, VLDL, LDLCALC, LDLDIRECT  Physical Exam:    VS:  BP (!) 160/82 (BP Location: Right Arm, Patient Position: Sitting, Cuff Size: Large)   Pulse 77   Ht 5\' 2"  (1.575 m)   Wt 228 lb (103.4 kg)   SpO2 97%   BMI 41.70 kg/m     Wt Readings from Last 3 Encounters:  01/02/17 228 lb (103.4 kg)  09/04/16 225 lb (102.1 kg)  08/31/16 224 lb (101.6 kg)     GEN:  Well nourished, well developed in no acute distress HEENT: Normal NECK: No JVD; No carotid bruits LYMPHATICS: No lymphadenopathy CARDIAC: variable S1 RRR, no murmurs, rubs, gallops RESPIRATORY:  Clear to auscultation without rales, wheezing or rhonchi  ABDOMEN: Soft, non-tender, non-distended MUSCULOSKELETAL:  1-2+ edema; No deformity  SKIN: Warm and dry NEUROLOGIC:  Alert and oriented x 3 PSYCHIATRIC:  Normal affect    Signed, Shirlee More, MD  01/02/2017 1:29 PM    [facility]

## 2017-01-17 ENCOUNTER — Ambulatory Visit: Payer: PPO | Admitting: Sports Medicine

## 2017-01-18 DIAGNOSIS — Z7901 Long term (current) use of anticoagulants: Secondary | ICD-10-CM | POA: Diagnosis not present

## 2017-01-19 ENCOUNTER — Ambulatory Visit: Payer: PPO | Admitting: Sports Medicine

## 2017-01-19 ENCOUNTER — Telehealth: Payer: Self-pay

## 2017-01-19 NOTE — Telephone Encounter (Signed)
Attempted to return patient's call in regards to surgery scheduling. There was no answer and no VM was able to be left. Patient will need an appt for a surgical consult to sign consent forms and discuss surgery with Dr Cannon Kettle

## 2017-02-19 DIAGNOSIS — Z7901 Long term (current) use of anticoagulants: Secondary | ICD-10-CM | POA: Diagnosis not present

## 2017-03-12 DIAGNOSIS — Z79899 Other long term (current) drug therapy: Secondary | ICD-10-CM | POA: Diagnosis not present

## 2017-03-12 DIAGNOSIS — Z7901 Long term (current) use of anticoagulants: Secondary | ICD-10-CM | POA: Diagnosis not present

## 2017-03-12 DIAGNOSIS — E785 Hyperlipidemia, unspecified: Secondary | ICD-10-CM | POA: Diagnosis not present

## 2017-03-12 DIAGNOSIS — I10 Essential (primary) hypertension: Secondary | ICD-10-CM | POA: Diagnosis not present

## 2017-03-12 DIAGNOSIS — E039 Hypothyroidism, unspecified: Secondary | ICD-10-CM | POA: Diagnosis not present

## 2017-03-12 DIAGNOSIS — E119 Type 2 diabetes mellitus without complications: Secondary | ICD-10-CM | POA: Diagnosis not present

## 2017-03-20 DIAGNOSIS — E039 Hypothyroidism, unspecified: Secondary | ICD-10-CM | POA: Diagnosis not present

## 2017-03-20 DIAGNOSIS — I10 Essential (primary) hypertension: Secondary | ICD-10-CM | POA: Diagnosis not present

## 2017-03-20 DIAGNOSIS — J029 Acute pharyngitis, unspecified: Secondary | ICD-10-CM | POA: Diagnosis not present

## 2017-03-20 DIAGNOSIS — E785 Hyperlipidemia, unspecified: Secondary | ICD-10-CM | POA: Diagnosis not present

## 2017-03-20 DIAGNOSIS — I480 Paroxysmal atrial fibrillation: Secondary | ICD-10-CM | POA: Diagnosis not present

## 2017-03-20 DIAGNOSIS — E1169 Type 2 diabetes mellitus with other specified complication: Secondary | ICD-10-CM | POA: Diagnosis not present

## 2017-03-20 DIAGNOSIS — G4700 Insomnia, unspecified: Secondary | ICD-10-CM | POA: Diagnosis not present

## 2017-03-21 DIAGNOSIS — Z7901 Long term (current) use of anticoagulants: Secondary | ICD-10-CM | POA: Diagnosis not present

## 2017-03-21 DIAGNOSIS — H9211 Otorrhea, right ear: Secondary | ICD-10-CM | POA: Diagnosis not present

## 2017-04-09 DIAGNOSIS — Z7901 Long term (current) use of anticoagulants: Secondary | ICD-10-CM | POA: Diagnosis not present

## 2017-05-10 DIAGNOSIS — Z7901 Long term (current) use of anticoagulants: Secondary | ICD-10-CM | POA: Diagnosis not present

## 2017-06-12 DIAGNOSIS — Z7901 Long term (current) use of anticoagulants: Secondary | ICD-10-CM | POA: Diagnosis not present

## 2017-06-15 DIAGNOSIS — Z950 Presence of cardiac pacemaker: Secondary | ICD-10-CM | POA: Diagnosis not present

## 2017-06-21 DIAGNOSIS — R609 Edema, unspecified: Secondary | ICD-10-CM | POA: Diagnosis not present

## 2017-06-21 DIAGNOSIS — Z7901 Long term (current) use of anticoagulants: Secondary | ICD-10-CM | POA: Diagnosis not present

## 2017-06-21 DIAGNOSIS — R0902 Hypoxemia: Secondary | ICD-10-CM | POA: Diagnosis not present

## 2017-06-21 DIAGNOSIS — E039 Hypothyroidism, unspecified: Secondary | ICD-10-CM | POA: Diagnosis not present

## 2017-06-21 DIAGNOSIS — R51 Headache: Secondary | ICD-10-CM | POA: Diagnosis not present

## 2017-06-21 DIAGNOSIS — Z8673 Personal history of transient ischemic attack (TIA), and cerebral infarction without residual deficits: Secondary | ICD-10-CM | POA: Diagnosis not present

## 2017-06-21 DIAGNOSIS — S0990XA Unspecified injury of head, initial encounter: Secondary | ICD-10-CM | POA: Diagnosis not present

## 2017-06-21 DIAGNOSIS — S0101XA Laceration without foreign body of scalp, initial encounter: Secondary | ICD-10-CM | POA: Diagnosis not present

## 2017-06-21 DIAGNOSIS — I4891 Unspecified atrial fibrillation: Secondary | ICD-10-CM | POA: Diagnosis not present

## 2017-06-21 DIAGNOSIS — E119 Type 2 diabetes mellitus without complications: Secondary | ICD-10-CM | POA: Diagnosis not present

## 2017-06-21 DIAGNOSIS — I10 Essential (primary) hypertension: Secondary | ICD-10-CM | POA: Diagnosis not present

## 2017-06-21 DIAGNOSIS — W19XXXA Unspecified fall, initial encounter: Secondary | ICD-10-CM | POA: Diagnosis not present

## 2017-06-21 DIAGNOSIS — Z7982 Long term (current) use of aspirin: Secondary | ICD-10-CM | POA: Diagnosis not present

## 2017-07-16 DIAGNOSIS — Z7901 Long term (current) use of anticoagulants: Secondary | ICD-10-CM | POA: Diagnosis not present

## 2017-07-31 DIAGNOSIS — Z7901 Long term (current) use of anticoagulants: Secondary | ICD-10-CM | POA: Diagnosis not present

## 2017-07-31 DIAGNOSIS — Z79899 Other long term (current) drug therapy: Secondary | ICD-10-CM | POA: Diagnosis not present

## 2017-08-02 ENCOUNTER — Ambulatory Visit: Payer: Medicare HMO | Admitting: Cardiology

## 2017-08-02 ENCOUNTER — Encounter: Payer: Self-pay | Admitting: Cardiology

## 2017-08-02 VITALS — BP 138/76 | HR 72 | Ht 62.0 in | Wt 220.0 lb

## 2017-08-02 DIAGNOSIS — Z7901 Long term (current) use of anticoagulants: Secondary | ICD-10-CM | POA: Diagnosis not present

## 2017-08-02 DIAGNOSIS — I5032 Chronic diastolic (congestive) heart failure: Secondary | ICD-10-CM

## 2017-08-02 DIAGNOSIS — I482 Chronic atrial fibrillation, unspecified: Secondary | ICD-10-CM

## 2017-08-02 DIAGNOSIS — I110 Hypertensive heart disease with heart failure: Secondary | ICD-10-CM | POA: Diagnosis not present

## 2017-08-02 DIAGNOSIS — Z950 Presence of cardiac pacemaker: Secondary | ICD-10-CM

## 2017-08-02 NOTE — Progress Notes (Signed)
Cardiology Office Note:    Date:  08/02/2017   ID:  [name], DOB November 15, 1940, MRN [id number]  PCP:  Ernestene Kiel, MD  Cardiologist:  Shirlee More, MD    Referring MD: Ernestene Kiel, MD    ASSESSMENT:    1. Chronic diastolic heart failure ([place])   2. Hypertensive heart disease with heart failure ([place])   3. Chronic atrial fibrillation ([place])   4. Pacemaker   5. Chronic anticoagulation    PLAN:    In order of problems listed above:  1. Stable well compensated continue current loop diuretic sodium restriction and self-management Daily weights 2. Blood pressure stable continue current treatment diuretic beta-blocker ARB calcium channel blocker 3. Stable rate controlled after AV nodal ablation continue warfarin INR goal 2.5 4. She is pacemaker dependent stable function referred to EP in my practice for device follow-up and management 5. Stable continue her current anticoagulant managed with her PCP 6. Dyslipidemia stable continue her statin 7. Hypothyroidism stable continue current supplement   Next appointment: 6 months   Medication Adjustments/Labs and Tests Ordered: Current medicines are reviewed at length with the patient today.  Concerns regarding medicines are outlined above.  No orders of the defined types were placed in this encounter.  No orders of the defined types were placed in this encounter.   No chief complaint on file.   History of Present Illness:    [name] is a 77 y.o. female with a hx of CHF, Chronic Atrial Fibrillation with AV nodal ablation on warfarin, HTN, and a pacemaker last seen 01/02/17. Compliance with diet, lifestyle and medications: yes  She has had a fall since her last visit.  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  She has had no chest pain dyspnea palpitations syncope TIA.  She request transition of device care to my practice and made a consultation to EP.  She has made decision not to use the new  anticoagulants due to cost Past Medical History:  Diagnosis Date  . Arthritis    SPURS, DDD, NECK  . Diabetes mellitus without complication [facility])    dx  2008 ?  Marland Kitchen Dysrhythmia    afib ([facility], Dr. Bettina Gavia)  . Edema    legs , left leg weeping, is wrapped 10/28/13  . GERD (gastroesophageal reflux disease)   . H/O hiatal hernia   . Headache    h/o 'muscular vascular' headaches  . Hypertension   . Hypothyroidism   . Mucous colitis   . Pacemaker   . Phlebitis    hx of  . Pulmonary hypertension ([place])   . Shortness of breath    due to pulmonary hypeertension  . Stroke [facility])    1998    Past Surgical History:  Procedure Laterality Date  . [place] VITRECTOMY WITH 20 GAUGE MVR PORT FOR MACULAR HOLE Left 11/12/2012   Procedure: 25 GAUGE PARS PLANA VITRECTOMY WITH 20 GAUGE MVR PORT FOR MACULAR HOLE;  Surgeon: Hayden Pedro, MD;  Location: [place];  Service: Ophthalmology;  Laterality: Left;  . ABDOMINAL HYSTERECTOMY  1977  . CATARACT EXTRACTION W/PHACO Left 10/29/2013   Procedure: LEFT CATARACT EXTRACTION PHACO AND INTRAOCULAR LENS PLACEMENT (IOC);  Surgeon: [name], MD;  Location: [place];  Service: Ophthalmology;  Laterality: Left;  . CHOLECYSTECTOMY  1983  . EYE SURGERY    . GAS INSERTION Left 11/12/2012   Procedure: INSERTION OF GAS;  Surgeon: Hayden Pedro, MD;  Location: [place];  Service: Ophthalmology;  Laterality: Left;  C3F8 (expires 11/2014)  . INSERT / REPLACE / REMOVE PACEMAKER     2008, DR Jomaira Darr/Dr. Minna Merritts  . KNEE ARTHROSCOPY     1990S  RT   . MEMBRANE PEEL Left 11/12/2012   Procedure: MEMBRANE PEEL;  Surgeon: Hayden Pedro, MD;  Location: [place];  Service: Ophthalmology;  Laterality: Left;  . OSTECTOMY Right 09/04/2016   Procedure: OSTECTOMY, COMPLETE METATARSAL HEAD FIFTH RIGHT;  Surgeon: Landis Martins, DPM;  Location: [place];  Service: Podiatry;  Laterality: Right;  . PARS PLANA VITRECTOMY Left 11/12/2012   with macular hole    Dr Zigmund Daniel   . PHOTOCOAGULATION WITH LASER Left 11/12/2012   Procedure: HEADSCOPE LASER;  Surgeon: Hayden Pedro, MD;  Location: [place];  Service: Ophthalmology;  Laterality: Left;  . SERUM PATCH Left 11/12/2012   Procedure: SERUM PATCH;  Surgeon: Hayden Pedro, MD;  Location: [place];  Service: Ophthalmology;  Laterality: Left;  . TONSILLECTOMY      Current Medications: No outpatient medications have been marked as taking for the 08/02/17 encounter (Appointment) with Richardo Priest, MD.     Allergies:   Patient has no known allergies.   Social History   Socioeconomic History  . Marital status: Widowed    Spouse name: Not on file  . Number of children: Not on file  . Years of education: Not on file  . Highest education level: Not on file  Occupational History  . Not on file  Social Needs  . Financial resource strain: Not on file  . Food insecurity:    Worry: Not on file    Inability: Not on file  . Transportation needs:    Medical: Not on file    Non-medical: Not on file  Tobacco Use  . Smoking status: Never Smoker  . Smokeless tobacco: Never Used  Substance and Sexual Activity  . Alcohol use: No  . Drug use: No  . Sexual activity: Not on file  Lifestyle  . Physical activity:    Days per week: Not on file    Minutes per session: Not on file  . Stress: Not on file  Relationships  . Social connections:    Talks on phone: Not on file    Gets together: Not on file    Attends religious service: Not on file    Active member of club or organization: Not on file    Attends meetings of clubs or organizations: Not on file    Relationship status: Not on file  Other Topics Concern  . Not on file  Social History Narrative  . Not on file     Family History: The patient's family history is not on file. ROS:   Please see the history of present illness.    All other systems reviewed and are negative.  EKGs/Labs/Other Studies Reviewed:    The following studies were reviewed  today:    Recent Labs: 08/31/2016: BUN 40; Creatinine, Ser 1.33; Hemoglobin 13.1; Platelets 208; Potassium 4.3; Sodium 139  Recent Lipid Panel No results found for: CHOL, TRIG, HDL, CHOLHDL, VLDL, LDLCALC, LDLDIRECT  Physical Exam:    VS:  There were no vitals taken for this visit.    Wt Readings from Last 3 Encounters:  01/02/17 228 lb (103.4 kg)  09/04/16 225 lb (102.1 kg)  08/31/16 224 lb (101.6 kg)     GEN:  Well nourished, well developed in no acute distress HEENT: Normal NECK: No JVD;  No carotid bruits LYMPHATICS: No lymphadenopathy CARDIAC: variable S1 RRR, no murmurs, rubs, gallops RESPIRATORY:  Clear to auscultation without rales, wheezing or rhonchi  ABDOMEN: Soft, non-tender, non-distended MUSCULOSKELETAL:  No edema; No deformity  SKIN: Warm and dry NEUROLOGIC:  Alert and oriented x 3 PSYCHIATRIC:  Normal affect    Signed, Shirlee More, MD  08/02/2017 8:19 AM    [place]

## 2017-08-02 NOTE — Patient Instructions (Signed)
Medication Instructions:  Your physician recommends that you continue on your current medications as directed. Please refer to the Current Medication list given to you today.   Labwork: NONE  Testing/Procedures: NONE  Follow-Up: Your physician wants you to follow-up in: 6 months. You will receive a reminder letter in the mail two months in advance. If you don't receive a letter, please call our office to schedule the follow-up appointment.  Dr Bettina Gavia would like you to see Dr Curt Bears in [place] after October.  Someone will contact you with an appointment.   Any Other Special Instructions Will Be Listed Below (If Applicable).     If you need a refill on your cardiac medications before your next appointment, please call your pharmacy.

## 2017-08-31 DIAGNOSIS — Z7901 Long term (current) use of anticoagulants: Secondary | ICD-10-CM | POA: Diagnosis not present

## 2017-09-14 DIAGNOSIS — Z950 Presence of cardiac pacemaker: Secondary | ICD-10-CM | POA: Diagnosis not present

## 2017-10-01 DIAGNOSIS — Z7901 Long term (current) use of anticoagulants: Secondary | ICD-10-CM | POA: Diagnosis not present

## 2017-11-01 DIAGNOSIS — Z79899 Other long term (current) drug therapy: Secondary | ICD-10-CM | POA: Diagnosis not present

## 2017-11-01 DIAGNOSIS — I10 Essential (primary) hypertension: Secondary | ICD-10-CM | POA: Diagnosis not present

## 2017-11-01 DIAGNOSIS — E785 Hyperlipidemia, unspecified: Secondary | ICD-10-CM | POA: Diagnosis not present

## 2017-11-01 DIAGNOSIS — Z7901 Long term (current) use of anticoagulants: Secondary | ICD-10-CM | POA: Diagnosis not present

## 2017-11-01 DIAGNOSIS — E1169 Type 2 diabetes mellitus with other specified complication: Secondary | ICD-10-CM | POA: Diagnosis not present

## 2017-11-01 DIAGNOSIS — Z6841 Body Mass Index (BMI) 40.0 and over, adult: Secondary | ICD-10-CM | POA: Diagnosis not present

## 2017-11-01 DIAGNOSIS — K219 Gastro-esophageal reflux disease without esophagitis: Secondary | ICD-10-CM | POA: Diagnosis not present

## 2017-11-01 DIAGNOSIS — E039 Hypothyroidism, unspecified: Secondary | ICD-10-CM | POA: Diagnosis not present

## 2017-11-06 ENCOUNTER — Encounter: Payer: Self-pay | Admitting: Cardiology

## 2017-11-15 DIAGNOSIS — Z7901 Long term (current) use of anticoagulants: Secondary | ICD-10-CM | POA: Diagnosis not present

## 2017-11-26 ENCOUNTER — Ambulatory Visit (INDEPENDENT_AMBULATORY_CARE_PROVIDER_SITE_OTHER): Payer: Medicare HMO | Admitting: Cardiology

## 2017-11-26 ENCOUNTER — Encounter: Payer: Self-pay | Admitting: Cardiology

## 2017-11-26 VITALS — BP 130/80 | HR 68 | Ht 62.0 in | Wt 231.0 lb

## 2017-11-26 DIAGNOSIS — I4821 Permanent atrial fibrillation: Secondary | ICD-10-CM | POA: Diagnosis not present

## 2017-11-26 DIAGNOSIS — I10 Essential (primary) hypertension: Secondary | ICD-10-CM | POA: Diagnosis not present

## 2017-11-26 DIAGNOSIS — I5032 Chronic diastolic (congestive) heart failure: Secondary | ICD-10-CM | POA: Diagnosis not present

## 2017-11-26 NOTE — Progress Notes (Signed)
Electrophysiology Office Note   Date:  11/26/2017   ID:  [name], DOB October 26, 1940, MRN [id number]  PCP:  Ernestene Kiel, MD  Cardiologist:  Bettina Gavia Primary Electrophysiologist:  Emiya Loomer Meredith Leeds, MD    No chief complaint on file.    History of Present Illness: [name] is a 77 y.o. female who is being seen today for the evaluation of atrial fibrillation/pacemaker at the request of Shirlee More. Presenting today for electrophysiology evaluation.  She has history of atrial fibrillation status post AV node ablation and Medtronic pacemaker implant, diabetes, hypertension, pulmonary hypertension, and stroke in 1998.  She has had a generator change in the past.  She is currently feeling well without any complaint.    Today, she denies symptoms of palpitations, chest pain, shortness of breath, orthopnea, PND, lower extremity edema, claudication, dizziness, presyncope, syncope, bleeding, or neurologic sequela. The patient is tolerating medications without difficulties.    Past Medical History:  Diagnosis Date  . Arthritis    SPURS, DDD, NECK  . Diabetes mellitus without complication [facility])    dx  2008 ?  Marland Kitchen Dysrhythmia    afib ([facility], Dr. Bettina Gavia)  . Edema    legs , left leg weeping, is wrapped 10/28/13  . GERD (gastroesophageal reflux disease)   . H/O hiatal hernia   . Headache    h/o 'muscular vascular' headaches  . Hypertension   . Hypothyroidism   . Mucous colitis   . Pacemaker   . Phlebitis    hx of  . Pulmonary hypertension ([place])   . Shortness of breath    due to pulmonary hypeertension  . Stroke [facility])    1998   Past Surgical History:  Procedure Laterality Date  . [place] VITRECTOMY WITH 20 GAUGE MVR PORT FOR MACULAR HOLE Left 11/12/2012   Procedure: 25 GAUGE PARS PLANA VITRECTOMY WITH 20 GAUGE MVR PORT FOR MACULAR HOLE;  Surgeon: Hayden Pedro, MD;  Location: [place];  Service: Ophthalmology;  Laterality: Left;  . ABDOMINAL  HYSTERECTOMY  1977  . CATARACT EXTRACTION W/PHACO Left 10/29/2013   Procedure: LEFT CATARACT EXTRACTION PHACO AND INTRAOCULAR LENS PLACEMENT (IOC);  Surgeon: [name], MD;  Location: [place];  Service: Ophthalmology;  Laterality: Left;  . CHOLECYSTECTOMY  1983  . EYE SURGERY    . GAS INSERTION Left 11/12/2012   Procedure: INSERTION OF GAS;  Surgeon: Hayden Pedro, MD;  Location: [place];  Service: Ophthalmology;  Laterality: Left;  C3F8 (expires 11/2014)  . INSERT / REPLACE / REMOVE PACEMAKER     2008, DR BRIAN MUNLEY/Dr. Minna Merritts  . KNEE ARTHROSCOPY     1990S  RT   . MEMBRANE PEEL Left 11/12/2012   Procedure: MEMBRANE PEEL;  Surgeon: Hayden Pedro, MD;  Location: [place];  Service: Ophthalmology;  Laterality: Left;  . OSTECTOMY Right 09/04/2016   Procedure: OSTECTOMY, COMPLETE METATARSAL HEAD FIFTH RIGHT;  Surgeon: Landis Martins, DPM;  Location: [place];  Service: Podiatry;  Laterality: Right;  . PARS PLANA VITRECTOMY Left 11/12/2012   with macular hole    Dr Zigmund Daniel  . PHOTOCOAGULATION WITH LASER Left 11/12/2012   Procedure: HEADSCOPE LASER;  Surgeon: Hayden Pedro, MD;  Location: [place];  Service: Ophthalmology;  Laterality: Left;  . SERUM PATCH Left 11/12/2012   Procedure: SERUM PATCH;  Surgeon: Hayden Pedro, MD;  Location: [place];  Service: Ophthalmology;  Laterality: Left;  . TONSILLECTOMY       Current Outpatient Medications  Medication Sig Dispense Refill  . acetaminophen (TYLENOL) 500 MG tablet Take 1,000 mg by mouth at bedtime as needed (knee pain).     Marland Kitchen ALPRAZolam (XANAX) 0.25 MG tablet Take 0.25 mg by mouth at bedtime.    Marland Kitchen amLODipine (NORVASC) 5 MG tablet Take 5 mg by mouth daily.    Marland Kitchen aspirin EC 81 MG tablet Take 81 mg by mouth daily.    . benazepril (LOTENSIN) 40 MG tablet Take 40 mg by mouth daily.    . Cholecalciferol (VITAMIN D) 2000 UNITS CAPS Take 2,000 Units by mouth daily.    Marland Kitchen estradiol (ESTRACE) 0.5 MG tablet Take 0.5 mg by mouth daily.    . furosemide  (LASIX) 40 MG tablet Take 40 mg by mouth daily. May take additional dose if needed    . gabapentin (NEURONTIN) 800 MG tablet Take 800 mg by mouth at bedtime.     Marland Kitchen glipiZIDE (GLUCOTROL XL) 5 MG 24 hr tablet Take 5 mg by mouth daily with breakfast.     . hydrochlorothiazide (MICROZIDE) 12.5 MG capsule Take 12.5 mg by mouth at bedtime.     . Lactobacillus (ACIDOPHILUS) CAPS capsule Take 1 capsule by mouth daily.    Marland Kitchen levothyroxine (SYNTHROID, LEVOTHROID) 100 MCG tablet Take 100 mcg by mouth daily before breakfast.    . lovastatin (MEVACOR) 40 MG tablet Take 40 mg by mouth 2 (two) times daily.    . metFORMIN (GLUCOPHAGE) 500 MG tablet Take 500 mg by mouth daily with breakfast.    . metoprolol succinate (TOPROL-XL) 50 MG 24 hr tablet Take 50 mg by mouth daily. Take with or immediately following a meal.    . omeprazole (PRILOSEC) 20 MG capsule Take 20 mg by mouth daily.    . potassium chloride (MICRO-K) 10 MEQ CR capsule Take 10 mEq by mouth 2 (two) times daily.     Marland Kitchen tiZANidine (ZANAFLEX) 4 MG tablet Take 2 mg by mouth at bedtime.    Marland Kitchen warfarin (COUMADIN) 3 MG tablet Take 3-6 mg by mouth daily at 6 PM. Take 2 tablets (6 mg) by mouth on Monday, Wednsday, Friday, take 1 tablet (3 mg) on Tuesday and Thursday, &  Saturday and Sunday     No current facility-administered medications for this visit.     Allergies:   Patient has no known allergies.   Social History:  The patient  reports that she has never smoked. She has never used smokeless tobacco. She reports that she does not drink alcohol or use drugs.   Family History:  The patient's family history includes Heart attack in her father; Hypertension in her father.    ROS:  Please see the history of present illness.   Otherwise, review of systems is positive for none.   All other systems are reviewed and negative.    PHYSICAL EXAM: VS:  BP 130/80   Pulse 68   Ht 5\' 2"  (1.575 m)   Wt 231 lb (104.8 kg)   BMI 42.25 kg/m  , BMI Body mass index  is 42.25 kg/m. GEN: Well nourished, well developed, in no acute distress  HEENT: normal  Neck: no JVD, carotid bruits, or masses Cardiac: RRR; no murmurs, rubs, or gallops,no edema  Respiratory:  clear to auscultation bilaterally, normal work of breathing GI: soft, nontender, nondistended, + BS MS: no deformity or atrophy  Skin: warm and dry, device pocket is well healed Neuro:  Strength and sensation are intact Psych: euthymic mood, full affect  EKG:  EKG is ordered today. Personal review of the ekg ordered shows AF, ventricular paced   Device interrogation is reviewed today in detail.  See PaceArt for details.   Recent Labs: No results found for requested labs within last 8760 hours.    Lipid Panel  No results found for: CHOL, TRIG, HDL, CHOLHDL, VLDL, LDLCALC, LDLDIRECT   Wt Readings from Last 3 Encounters:  11/26/17 231 lb (104.8 kg)  08/02/17 220 lb (99.8 kg)  01/02/17 228 lb (103.4 kg)      Other studies Reviewed: Additional studies/ records that were reviewed today include: epic notes   ASSESSMENT AND PLAN:  1.  Permanent atrial fibrillation: Currently on warfarin and he did not wish to switch to a novel anticoagulant.  She has had an AV nodal ablation and now has a Medtronic pacemaker implant.  Device functioning appropriately.  No changes at this time.  This patients CHA2DS2-VASc Score and unadjusted Ischemic Stroke Rate (% per year) is equal to 9.7 % stroke rate/year from a score of 6  Above score calculated as 1 point each if present [CHF, HTN, DM, Vascular=MI/PAD/Aortic Plaque, Age if 65-74, or Female] Above score calculated as 2 points each if present [Age > 75, or Stroke/TIA/TE]  2.  Hypertensive heart disease with heart failure: Well-controlled.  No changes at this time.  3.  Chronic diastolic heart failure: No signs of volume overload.  Current medicines are reviewed at length with the patient today.   The patient does not have concerns regarding  her medicines.  The following changes were made today:  none  Labs/ tests ordered today include:  Orders Placed This Encounter  Procedures  . EKG 12-Lead     Disposition:   FU with Rc [place] 1 year  Signed, Braheem Tomasik Meredith Leeds, MD  11/26/2017 9:14 AM     [facility] [address] [place] [address] [phone] (office) [phone] (fax)

## 2017-11-26 NOTE — Patient Instructions (Signed)
Medication Instructions:  Your physician recommends that you continue on your current medications as directed. Please refer to the Current Medication list given to you today.  *If you need a refill on your cardiac medications before your next appointment, please call your pharmacy*  Labwork: None ordered  Testing/Procedures: None ordered  Follow-Up: Remote monitoring is used to monitor your Pacemaker or ICD from home. This monitoring reduces the number of office visits required to check your device to one time per year. It allows [place] to keep an eye on the functioning of your device to ensure it is working properly. You are scheduled for a device check from home on 02/25/2018. You may send your transmission at any time that day. If you have a wireless device, the transmission will be sent automatically. After your physician reviews your transmission, you will receive a postcard with your next transmission date.  Your physician wants you to follow-up in: 1 year with Dr. Curt Bears.  You will receive a reminder letter in the mail two months in advance. If you don't receive a letter, please call our office to schedule the follow-up appointment.  Thank you for choosing [facility]!!   [name], RN [phone]  Any Other Special Instructions Will Be Listed Below (If Applicable).

## 2017-11-28 DIAGNOSIS — Z7901 Long term (current) use of anticoagulants: Secondary | ICD-10-CM | POA: Diagnosis not present

## 2017-12-18 DIAGNOSIS — L03116 Cellulitis of left lower limb: Secondary | ICD-10-CM | POA: Diagnosis not present

## 2017-12-18 DIAGNOSIS — Z6841 Body Mass Index (BMI) 40.0 and over, adult: Secondary | ICD-10-CM | POA: Diagnosis not present

## 2017-12-19 DIAGNOSIS — E113292 Type 2 diabetes mellitus with mild nonproliferative diabetic retinopathy without macular edema, left eye: Secondary | ICD-10-CM | POA: Diagnosis not present

## 2017-12-24 DIAGNOSIS — S61217A Laceration without foreign body of left little finger without damage to nail, initial encounter: Secondary | ICD-10-CM | POA: Diagnosis not present

## 2017-12-28 DIAGNOSIS — Z7901 Long term (current) use of anticoagulants: Secondary | ICD-10-CM | POA: Diagnosis not present

## 2017-12-28 DIAGNOSIS — E039 Hypothyroidism, unspecified: Secondary | ICD-10-CM | POA: Diagnosis not present

## 2017-12-28 DIAGNOSIS — Z6841 Body Mass Index (BMI) 40.0 and over, adult: Secondary | ICD-10-CM | POA: Diagnosis not present

## 2017-12-28 DIAGNOSIS — S61217A Laceration without foreign body of left little finger without damage to nail, initial encounter: Secondary | ICD-10-CM | POA: Diagnosis not present

## 2018-01-14 DIAGNOSIS — Z79899 Other long term (current) drug therapy: Secondary | ICD-10-CM | POA: Diagnosis not present

## 2018-01-14 DIAGNOSIS — I872 Venous insufficiency (chronic) (peripheral): Secondary | ICD-10-CM | POA: Diagnosis not present

## 2018-01-14 DIAGNOSIS — Z6841 Body Mass Index (BMI) 40.0 and over, adult: Secondary | ICD-10-CM | POA: Diagnosis not present

## 2018-01-21 NOTE — Progress Notes (Signed)
Cardiology Office Note:    Date:  01/22/2018   ID:  [name], DOB [birth year], MRN [id number]  PCP:  Ernestene Kiel, MD  Cardiologist:  Shirlee More, MD    Referring MD: Ernestene Kiel, MD    ASSESSMENT:    1. Chronic atrial fibrillation   2. Chronic anticoagulation   3. Pacemaker   4. Hypertensive heart disease with heart failure ([place])    PLAN:    In order of problems listed above:  1. Stable rate is controlled with AV ablation and pacemaker some fashion she is not set up for pacemaker follow-up in normal range statin she will continue her anticoagulant 2. Stable she prefers to continue warfarin 3. Stable function I reviewed her recent device check with her 4. Stable blood pressure target continue current treatment heart failure compensated continue current diuretics recent labs reviewed from October of warfarin is managed on laboratories checked with her primary care physician   Next appointment: 6 months   Medication Adjustments/Labs and Tests Ordered: Current medicines are reviewed at length with the patient today.  Concerns regarding medicines are outlined above.  No orders of the defined types were placed in this encounter.  No orders of the defined types were placed in this encounter.   Chief Complaint  Patient presents with  . Atrial Fibrillation    with a pacemaker  . Congestive Heart Failure  . Hypertension  . Anticoagulation    History of Present Illness:    [name] is a 78 y.o. female with a hx of CHF, Chronic Atrial Fibrillation with AV nodal ablation on warfarin, HTN, and a pacemaker last seen 08/02/17.  Compliance with diet, lifestyle and medications: Yes  Her last device check was 11/26/17 with normal function and device parameters battery 8.5 yrs expected. Recently has had stasis changes worsened in her RLE.  She also had cellulitis and received a course of antibiotics  She has no fever or chills still has some pain in  the right calf where she walked into the edge of furniture.  Weight is stable no shortness of breath chest pain palpitation or syncope continues on warfarin does not want to switch to a direct anticoagulant  Past Medical History:  Diagnosis Date  . Arthritis    SPURS, DDD, NECK  . Diabetes mellitus without complication [facility])    dx  2008 ?  Marland Kitchen Dysrhythmia    afib ([facility], Dr. Bettina Gavia)  . Edema    legs , left leg weeping, is wrapped 10/28/13  . GERD (gastroesophageal reflux disease)   . H/O hiatal hernia   . Headache    h/o 'muscular vascular' headaches  . Hypertension   . Hypothyroidism   . Mucous colitis   . Pacemaker   . Phlebitis    hx of  . Pulmonary hypertension ([place])   . Shortness of breath    due to pulmonary hypeertension  . Stroke [facility])    1998    Past Surgical History:  Procedure Laterality Date  . [place] VITRECTOMY WITH 20 GAUGE MVR PORT FOR MACULAR HOLE Left 11/12/2012   Procedure: 25 GAUGE PARS PLANA VITRECTOMY WITH 20 GAUGE MVR PORT FOR MACULAR HOLE;  Surgeon: Hayden Pedro, MD;  Location: [place];  Service: Ophthalmology;  Laterality: Left;  . ABDOMINAL HYSTERECTOMY  1977  . CATARACT EXTRACTION W/PHACO Left 10/29/2013   Procedure: LEFT CATARACT EXTRACTION PHACO AND INTRAOCULAR LENS PLACEMENT (IOC);  Surgeon: [name], MD;  Location: [place];  Service: Ophthalmology;  Laterality: Left;  . CHOLECYSTECTOMY  1983  . EYE SURGERY    . GAS INSERTION Left 11/12/2012   Procedure: INSERTION OF GAS;  Surgeon: Hayden Pedro, MD;  Location: [place];  Service: Ophthalmology;  Laterality: Left;  C3F8 (expires 11/2014)  . INSERT / REPLACE / REMOVE PACEMAKER     2008, DR BRIAN MUNLEY/Dr. Minna Merritts  . KNEE ARTHROSCOPY     1990S  RT   . MEMBRANE PEEL Left 11/12/2012   Procedure: MEMBRANE PEEL;  Surgeon: Hayden Pedro, MD;  Location: [place];  Service: Ophthalmology;  Laterality: Left;  . OSTECTOMY Right 09/04/2016   Procedure: OSTECTOMY, COMPLETE METATARSAL  HEAD FIFTH RIGHT;  Surgeon: Landis Martins, DPM;  Location: [place];  Service: Podiatry;  Laterality: Right;  . PARS PLANA VITRECTOMY Left 11/12/2012   with macular hole    Dr Zigmund Daniel  . PHOTOCOAGULATION WITH LASER Left 11/12/2012   Procedure: HEADSCOPE LASER;  Surgeon: Hayden Pedro, MD;  Location: [place];  Service: Ophthalmology;  Laterality: Left;  . SERUM PATCH Left 11/12/2012   Procedure: SERUM PATCH;  Surgeon: Hayden Pedro, MD;  Location: [place];  Service: Ophthalmology;  Laterality: Left;  . TONSILLECTOMY      Current Medications: Current Meds  Medication Sig  . acetaminophen (TYLENOL) 500 MG tablet Take 1,000 mg by mouth at bedtime as needed (knee pain).   Marland Kitchen ALPRAZolam (XANAX) 0.25 MG tablet Take 0.25 mg by mouth at bedtime.  Marland Kitchen amLODipine (NORVASC) 5 MG tablet Take 5 mg by mouth daily.  Marland Kitchen aspirin EC 81 MG tablet Take 81 mg by mouth daily.  . benazepril (LOTENSIN) 40 MG tablet Take 40 mg by mouth daily.  . Cholecalciferol (VITAMIN D) 2000 UNITS CAPS Take 2,000 Units by mouth daily.  Marland Kitchen estradiol (ESTRACE) 0.5 MG tablet Take 0.5 mg by mouth daily.  . furosemide (LASIX) 40 MG tablet Take 40 mg by mouth daily. May take additional dose if needed  . gabapentin (NEURONTIN) 800 MG tablet Take 800 mg by mouth at bedtime.   Marland Kitchen glipiZIDE (GLUCOTROL XL) 5 MG 24 hr tablet Take 5 mg by mouth daily with breakfast.   . hydrochlorothiazide (MICROZIDE) 12.5 MG capsule Take 12.5 mg by mouth at bedtime.   . Lactobacillus (ACIDOPHILUS) CAPS capsule Take 1 capsule by mouth daily.  Marland Kitchen levothyroxine (SYNTHROID, LEVOTHROID) 112 MCG tablet Take 112 mcg by mouth daily before breakfast.   . lovastatin (MEVACOR) 40 MG tablet Take 40 mg by mouth 2 (two) times daily.  . metFORMIN (GLUCOPHAGE) 500 MG tablet Take 500 mg by mouth daily with breakfast.  . metoprolol succinate (TOPROL-XL) 50 MG 24 hr tablet Take 50 mg by mouth daily. Take with or immediately following a meal.  . omeprazole (PRILOSEC) 20 MG capsule  Take 20 mg by mouth daily.  . potassium chloride (MICRO-K) 10 MEQ CR capsule Take 10 mEq by mouth 2 (two) times daily.   Marland Kitchen tiZANidine (ZANAFLEX) 4 MG tablet Take 2 mg by mouth at bedtime.  Marland Kitchen warfarin (COUMADIN) 3 MG tablet Take 3-6 mg by mouth daily at 6 PM. Take 2 tablets (6 mg) by mouth on Saturday and Tuesday, and take 1 tablet (3 mg) on Sunday, Monday, Wednesday, Thursday, and Friday     Allergies:   Patient has no known allergies.   Social History   Socioeconomic History  . Marital status: Widowed    Spouse name: Not on file  . Number of children: Not on file  .  Years of education: Not on file  . Highest education level: Not on file  Occupational History  . Not on file  Social Needs  . Financial resource strain: Not on file  . Food insecurity:    Worry: Not on file    Inability: Not on file  . Transportation needs:    Medical: Not on file    Non-medical: Not on file  Tobacco Use  . Smoking status: Never Smoker  . Smokeless tobacco: Never Used  Substance and Sexual Activity  . Alcohol use: No  . Drug use: No  . Sexual activity: Not on file  Lifestyle  . Physical activity:    Days per week: Not on file    Minutes per session: Not on file  . Stress: Not on file  Relationships  . Social connections:    Talks on phone: Not on file    Gets together: Not on file    Attends religious service: Not on file    Active member of club or organization: Not on file    Attends meetings of clubs or organizations: Not on file    Relationship status: Not on file  Other Topics Concern  . Not on file  Social History Narrative  . Not on file     Family History: The patient's family history includes Heart attack in her father; Hypertension in her father. ROS:   Please see the history of present illness.    All other systems reviewed and are negative.  EKGs/Labs/Other Studies Reviewed:    The following studies were reviewed today:    Recent Labs:   11/01/17  Chol 147 HDL  61 A1c 6.4% Cr 1.02 TSH 6.05 No results found for requested labs within last 8760 hours.  Recent Lipid Panel No results found for: CHOL, TRIG, HDL, CHOLHDL, VLDL, LDLCALC, LDLDIRECT  Physical Exam:    VS:  BP (!) 104/56 (BP Location: Left Arm, Patient Position: Sitting, Cuff Size: Large)   Pulse 64   Ht 5\' 2"  (1.575 m)   Wt 219 lb 3.2 oz (99.4 kg)   SpO2 97%   BMI 40.09 kg/m     Wt Readings from Last 3 Encounters:  01/22/18 219 lb 3.2 oz (99.4 kg)  11/26/17 231 lb (104.8 kg)  08/02/17 220 lb (99.8 kg)     GEN:  Well nourished, well developed in no acute distress HEENT: Normal NECK: No JVD; No carotid bruits LYMPHATICS: No lymphadenopathy CARDIAC: RRR, variable s1 no murmurs, rubs, gallops RESPIRATORY:  Clear to auscultation without rales, wheezing or rhonchi  ABDOMEN: Soft, non-tender, non-distended MUSCULOSKELETAL:  No edema but staisis changes; No deformity  SKIN: Warm and dry NEUROLOGIC:  Alert and oriented x 3 PSYCHIATRIC:  Normal affect    Signed, Shirlee More, MD  01/22/2018 8:29 AM    [facility]

## 2018-01-22 ENCOUNTER — Ambulatory Visit: Payer: Medicare HMO | Admitting: Cardiology

## 2018-01-22 ENCOUNTER — Encounter: Payer: Self-pay | Admitting: Cardiology

## 2018-01-22 VITALS — BP 104/56 | HR 64 | Ht 62.0 in | Wt 219.2 lb

## 2018-01-22 DIAGNOSIS — I110 Hypertensive heart disease with heart failure: Secondary | ICD-10-CM | POA: Diagnosis not present

## 2018-01-22 DIAGNOSIS — Z950 Presence of cardiac pacemaker: Secondary | ICD-10-CM

## 2018-01-22 DIAGNOSIS — I482 Chronic atrial fibrillation, unspecified: Secondary | ICD-10-CM | POA: Diagnosis not present

## 2018-01-22 DIAGNOSIS — Z7901 Long term (current) use of anticoagulants: Secondary | ICD-10-CM | POA: Diagnosis not present

## 2018-01-22 NOTE — Patient Instructions (Addendum)
Medication Instructions:  Your physician recommends that you continue on your current medications as directed. Please refer to the Current Medication list given to you today.  If you need a refill on your cardiac medications before your next appointment, please call your pharmacy.   Lab work: None  If you have labs (blood work) drawn today and your tests are completely normal, you will receive your results only by: Marland Kitchen MyChart Message (if you have MyChart) OR . A paper copy in the mail If you have any lab test that is abnormal or we need to change your treatment, we will call you to review the results.  Testing/Procedures: None  Follow-Up: At [facility], you and your health needs are our priority.  As part of our continuing mission to provide you with exceptional heart care, we have created designated Provider Care Teams.  These Care Teams include your primary Cardiologist (physician) and Advanced Practice Providers (APPs -  Physician Assistants and Nurse Practitioners) who all work together to provide you with the care you need, when you need it. You will need a follow up appointment in 6 months.  Please call our office 2 months in advance to schedule this appointment.          Stasis Dermatitis Stasis dermatitis is a long-term (chronic) skin condition that happens when veins can no longer pump blood back to the heart (poor circulation). This condition causes a red or brown scaly rash or sores (ulcers) from the pooling of blood (stasis). This condition usually affects the lower legs. It may affect one leg or both legs. Without treatment, severe stasis dermatitis can lead to other skin conditions and infections. What are the causes? This condition is caused by poor circulation. What increases the risk? You are more likely to develop this condition if:  You are not very active.  You stand for long periods of time.  You have veins that have become enlarged and twisted (varicose  veins).  You have leg veins that are not strong enough to send blood back to the heart (venous insufficiency).  You have had a blood clot.  You have been pregnant many times.  You have had vein surgery.  You are obese.  You have heart or kidney failure.  You are 78 years of age or older.  You have had injuries to your legs in the past. What are the signs or symptoms? Common early symptoms of this condition include:  Itchiness in one or both of your legs.  Swelling in your ankle or leg. This might get better overnight but be worse again during the day.  Skin that looks thin on your ankle and leg.  Red or brown marks that develop slowly.  Skin that is dry, cracked, or easily irritated.  Red, swollen skin that is sore or has a burning feeling.  An achy or heavy feeling after you walk or stand for long periods of time.  Pain. Later and more severe symptoms of this condition include:  Skin that looks shiny.  Small, open sores (ulcers). These are often red or purple and leak fluid.  Skin that feels hard.  Severe itching.  A change in the shape or color of your lower legs.  Severe pain.  Difficulty walking. How is this diagnosed? This condition may be diagnosed based on:  Your symptoms and medical history.  A physical exam. You may also have tests, including:  Blood tests.  Imaging tests to check blood flow (Doppler ultrasound).  Allergy tests. You may need to see a health care provider who specializes in skin diseases (dermatologist). How is this treated? This condition may be treated with:  Compression stockings or an elastic wrap to improve circulation.  Medicines, such as: ? Corticosteroid creams and ointments. ? Non-corticosteroid medicines applied to the skin (topical). ? Medicine to reduce swelling in the legs (diuretics). ? Antibiotics. ? Medicine to relieve itching (antihistamines).  A bandage (dressing).  A wrap that contains zinc and  gelatin (Unna boot). Follow these instructions at home: Skin care  Moisturize your skin as told by your health care provider. Do not use moisturizers with fragrance. This can irritate your skin.  Apply a cool, wet cloth (cool compress) to the affected areas.  Do not scratch your skin.  Do not rub your skin dry after a bath or shower. Gently pat your skin dry.  Do not use scented soaps, detergents, or perfumes. Medicines  Take or use over-the-counter and prescription medicines only as told by your health care provider.  If you were prescribed an antibiotic medicine, take or use it as told by your health care provider. Do not stop taking or using the antibiotic even if your condition improves. Activity  Walk as told by your health care provider. Walking increases blood flow.  Do calf and ankle exercises throughout the day as told by your health care provider. This will help increase blood flow.  Raise (elevate) your legs above the level of your heart when you are sitting or lying down. Lifestyle  Work with your health care provider to lose weight, if needed.  Do not cross your legs when you sit.  Do not stand or sit in one position for long periods of time.  Wear comfortable, loose-fitting clothing. Circulation in your legs will be worse if you wear tight pants, belts, and waistbands.  Do not use any products that contain nicotine or tobacco, such as cigarettes, e-cigarettes, and chewing tobacco. If you need help quitting, ask your health care provider. General instructions  If you were asked to use one of the following to help with your condition, follow instructions from your health care provider on how to: ? Remove and change any dressing. ? Wear compression stockings. These stockings help to prevent blood clots and reduce swelling in your legs. ? Wear the [employer].  Keep all follow-up visits as told by your health care provider. This is important. Contact a health care  provider if:  Your condition does not improve with treatment.  Your condition gets worse.  You have signs of infection in the affected area. Watch for: ? Swelling. ? Tenderness. ? Redness. ? Soreness. ? Warmth.  You have a fever. Get help right away if:  You notice red streaks coming from the affected area.  Your bone or joint underneath the affected area becomes painful after the skin has healed.  The affected area turns darker.  You feel a deep pain in your leg or groin.  You are short of breath. Summary  Stasis dermatitis is a long-term (chronic) skin condition that happens when veins can no longer pump blood back to the heart (poor circulation).  Wear compression stockings as told by your health care provider. These stockings help to prevent blood clots and reduce swelling in your legs.  Follow instructions from your health care provider about activity, medicines, and lifestyle.  Contact a health care provider if you have a fever or have signs of infection in the affected area.  Keep all follow-up visits as told by your health care provider. This is important. This information is not intended to replace advice given to you by your health care provider. Make sure you discuss any questions you have with your health care provider. Document Released: 04/13/2005 Document Revised: 06/04/2017 Document Reviewed: 06/04/2017 Elsevier Interactive Patient Education  2019 [employer].

## 2018-01-28 DIAGNOSIS — Z7901 Long term (current) use of anticoagulants: Secondary | ICD-10-CM | POA: Diagnosis not present

## 2018-01-28 DIAGNOSIS — H02831 Dermatochalasis of right upper eyelid: Secondary | ICD-10-CM | POA: Diagnosis not present

## 2018-01-28 DIAGNOSIS — H02834 Dermatochalasis of left upper eyelid: Secondary | ICD-10-CM | POA: Diagnosis not present

## 2018-01-28 DIAGNOSIS — E119 Type 2 diabetes mellitus without complications: Secondary | ICD-10-CM | POA: Diagnosis not present

## 2018-02-13 DIAGNOSIS — E1169 Type 2 diabetes mellitus with other specified complication: Secondary | ICD-10-CM | POA: Diagnosis not present

## 2018-02-13 DIAGNOSIS — E785 Hyperlipidemia, unspecified: Secondary | ICD-10-CM | POA: Diagnosis not present

## 2018-02-13 DIAGNOSIS — Z79899 Other long term (current) drug therapy: Secondary | ICD-10-CM | POA: Diagnosis not present

## 2018-02-13 DIAGNOSIS — Z7901 Long term (current) use of anticoagulants: Secondary | ICD-10-CM | POA: Diagnosis not present

## 2018-02-14 DIAGNOSIS — I83813 Varicose veins of bilateral lower extremities with pain: Secondary | ICD-10-CM | POA: Diagnosis not present

## 2018-02-14 DIAGNOSIS — R600 Localized edema: Secondary | ICD-10-CM | POA: Diagnosis not present

## 2018-02-14 DIAGNOSIS — I872 Venous insufficiency (chronic) (peripheral): Secondary | ICD-10-CM | POA: Diagnosis not present

## 2018-02-19 DIAGNOSIS — I4891 Unspecified atrial fibrillation: Secondary | ICD-10-CM | POA: Diagnosis not present

## 2018-02-19 DIAGNOSIS — E119 Type 2 diabetes mellitus without complications: Secondary | ICD-10-CM | POA: Diagnosis not present

## 2018-02-19 DIAGNOSIS — I890 Lymphedema, not elsewhere classified: Secondary | ICD-10-CM | POA: Diagnosis not present

## 2018-02-19 DIAGNOSIS — I872 Venous insufficiency (chronic) (peripheral): Secondary | ICD-10-CM | POA: Diagnosis not present

## 2018-02-20 DIAGNOSIS — Z6841 Body Mass Index (BMI) 40.0 and over, adult: Secondary | ICD-10-CM | POA: Diagnosis not present

## 2018-02-20 DIAGNOSIS — I890 Lymphedema, not elsewhere classified: Secondary | ICD-10-CM | POA: Diagnosis not present

## 2018-02-20 DIAGNOSIS — E039 Hypothyroidism, unspecified: Secondary | ICD-10-CM | POA: Diagnosis not present

## 2018-02-20 DIAGNOSIS — I83813 Varicose veins of bilateral lower extremities with pain: Secondary | ICD-10-CM | POA: Diagnosis not present

## 2018-02-20 DIAGNOSIS — E785 Hyperlipidemia, unspecified: Secondary | ICD-10-CM | POA: Diagnosis not present

## 2018-02-20 DIAGNOSIS — E119 Type 2 diabetes mellitus without complications: Secondary | ICD-10-CM | POA: Diagnosis not present

## 2018-02-20 DIAGNOSIS — E1169 Type 2 diabetes mellitus with other specified complication: Secondary | ICD-10-CM | POA: Diagnosis not present

## 2018-02-20 DIAGNOSIS — I4891 Unspecified atrial fibrillation: Secondary | ICD-10-CM | POA: Diagnosis not present

## 2018-02-20 DIAGNOSIS — I10 Essential (primary) hypertension: Secondary | ICD-10-CM | POA: Diagnosis not present

## 2018-02-20 DIAGNOSIS — K219 Gastro-esophageal reflux disease without esophagitis: Secondary | ICD-10-CM | POA: Diagnosis not present

## 2018-02-20 DIAGNOSIS — I480 Paroxysmal atrial fibrillation: Secondary | ICD-10-CM | POA: Diagnosis not present

## 2018-02-20 DIAGNOSIS — I872 Venous insufficiency (chronic) (peripheral): Secondary | ICD-10-CM | POA: Diagnosis not present

## 2018-02-25 ENCOUNTER — Ambulatory Visit (INDEPENDENT_AMBULATORY_CARE_PROVIDER_SITE_OTHER): Payer: Medicare HMO

## 2018-02-25 DIAGNOSIS — I110 Hypertensive heart disease with heart failure: Secondary | ICD-10-CM

## 2018-02-25 DIAGNOSIS — I482 Chronic atrial fibrillation, unspecified: Secondary | ICD-10-CM | POA: Diagnosis not present

## 2018-02-26 LAB — CUP PACEART REMOTE DEVICE CHECK
Battery Remaining Longevity: 99 mo
Battery Voltage: 2.8 V
Brady Statistic RV Percent Paced: 99 %
Date Time Interrogation Session: 20200210172225
Implantable Lead Implant Date: 20080520
Implantable Lead Location: 753860
Implantable Lead Model: 4076
Lead Channel Pacing Threshold Pulse Width: 0.4 ms
Lead Channel Setting Pacing Amplitude: 2.5 V
Lead Channel Setting Pacing Pulse Width: 0.76 ms
MDC IDC LEAD IMPLANT DT: 20080520
MDC IDC LEAD LOCATION: 753859
MDC IDC MSMT BATTERY IMPEDANCE: 253 Ohm
MDC IDC MSMT LEADCHNL RA IMPEDANCE VALUE: 67 Ohm
MDC IDC MSMT LEADCHNL RV IMPEDANCE VALUE: 521 Ohm
MDC IDC MSMT LEADCHNL RV PACING THRESHOLD AMPLITUDE: 1.25 V
MDC IDC PG IMPLANT DT: 20161027
MDC IDC SET LEADCHNL RV SENSING SENSITIVITY: 1 mV

## 2018-02-27 DIAGNOSIS — I890 Lymphedema, not elsewhere classified: Secondary | ICD-10-CM | POA: Diagnosis not present

## 2018-02-27 DIAGNOSIS — I872 Venous insufficiency (chronic) (peripheral): Secondary | ICD-10-CM | POA: Diagnosis not present

## 2018-02-27 DIAGNOSIS — E119 Type 2 diabetes mellitus without complications: Secondary | ICD-10-CM | POA: Diagnosis not present

## 2018-02-27 DIAGNOSIS — I4891 Unspecified atrial fibrillation: Secondary | ICD-10-CM | POA: Diagnosis not present

## 2018-03-06 DIAGNOSIS — I4891 Unspecified atrial fibrillation: Secondary | ICD-10-CM | POA: Diagnosis not present

## 2018-03-06 DIAGNOSIS — I890 Lymphedema, not elsewhere classified: Secondary | ICD-10-CM | POA: Diagnosis not present

## 2018-03-06 DIAGNOSIS — I872 Venous insufficiency (chronic) (peripheral): Secondary | ICD-10-CM | POA: Diagnosis not present

## 2018-03-06 DIAGNOSIS — E119 Type 2 diabetes mellitus without complications: Secondary | ICD-10-CM | POA: Diagnosis not present

## 2018-03-08 NOTE — Progress Notes (Signed)
Remote pacemaker transmission.   

## 2018-03-18 DIAGNOSIS — I480 Paroxysmal atrial fibrillation: Secondary | ICD-10-CM | POA: Diagnosis not present

## 2018-03-18 DIAGNOSIS — I872 Venous insufficiency (chronic) (peripheral): Secondary | ICD-10-CM | POA: Diagnosis not present

## 2018-03-18 DIAGNOSIS — Z79899 Other long term (current) drug therapy: Secondary | ICD-10-CM | POA: Diagnosis not present

## 2018-03-18 DIAGNOSIS — Z7901 Long term (current) use of anticoagulants: Secondary | ICD-10-CM | POA: Diagnosis not present

## 2018-03-18 DIAGNOSIS — M79604 Pain in right leg: Secondary | ICD-10-CM | POA: Diagnosis not present

## 2018-03-18 DIAGNOSIS — R42 Dizziness and giddiness: Secondary | ICD-10-CM | POA: Diagnosis not present

## 2018-03-18 DIAGNOSIS — Z6841 Body Mass Index (BMI) 40.0 and over, adult: Secondary | ICD-10-CM | POA: Diagnosis not present

## 2018-03-18 DIAGNOSIS — I509 Heart failure, unspecified: Secondary | ICD-10-CM | POA: Diagnosis not present

## 2018-03-25 DIAGNOSIS — E790 Hyperuricemia without signs of inflammatory arthritis and tophaceous disease: Secondary | ICD-10-CM | POA: Diagnosis not present

## 2018-03-25 DIAGNOSIS — I480 Paroxysmal atrial fibrillation: Secondary | ICD-10-CM | POA: Diagnosis not present

## 2018-03-25 DIAGNOSIS — Z6841 Body Mass Index (BMI) 40.0 and over, adult: Secondary | ICD-10-CM | POA: Diagnosis not present

## 2018-03-25 DIAGNOSIS — M79604 Pain in right leg: Secondary | ICD-10-CM | POA: Diagnosis not present

## 2018-03-25 DIAGNOSIS — I10 Essential (primary) hypertension: Secondary | ICD-10-CM | POA: Diagnosis not present

## 2018-04-08 DIAGNOSIS — Z6841 Body Mass Index (BMI) 40.0 and over, adult: Secondary | ICD-10-CM | POA: Diagnosis not present

## 2018-04-08 DIAGNOSIS — I872 Venous insufficiency (chronic) (peripheral): Secondary | ICD-10-CM | POA: Diagnosis not present

## 2018-04-08 DIAGNOSIS — J302 Other seasonal allergic rhinitis: Secondary | ICD-10-CM | POA: Diagnosis not present

## 2018-04-08 DIAGNOSIS — I10 Essential (primary) hypertension: Secondary | ICD-10-CM | POA: Diagnosis not present

## 2018-04-08 DIAGNOSIS — E790 Hyperuricemia without signs of inflammatory arthritis and tophaceous disease: Secondary | ICD-10-CM | POA: Diagnosis not present

## 2018-04-08 DIAGNOSIS — E1169 Type 2 diabetes mellitus with other specified complication: Secondary | ICD-10-CM | POA: Diagnosis not present

## 2018-04-11 ENCOUNTER — Encounter: Payer: Self-pay | Admitting: Vascular Surgery

## 2018-04-11 ENCOUNTER — Ambulatory Visit (HOSPITAL_COMMUNITY)
Admission: RE | Admit: 2018-04-11 | Discharge: 2018-04-11 | Disposition: A | Discharge status: Home / Self Care | Payer: Medicare HMO | Source: Ambulatory Visit | Attending: Family | Admitting: Family

## 2018-04-11 ENCOUNTER — Other Ambulatory Visit: Payer: Self-pay

## 2018-04-11 ENCOUNTER — Encounter: Payer: Medicare HMO | Admitting: Vascular Surgery

## 2018-04-11 ENCOUNTER — Ambulatory Visit: Payer: Medicare HMO | Admitting: Vascular Surgery

## 2018-04-11 ENCOUNTER — Encounter (HOSPITAL_COMMUNITY): Payer: Medicare HMO

## 2018-04-11 VITALS — BP 139/69 | HR 66 | Temp 97.8°F | Resp 20 | Ht 62.0 in | Wt 222.0 lb

## 2018-04-11 DIAGNOSIS — I83811 Varicose veins of right lower extremities with pain: Secondary | ICD-10-CM

## 2018-04-11 DIAGNOSIS — I8390 Asymptomatic varicose veins of unspecified lower extremity: Secondary | ICD-10-CM

## 2018-04-11 NOTE — Progress Notes (Signed)
Patient name: [name] MRN: [id number] DOB: 1940-07-01 Sex: female  HPI: [name] is a 78 y.o. female, with a longstanding history of lower extremity swelling.  She has been seen by several physicians in the past and undergone compression therapy.  Review of her chart shows problems with left leg swelling in the past and problems with leg swelling in general dating back to at least 2015.  She is concerned that she has had darkish discoloration of the skin on her right leg.  She has had multiple episodes of her right leg phlebitis in the past.  Her most recent episode of problems in the right leg was treated by Dr. Amalia Hailey in [place].  She complains primarily of her right leg which she states over the last year has swollen a lot.  She denies prior history of DVT.  She denies family history of varicose veins.  She states she has lost some weight about 30 pounds.  However she is not really on a weight loss program.  She is on aspirin.  She is also on warfarin.  Apparently is for atrial fibrillation.  Other medical problems include diabetes, hypertension, pulmonary hypertension, all of which have been stable.  She is compliant with wrapping her legs with Ace wraps from the foot to the knee daily.  She has been doing this for several years.  States this does help her symptoms.  Past Medical History:  Diagnosis Date  . Arthritis    SPURS, DDD, NECK  . Diabetes mellitus without complication [facility])    dx  2008 ?  Marland Kitchen Dysrhythmia    afib ([place], Dr. Bettina Gavia)  . Edema    legs , left leg weeping, is wrapped 10/28/13  . GERD (gastroesophageal reflux disease)   . H/O hiatal hernia   . Headache    h/o 'muscular vascular' headaches  . Hypertension   . Hypothyroidism   . Mucous colitis   . Pacemaker   . Phlebitis    hx of  . Pulmonary hypertension ([place])   . Shortness of breath    due to pulmonary hypeertension  . Stroke [facility])    1998   Past Surgical History:  Procedure Laterality  Date  . [place] VITRECTOMY WITH 20 GAUGE MVR PORT FOR MACULAR HOLE Left 11/12/2012   Procedure: 25 GAUGE PARS PLANA VITRECTOMY WITH 20 GAUGE MVR PORT FOR MACULAR HOLE;  Surgeon: Hayden Pedro, MD;  Location: [place];  Service: Ophthalmology;  Laterality: Left;  . ABDOMINAL HYSTERECTOMY  1977  . CATARACT EXTRACTION W/PHACO Left 10/29/2013   Procedure: LEFT CATARACT EXTRACTION PHACO AND INTRAOCULAR LENS PLACEMENT (IOC);  Surgeon: [name], MD;  Location: [place];  Service: Ophthalmology;  Laterality: Left;  . CHOLECYSTECTOMY  1983  . EYE SURGERY    . GAS INSERTION Left 11/12/2012   Procedure: INSERTION OF GAS;  Surgeon: Hayden Pedro, MD;  Location: [place];  Service: Ophthalmology;  Laterality: Left;  C3F8 (expires 11/2014)  . INSERT / REPLACE / REMOVE PACEMAKER     2008, DR BRIAN MUNLEY/Dr. Minna Merritts  . KNEE ARTHROSCOPY     1990S  RT   . MEMBRANE PEEL Left 11/12/2012   Procedure: MEMBRANE PEEL;  Surgeon: Hayden Pedro, MD;  Location: [place];  Service: Ophthalmology;  Laterality: Left;  . OSTECTOMY Right 09/04/2016   Procedure: OSTECTOMY, COMPLETE METATARSAL HEAD FIFTH RIGHT;  Surgeon: Landis Martins, DPM;  Location: [place];  Service: Podiatry;  Laterality: Right;  .  PARS PLANA VITRECTOMY Left 11/12/2012   with macular hole    Dr Zigmund Daniel  . PHOTOCOAGULATION WITH LASER Left 11/12/2012   Procedure: HEADSCOPE LASER;  Surgeon: Hayden Pedro, MD;  Location: [place];  Service: Ophthalmology;  Laterality: Left;  . SERUM PATCH Left 11/12/2012   Procedure: SERUM PATCH;  Surgeon: Hayden Pedro, MD;  Location: [place];  Service: Ophthalmology;  Laterality: Left;  . TONSILLECTOMY      Family History  Problem Relation Age of Onset  . Heart attack Father   . Hypertension Father     SOCIAL HISTORY: Social History   Socioeconomic History  . Marital status: Widowed    Spouse name: Not on file  . Number of children: Not on file  . Years of education: Not on file  . Highest education  level: Not on file  Occupational History  . Not on file  Social Needs  . Financial resource strain: Not on file  . Food insecurity:    Worry: Not on file    Inability: Not on file  . Transportation needs:    Medical: Not on file    Non-medical: Not on file  Tobacco Use  . Smoking status: Never Smoker  . Smokeless tobacco: Never Used  Substance and Sexual Activity  . Alcohol use: No  . Drug use: No  . Sexual activity: Not on file  Lifestyle  . Physical activity:    Days per week: Not on file    Minutes per session: Not on file  . Stress: Not on file  Relationships  . Social connections:    Talks on phone: Not on file    Gets together: Not on file    Attends religious service: Not on file    Active member of club or organization: Not on file    Attends meetings of clubs or organizations: Not on file    Relationship status: Not on file  . Intimate partner violence:    Fear of current or ex partner: Not on file    Emotionally abused: Not on file    Physically abused: Not on file    Forced sexual activity: Not on file  Other Topics Concern  . Not on file  Social History Narrative  . Not on file    No Known Allergies  Current Outpatient Medications  Medication Sig Dispense Refill  . acetaminophen (TYLENOL) 500 MG tablet Take 1,000 mg by mouth at bedtime as needed (knee pain).     Marland Kitchen ALPRAZolam (XANAX) 0.25 MG tablet Take 0.25 mg by mouth at bedtime.    Marland Kitchen aspirin EC 81 MG tablet Take 81 mg by mouth daily.    . benazepril (LOTENSIN) 40 MG tablet Take 40 mg by mouth daily.    . Cholecalciferol (VITAMIN D) 2000 UNITS CAPS Take 2,000 Units by mouth daily.    Marland Kitchen estradiol (ESTRACE) 0.5 MG tablet Take 0.5 mg by mouth daily.    . furosemide (LASIX) 40 MG tablet Take 40 mg by mouth daily. May take additional dose if needed    . gabapentin (NEURONTIN) 800 MG tablet Take 800 mg by mouth at bedtime.     Marland Kitchen glipiZIDE (GLUCOTROL XL) 5 MG 24 hr tablet Take 5 mg by mouth daily with  breakfast.     . Lactobacillus (ACIDOPHILUS) CAPS capsule Take 1 capsule by mouth daily.    Marland Kitchen levothyroxine (SYNTHROID, LEVOTHROID) 112 MCG tablet Take 112 mcg by mouth daily before breakfast.     .  lovastatin (MEVACOR) 40 MG tablet Take 40 mg by mouth 2 (two) times daily.    . metFORMIN (GLUCOPHAGE) 500 MG tablet Take 500 mg by mouth daily with breakfast.    . metoprolol succinate (TOPROL-XL) 50 MG 24 hr tablet Take 50 mg by mouth daily. Take with or immediately following a meal.    . omeprazole (PRILOSEC) 20 MG capsule Take 20 mg by mouth daily.    . potassium chloride (MICRO-K) 10 MEQ CR capsule Take 10 mEq by mouth 2 (two) times daily.     Marland Kitchen tiZANidine (ZANAFLEX) 4 MG tablet Take 2 mg by mouth at bedtime.    Marland Kitchen warfarin (COUMADIN) 3 MG tablet Take 3-6 mg by mouth daily at 6 PM. Take 2 tablets (6 mg) by mouth on Saturday and Tuesday, and take 1 tablet (3 mg) on Sunday, Monday, Wednesday, Thursday, and Friday    . allopurinol (ZYLOPRIM) 100 MG tablet Take 100 mg by mouth daily.     No current facility-administered medications for this visit.     ROS:   General:  + weight loss, no Fever, chills  HEENT: No recent headaches, no nasal bleeding, no visual changes, no sore throat  Neurologic: No dizziness, blackouts, seizures. No recent symptoms of stroke or mini- stroke. No recent episodes of slurred speech, or temporary blindness.  Cardiac: No recent episodes of chest pain/pressure, no shortness of breath at rest.  + shortness of breath with exertion.  Denies history of atrial fibrillation or irregular heartbeat  Vascular: No history of rest pain in feet.  No history of claudication.  +history of non-healing ulcer, No history of DVT   Pulmonary: No home oxygen, no productive cough, no hemoptysis,  No asthma or wheezing  Musculoskeletal:  [X]  Arthritis, [ ]  Low back pain,  [X]  Joint pain  Hematologic:No history of hypercoagulable state.  No history of easy bleeding.  No history of anemia   Gastrointestinal: No hematochezia or melena,  No gastroesophageal reflux, no trouble swallowing  Urinary: [ ]  chronic Kidney disease, [ ]  on HD - [ ]  MWF or [ ]  TTHS, [ ]  Burning with urination, [ ]  Frequent urination, [ ]  Difficulty urinating;   Skin: No rashes  Psychological: No history of anxiety,  No history of depression   Physical Examination  Vitals:   04/11/18 1015  BP: 139/69  Pulse: 66  Resp: 20  Temp: 97.8 F (36.6 C)  SpO2: 95%  Weight: 222 lb (100.7 kg)  Height: 5\' 2"  (1.575 m)    Body mass index is 40.6 kg/m.  General:  Alert and oriented, no acute distress HEENT: Normal Neck: No bruit or JVD Pulmonary: Clear to auscultation bilaterally Cardiac: Regular Rate and Rhythm without murmur Abdomen: Soft, non-tender, non-distended, no mass, obese Skin: No rash, considering staining right medial gaiter area right leg Extremity Pulses:  2+ radial, brachial, femoral, dorsalis pedis, posterior tibial pulses bilaterally Musculoskeletal: No deformity is pretibial bilateral edema  Neurologic: Upper and lower extremity motor 5/5 and symmetric  DATA:  Patient had a venous reflux exam which showed a 5 mm right greater saphenous vein with reflux at the saphenofemoral junction.  I repeated portions of her duplex exam with the Sono[employer] today and confirmed this.  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  Had lengthy discussion with patient today regarding venous pathophysiology.  I did discuss with her that she had good arterial circulation and is not at risk  of limb loss.  She asked if any intervention would help with the intermittent aching and pain of her knee and calf.  I am not sure if a laser ablation of her right greater saphenous vein would alleviate all the symptoms.  Some of them sound like neuropathy.  Certainly I did explain to her that it would decrease risk of recurrent  ulceration in her right leg and decrease with swelling.  I did also explain to her that she can continue with compression therapy and this may alleviate her symptoms just as well.  She currently wishes to think about whether or not she wishes a venous intervention.   PLAN: We will follow-up in 3 months time.  She will continue her compression with her Ace wraps.  When she returns in 3 months she will think about whether or not she wishes to proceed with a venous intervention.   Ruta Hinds, MD [facility] Office: [phone] Pager: [phone]

## 2018-04-19 DIAGNOSIS — E790 Hyperuricemia without signs of inflammatory arthritis and tophaceous disease: Secondary | ICD-10-CM | POA: Diagnosis not present

## 2018-04-19 DIAGNOSIS — Z79899 Other long term (current) drug therapy: Secondary | ICD-10-CM | POA: Diagnosis not present

## 2018-04-19 DIAGNOSIS — Z7901 Long term (current) use of anticoagulants: Secondary | ICD-10-CM | POA: Diagnosis not present

## 2018-04-23 DIAGNOSIS — Z7901 Long term (current) use of anticoagulants: Secondary | ICD-10-CM | POA: Diagnosis not present

## 2018-05-13 DIAGNOSIS — Z7901 Long term (current) use of anticoagulants: Secondary | ICD-10-CM | POA: Diagnosis not present

## 2018-05-13 DIAGNOSIS — M79604 Pain in right leg: Secondary | ICD-10-CM | POA: Diagnosis not present

## 2018-05-13 DIAGNOSIS — R42 Dizziness and giddiness: Secondary | ICD-10-CM | POA: Diagnosis not present

## 2018-05-13 DIAGNOSIS — Z79899 Other long term (current) drug therapy: Secondary | ICD-10-CM | POA: Diagnosis not present

## 2018-05-13 DIAGNOSIS — I480 Paroxysmal atrial fibrillation: Secondary | ICD-10-CM | POA: Diagnosis not present

## 2018-05-16 DIAGNOSIS — R0609 Other forms of dyspnea: Secondary | ICD-10-CM | POA: Diagnosis not present

## 2018-05-16 DIAGNOSIS — R531 Weakness: Secondary | ICD-10-CM | POA: Diagnosis not present

## 2018-05-16 DIAGNOSIS — R069 Unspecified abnormalities of breathing: Secondary | ICD-10-CM | POA: Diagnosis not present

## 2018-05-16 DIAGNOSIS — I10 Essential (primary) hypertension: Secondary | ICD-10-CM | POA: Diagnosis not present

## 2018-05-16 DIAGNOSIS — I110 Hypertensive heart disease with heart failure: Secondary | ICD-10-CM | POA: Diagnosis not present

## 2018-05-16 DIAGNOSIS — I509 Heart failure, unspecified: Secondary | ICD-10-CM | POA: Diagnosis not present

## 2018-05-16 DIAGNOSIS — R0602 Shortness of breath: Secondary | ICD-10-CM | POA: Diagnosis not present

## 2018-05-17 ENCOUNTER — Telehealth: Payer: Self-pay | Admitting: Cardiology

## 2018-05-17 NOTE — Telephone Encounter (Signed)
Patient released last night from [facility] ER with CHF, active SOB, her blood pressure when she went in was 226/135 and she states is still high now. I set her up a virtual appt for Monday but she may need to be seen today. Dr. Bettina Gavia nurses asked that a triage note be put in so Dr. Raliegh Ip can examine records. Please advise.

## 2018-05-17 NOTE — Telephone Encounter (Signed)
I look at the record, I will give some time for meds to work, let's call her later today and ask how is she

## 2018-05-17 NOTE — Telephone Encounter (Signed)
Cardiac Questionnaire:    Since your last visit or hospitalization:    1. Have you been having new or worsening chest pain? no   2. Have you been having new or worsening shortness of breath?yes 3. Have you been having new or worsening leg swelling, wt gain, or increase in abdominal girth (pants fitting more tightly)? yes   4. Have you had any passing out spells? no    *A YES to any of these questions would result in the appointment being kept. *If all the answers to these questions are NO, we should indicate that given the current situation regarding the worldwide coronarvirus pandemic, at the recommendation of the CDC, we are looking to limit gatherings in our waiting area, and thus will reschedule their appointment beyond four weeks from today.   _____________   YJEHU-31 Pre-Screening Questions:   Do you currently have a fever? no  Have you recently travelled on a cruise, internationally, or to [place], [place], [place], [place], or [place], [place]) ? no  Have you been in contact with someone that is currently pending confirmation of Covid19 testing or has been confirmed to have the [place] virus?  no  Are you currently experiencing fatigue or cough? Very fatigued     Virtual Visit Pre-Appointment Phone Call  "(Name), I am calling you today to discuss your upcoming appointment. We are currently trying to limit exposure to the virus that causes COVID-19 by seeing patients at home rather than in the office."  1. "What is the BEST phone number to call the day of the visit?" - include this in appointment notes  2. Do you have or have access to (through a family member/friend) a smartphone with video capability that we can use for your visit?" a. If yes - list this number in appt notes as cell (if different from BEST phone #) and list the appointment type as a VIDEO visit in appointment notes b. If no - list the appointment type as a PHONE visit in appointment notes  3. Confirm  consent - "In the setting of the current Covid19 crisis, you are scheduled for a (phone or video) visit with your provider on (date) at (time).  Just as we do with many in-office visits, in order for you to participate in this visit, we must obtain consent.  If you'd like, I can send this to your mychart (if signed up) or email for you to review.  Otherwise, I can obtain your verbal consent now.  All virtual visits are billed to your insurance company just like a normal visit would be.  By agreeing to a virtual visit, we'd like you to understand that the technology does not allow for your provider to perform an examination, and thus may limit your provider's ability to fully assess your condition. If your provider identifies any concerns that need to be evaluated in person, we will make arrangements to do so.  Finally, though the technology is pretty good, we cannot assure that it will always work on either your or our end, and in the setting of a video visit, we may have to convert it to a phone-only visit.  In either situation, we cannot ensure that we have a secure connection.  Are you willing to proceed?" STAFF: Did the patient verbally acknowledge consent to telehealth visit? Document YES/NO here: Yes  4. Advise patient to be prepared - "Two hours prior to your appointment, go ahead and check your blood pressure, pulse, oxygen  saturation, and your weight (if you have the equipment to check those) and write them all down. When your visit starts, your provider will ask you for this information. If you have an Apple Watch or Kardia device, please plan to have heart rate information ready on the day of your appointment. Please have a pen and paper handy nearby the day of the visit as well."  5. Give patient instructions for MyChart download to smartphone OR Doximity/Doxy.me as below if video visit (depending on what platform provider is using)  6. Inform patient they will receive a phone call 15 minutes prior  to their appointment time (may be from unknown caller ID) so they should be prepared to answer    TELEPHONE CALL NOTE  [name] has been deemed a candidate for a follow-up tele-health visit to limit community exposure during the Covid-19 pandemic. I spoke with the patient via phone to ensure availability of phone/video source, confirm preferred email & phone number, and discuss instructions and expectations.  I reminded [name] to be prepared with any vital sign and/or heart rhythm information that could potentially be obtained via home monitoring, at the time of her visit. I reminded [name] to expect a phone call prior to her visit.  Calla Kicks 05/17/2018 8:10 AM   INSTRUCTIONS FOR DOWNLOADING THE MYCHART APP TO SMARTPHONE  - The patient must first make sure to have activated MyChart and know their login information - If Apple, go to [employer] and type in MyChart in the search bar and download the app. If Android, ask patient to go to [employer] and type in [place] in the search bar and download the app. The app is free but as with any other app downloads, their phone may require them to verify saved payment information or Apple/Android password.  - The patient will need to then log into the app with their MyChart username and password, and select [facility] as their healthcare provider to link the account. When it is time for your visit, go to the MyChart app, find appointments, and click Begin Video Visit. Be sure to Select Allow for your device to access the Microphone and Camera for your visit. You will then be connected, and your provider will be with you shortly.  **If they have any issues connecting, or need assistance please contact MyChart service desk [phone])**  **If using a computer, in order to ensure the best quality for their visit they will need to use either of the following Internet Browsers: [employer], or Google  Chrome**  IF USING DOXIMITY or DOXY.ME - The patient will receive a link just prior to their visit by text.     FULL LENGTH CONSENT FOR TELE-HEALTH VISIT   I hereby voluntarily request, consent and authorize [facility] and its employed or contracted physicians, physician assistants, nurse practitioners or other licensed health care professionals (the Practitioner), to provide me with telemedicine health care services (the Services") as deemed necessary by the treating Practitioner. I acknowledge and consent to receive the Services by the Practitioner via telemedicine. I understand that the telemedicine visit will involve communicating with the Practitioner through live audiovisual communication technology and the disclosure of certain medical information by electronic transmission. I acknowledge that I have been given the opportunity to request an in-person assessment or other available alternative prior to the telemedicine visit and am voluntarily participating in the telemedicine visit.  I understand that I have the  right to withhold or withdraw my consent to the use of telemedicine in the course of my care at any time, without affecting my right to future care or treatment, and that the Practitioner or I may terminate the telemedicine visit at any time. I understand that I have the right to inspect all information obtained and/or recorded in the course of the telemedicine visit and may receive copies of available information for a reasonable fee.  I understand that some of the potential risks of receiving the Services via telemedicine include:   Delay or interruption in medical evaluation due to technological equipment failure or disruption;  Information transmitted may not be sufficient (e.g. poor resolution of images) to allow for appropriate medical decision making by the Practitioner; and/or   In rare instances, security protocols could fail, causing a breach of personal health  information.  Furthermore, I acknowledge that it is my responsibility to provide information about my medical history, conditions and care that is complete and accurate to the best of my ability. I acknowledge that Practitioner's advice, recommendations, and/or decision may be based on factors not within their control, such as incomplete or inaccurate data provided by me or distortions of diagnostic images or specimens that may result from electronic transmissions. I understand that the practice of medicine is not an exact science and that Practitioner makes no warranties or guarantees regarding treatment outcomes. I acknowledge that I will receive a copy of this consent concurrently upon execution via email to the email address I last provided but may also request a printed copy by calling the office of [facility].    I understand that my insurance will be billed for this visit.   I have read or had this consent read to me.  I understand the contents of this consent, which adequately explains the benefits and risks of the Services being provided via telemedicine.   I have been provided ample opportunity to ask questions regarding this consent and the Services and have had my questions answered to my satisfaction.  I give my informed consent for the services to be provided through the use of telemedicine in my medical care  By participating in this telemedicine visit I agree to the above.

## 2018-05-17 NOTE — Telephone Encounter (Signed)
Pt called  In to say she went to [facility] with SOB and BP 226/135. States was given a "large dose of Lasix" and increased her home dose to 40mg  BID and sent home. Currently she states she is still SOB, weak and jittery. Denies CP and states slight swelling in her L leg. BP this Am is 110/65. Patient has been put on Mon schedule for Dr. Bettina Gavia. Will consult with DOD for further instructions.

## 2018-05-17 NOTE — Telephone Encounter (Signed)
Telephone call to patient. Pt states "feeling much better but not 100%". Reminded of virtual visit with Dr. Bettina Gavia on Monday and to call if she needed anything. States she will.

## 2018-05-17 NOTE — Telephone Encounter (Signed)
They didn't put pt on any new medication. Can't get a deep breath and BP 110/65 this am. Still can't breath good and feel drained. Can we see her today or should she go to her medical doctor?

## 2018-05-17 NOTE — Telephone Encounter (Signed)
Spoke with patient and informed her of Dr. Wendy Poet recommendations. Will call her later today and check on her.

## 2018-05-18 NOTE — Progress Notes (Signed)
Virtual Visit via Telephone Note   This visit type was conducted due to national recommendations for restrictions regarding the COVID-19 Pandemic (e.g. social distancing) in an effort to limit this patient's exposure and mitigate transmission in our community.  Due to her co-morbid illnesses, this patient is at least at moderate risk for complications without adequate follow up.  This format is felt to be most appropriate for this patient at this time.  The patient did not have access to video technology/had technical difficulties with video requiring transitioning to audio format only (telephone).  All issues noted in this document were discussed and addressed.  No physical exam could be performed with this format.  Please refer to the patient's chart for her  consent to telehealth for [facility]   Date:  05/20/2018   ID:  [name], DOB 28-Jun-1940, MRN [id number]  Patient Location: Home Provider Location: Home  PCP:  Ernestene Kiel, MD  Cardiologist:  No primary care provider on file. Dr Bettina Gavia Electrophysiologist:  Will Meredith Leeds, MD   Evaluation Performed:  Follow-Up Visit  Chief Complaint:  Heart failure  History of Present Illness:    [name] is a 78 y.o. female with a hx of CHF, Chronic Atrial Fibrillation with AV nodal ablation on warfarin, HTN, and a pacemaker last seen  Last seen 01/22/18.  The patient does not have symptoms concerning for COVID-19 infection (fever, chills, cough, or new shortness of breath).   Unfortunately she has no Internet or smart phone access.  She really takes good care of her self restrict sodium weighs every day and although her weight did not change she developed increasing edema orthopnea shortness of breath and went to [facility] emergency room with blood pressure elevated greater than 782 systolic her perception she was in rapid atrial fibrillation but was not in really the central issue with shortness of breath  she was in decompensated heart failure proBNP level was 2020 received IV Lasix blood pressure came and Shortness of breath improved and was discharged home.  Repeat EKG on admission shows ventricular paced rhythm underlying atrial fibrillation.  Her weight is dropped now from 2 21-2 27 down to 217 to 218 pounds.  She still has edema she has very vague mild breathlessness but no orthopnea and she certainly feels better.  As opposed to increasing the dose of furosemide Norco and then a transition her to a more dependable diuretic torsemide and tell her that a day she weighs 220 pounds or greater she will take an extra tablet.  I was going to have her come to the office for lab work which she told me she is going to see her PCP tomorrow and she needs to have a BMP and proBNP level repeated.  I am also the reach out and see if we can take advantage of a novel program we have found at no cost to the patient to come in and do check at home including telemetry strip and lung water determination to [facility] at home during the COVID-19 emergency. Past Medical History:  Diagnosis Date   Arthritis    SPURS, DDD, NECK   Diabetes mellitus without complication ([place])    dx  2008 ?   Dysrhythmia    afib ([place], Dr. Bettina Gavia)   Edema    legs , left leg weeping, is wrapped 10/28/13   GERD (gastroesophageal reflux disease)    H/O hiatal hernia    Headache    h/o 'muscular vascular'  headaches   Hypertension    Hypothyroidism    Mucous colitis    Pacemaker    Phlebitis    hx of   Pulmonary hypertension (HCC)    Shortness of breath    due to pulmonary hypeertension   Stroke [facility])    1998   Past Surgical History:  Procedure Laterality Date   25 GAUGE PARS PLANA VITRECTOMY WITH 20 GAUGE MVR PORT FOR MACULAR HOLE Left 11/12/2012   Procedure: 25 GAUGE PARS PLANA VITRECTOMY WITH 20 GAUGE MVR PORT FOR MACULAR HOLE;  Surgeon: Hayden Pedro, MD;  Location: [facility];  Service: Ophthalmology;   Laterality: Left;   ABDOMINAL HYSTERECTOMY  1977   CATARACT EXTRACTION W/PHACO Left 10/29/2013   Procedure: LEFT CATARACT EXTRACTION PHACO AND INTRAOCULAR LENS PLACEMENT (IOC);  Surgeon: [name], MD;  Location: [place];  Service: Ophthalmology;  Laterality: Left;   CHOLECYSTECTOMY  1983   EYE SURGERY     GAS INSERTION Left 11/12/2012   Procedure: INSERTION OF GAS;  Surgeon: Hayden Pedro, MD;  Location: [facility];  Service: Ophthalmology;  Laterality: Left;  C3F8 (expires 11/2014)   INSERT / REPLACE / REMOVE PACEMAKER     2008, DR Dorance Spink/Dr. Minna Merritts   KNEE ARTHROSCOPY     1990S  RT    MEMBRANE PEEL Left 11/12/2012   Procedure: MEMBRANE PEEL;  Surgeon: Hayden Pedro, MD;  Location: [place];  Service: Ophthalmology;  Laterality: Left;   OSTECTOMY Right 09/04/2016   Procedure: OSTECTOMY, COMPLETE METATARSAL HEAD FIFTH RIGHT;  Surgeon: Landis Martins, DPM;  Location: [facility];  Service: Podiatry;  Laterality: Right;   PARS PLANA VITRECTOMY Left 11/12/2012   with macular hole    Dr Ines Bloomer WITH LASER Left 11/12/2012   Procedure: HEADSCOPE LASER;  Surgeon: Hayden Pedro, MD;  Location: [place];  Service: Ophthalmology;  Laterality: Left;   SERUM PATCH Left 11/12/2012   Procedure: SERUM PATCH;  Surgeon: Hayden Pedro, MD;  Location: [place];  Service: Ophthalmology;  Laterality: Left;   TONSILLECTOMY       Current Meds  Medication Sig   acetaminophen (TYLENOL) 500 MG tablet Take 1,000 mg by mouth at bedtime as needed (knee pain).    allopurinol (ZYLOPRIM) 100 MG tablet Take 100 mg by mouth daily.   ALPRAZolam (XANAX) 0.25 MG tablet Take 0.25 mg by mouth at bedtime.   aspirin EC 81 MG tablet Take 81 mg by mouth daily.   benazepril (LOTENSIN) 40 MG tablet Take 40 mg by mouth daily.   Cholecalciferol (VITAMIN D) 2000 UNITS CAPS Take 2,000 Units by mouth daily.   estradiol (ESTRACE) 0.5 MG tablet Take 0.5 mg by mouth daily.   furosemide (LASIX) 40 MG  tablet Take 40 mg by mouth 2 (two) times daily.    gabapentin (NEURONTIN) 800 MG tablet Take 800 mg by mouth at bedtime.    glipiZIDE (GLUCOTROL XL) 5 MG 24 hr tablet Take 5 mg by mouth daily with breakfast.    Lactobacillus (ACIDOPHILUS) CAPS capsule Take 1 capsule by mouth daily.   levothyroxine (SYNTHROID, LEVOTHROID) 112 MCG tablet Take 112 mcg by mouth daily before breakfast.    lovastatin (MEVACOR) 40 MG tablet Take 40 mg by mouth 2 (two) times daily.   metoprolol succinate (TOPROL-XL) 50 MG 24 hr tablet Take 50 mg by mouth daily. Take with or immediately following a meal.   omeprazole (PRILOSEC) 20 MG capsule Take 20 mg by mouth daily.   potassium  chloride (MICRO-K) 10 MEQ CR capsule Take 10 mEq by mouth 2 (two) times daily.    tiZANidine (ZANAFLEX) 4 MG tablet Take 2 mg by mouth at bedtime.   warfarin (COUMADIN) 3 MG tablet Take 3-6 mg by mouth daily at 6 PM. Take 2 tablets (6 mg) by mouth on Saturday and Tuesday, Thursday and take 1 tablet (3 mg) on Sunday, Monday, Wednesday, and Friday     Allergies:   Patient has no known allergies.   Social History   Tobacco Use   Smoking status: Never Smoker   Smokeless tobacco: Never Used  Substance Use Topics   Alcohol use: No   Drug use: No     Family Hx: The patient's family history includes Heart attack in her father; Hypertension in her father.  ROS:   Please see the history of present illness.     All other systems reviewed and are negative.   Prior CV studies:   The following studies were reviewed today:  Pacer check 02/25/18:   Conclusion  Normal remote reviewed.  Next remote 05/27/18. [facility]  Result Report  Battery Impedance: 253 ohm     Battery Remaining Longevity: 99 mo      Battery Status: OK      Battery Voltage: 2.80 V      Brady Statistic RV Percent Paced: 99 %     Clinic Name: [facility]       Date Time Interrogation Session: [id number]      Implantable Lead Implant  Date: [id number]       Implantable Lead Implant Date: [id number]      Implantable Lead Location: 702637       Implantable Lead Location: 858850      Implantable Lead Location Detail 1: UNKNOWN       Implantable Lead Location Detail 1: UNKNOWN      Implantable Lead Manufacturer: MERM       Implantable Lead Manufacturer: MERM      Implantable Lead Model: 2774 CapsureFix Novus       Implantable Lead Model: 5076 CapSureFix Novus      Implantable Lead Serial Number: [id number]       Implantable Lead Serial Number: [id number]      Implantable Pulse Generator Implant Date: [id number]       Implantable Pulse Generator Type: Implantable Pulse Generator      Lead Channel Impedance Value: 67 ohm      Lead Channel Impedance Value: 521 ohm     Lead Channel Pacing Threshold Amplitude: 1.250 V      Lead Channel Pacing Threshold Pulse Width: 0.40 ms     Lead Channel Setting Pacing Amplitude: 2.500 V      Lead Channel Setting Pacing Pulse Width: 0.76 ms     Lead Channel Setting Sensing Sensitivity: 1.00 mV      Pulse Gen Model: [id number] Adapta      Pulse Gen Serial Number: [id number]       Pulse Generator Manufacturer: [employer]M           Labs/Other Tests and Data Reviewed:    EKG:  2 ED EKG show VVI paced rhythm with AF  Recent Labs: No results found for requested labs within last 8760 hours.   Recent Lipid Panel No results found for: CHOL, TRIG, HDL, CHOLHDL, LDLCALC, LDLDIRECT  Wt Readings from Last 3 Encounters:  05/20/18 217 lb 12.8 oz (98.8 kg)  04/11/18 222 lb (100.7 kg)  01/22/18 219 lb 3.2 oz (99.4 kg)  Objective:    Vital Signs:  BP 111/65 (BP Location: Left Wrist)    Ht 5\' 2"  (1.575 m)    Wt 217 lb 12.8 oz (98.8 kg)    BMI 39.84 kg/m    VITAL SIGNS:  reviewed her mood affect thought and perception are normal she is alert and oriented x3 and has no wheezing or respiratory distress during conversation  ASSESSMENT & PLAN:    1. Heart failure  chronic diastolic decompensated she was class III to class IV admission to the ED now she is class II.  I suspect with her obesity that we missed heart failure worsening I will switch her to a more potent diuretic and a higher dose and see if I can get her set up for home visit with the lung water determination.  Once we get through COVID-19 she requires an echocardiogram.  Should continue sodium restrict and weigh daily. 2. Atrial fibrillation stable she has had AV nodal ablation and she is permanently paced followed in our device clinic. 3. Pacemaker function normal and recent device check put in the note I reviewed with the patient 4. Chronic anticoagulation stable goal INR 2.5 managed with her PCP 5. Hypertensive heart disease improved after diuresis BP at target and continue treatment including ACE inhibitor beta-blocker 6. Diabetes stable managed by her PCP additional therapy as needed SGLT2 agents are particularly efficacious in patients of heart failure 7. Hyperlipidemia stable continue her statin  COVID-19 Education: The signs and symptoms of COVID-19 were discussed with the patient and how to seek care for testing (follow up with PCP or arrange E-visit).  The importance of social distancing was discussed today.  Time:   Today, I have spent 25 minutes with the patient with telehealth technology discussing the above problems.     Medication Adjustments/Labs and Tests Ordered: Current medicines are reviewed at length with the patient today.  Concerns regarding medicines are outlined above.   Tests Ordered: No orders of the defined types were placed in this encounter.   Medication Changes: No orders of the defined types were placed in this encounter.   Disposition:  Follow up 3-4 weeks  Signed, Shirlee More, MD  05/20/2018 3:35 PM    [place]

## 2018-05-20 ENCOUNTER — Other Ambulatory Visit: Payer: Self-pay

## 2018-05-20 ENCOUNTER — Telehealth (INDEPENDENT_AMBULATORY_CARE_PROVIDER_SITE_OTHER): Payer: Medicare HMO | Admitting: Cardiology

## 2018-05-20 ENCOUNTER — Encounter: Payer: Self-pay | Admitting: Cardiology

## 2018-05-20 VITALS — BP 111/65 | Ht 62.0 in | Wt 217.8 lb

## 2018-05-20 DIAGNOSIS — I5032 Chronic diastolic (congestive) heart failure: Secondary | ICD-10-CM

## 2018-05-20 DIAGNOSIS — Z950 Presence of cardiac pacemaker: Secondary | ICD-10-CM

## 2018-05-20 DIAGNOSIS — I110 Hypertensive heart disease with heart failure: Secondary | ICD-10-CM

## 2018-05-20 DIAGNOSIS — I442 Atrioventricular block, complete: Secondary | ICD-10-CM

## 2018-05-20 DIAGNOSIS — I482 Chronic atrial fibrillation, unspecified: Secondary | ICD-10-CM

## 2018-05-20 DIAGNOSIS — Z7901 Long term (current) use of anticoagulants: Secondary | ICD-10-CM

## 2018-05-20 MED ORDER — TORSEMIDE 20 MG PO TABS: 20.0000 mg | ORAL_TABLET | Freq: Two times a day (BID) | ORAL | 1 refills | Status: DC

## 2018-05-20 NOTE — Patient Instructions (Addendum)
Medication Instructions:  Your physician has recommended you make the following change in your medication: STOP: Furosemide START: Torsemide 20 mg(1 tab) twice daily. Take an extra tab for days weight is 220lbs or greater.  If you need a refill on your cardiac medications before your next appointment, please call your pharmacy.   Lab work: None  If you have labs (blood work) drawn today and your tests are completely normal, you will receive your results only by: Marland Kitchen MyChart Message (if you have MyChart) OR . A paper copy in the mail If you have any lab test that is abnormal or we need to change your treatment, we will call you to review the results.  Testing/Procedures: None  Follow-Up: At [facility], you and your health needs are our priority.  As part of our continuing mission to provide you with exceptional heart care, we have created designated Provider Care Teams.  These Care Teams include your primary Cardiologist (physician) and Advanced Practice Providers (APPs -  Physician Assistants and Nurse Practitioners) who all work together to provide you with the care you need, when you need it. . You will need a follow up appointment in 2 weeks.  Any Other Special Instructions Will Be Listed Below (If Applicable).  YOU ARE BEING REFERRED TO [facility] FOR HEART FAILURE. [facility] WILL CONTACT YOU FOR A THEIR APPOINTMENTS   Heart Failure  Weigh yourself every morning when you first wake up and record on a calender or note pad, bring this to your office visits. Using a pill tender can help with taking your medications consistently.  Limit your fluid intake to 2 liters daily  Limit your sodium intake to less than 2-3 grams daily. Ask if you need dietary teaching.  If you gain more than 3 pounds (from your dry weight ), double your dose of diuretic for the day.  If you gain more than 5 pounds (from your dry weight), double your dose of lasix and call your heart failure  doctor.  Please do not smoke tobacco since it is very bad for your heart.  Please do not drink alcohol since it can worsen your heart failure.Also avoid OTC nonsteroidal drugs, such as advil, aleve and motrin.  Try to exercise for at least 30 minutes every day because this will help your heart be more efficient. You may be eligible for supervised cardiac rehab, ask your physician.    Heart Failure  Weigh yourself every morning when you first wake up and record on a calender or note pad, bring this to your office visits. Using a pill tender can help with taking your medications consistently.  Limit your fluid intake to 2 liters daily  Limit your sodium intake to less than 2-3 grams daily. Ask if you need dietary teaching.  If you gain more than 3 pounds (from your dry weight ), double your dose of diuretic for the day.  If you gain more than 5 pounds (from your dry weight), double your dose of lasix and call your heart failure doctor.  Please do not smoke tobacco since it is very bad for your heart.  Please do not drink alcohol since it can worsen your heart failure.Also avoid OTC nonsteroidal drugs, such as advil, aleve and motrin.  Try to exercise for at least 30 minutes every day because this will help your heart be more efficient. You may be eligible for supervised cardiac rehab, ask your physician.    Torsemide tablets What is this medicine?  TORSEMIDE (TORE se mide) is a diuretic. It helps you make more urine and to lose salt and excess water from your body. This medicine is used to treat high blood pressure, and edema or swelling from heart, kidney, or liver disease. This medicine may be used for other purposes; ask your health care provider or pharmacist if you have questions. COMMON BRAND NAME(S): Demadex What should I tell my health care provider before I take this medicine? They need to know if you have any of these conditions: -abnormal blood  electrolytes -diabetes -gout -heart disease -kidney disease -liver disease -small amounts of urine, or difficulty passing urine -an unusual or allergic reaction to torsemide, sulfa drugs, other medicines, foods, dyes, or preservatives -pregnant or trying to get pregnant -breast-feeding How should I use this medicine? Take this medicine by mouth with a glass of water. Follow the directions on the prescription label. You may take this medicine with or without food. If it upsets your stomach, take it with food or milk. Do not take your medicine more often than directed. Remember that you will need to pass more urine after taking this medicine. Do not take your medicine at a time of day that will cause you problems. Do not take at bedtime. Talk to your pediatrician regarding the use of this medicine in children. Special care may be needed. Overdosage: If you think you have taken too much of this medicine contact a poison control center or emergency room at once. NOTE: This medicine is only for you. Do not share this medicine with others. What if I miss a dose? If you miss a dose, take it as soon as you can. If it is almost time for your next dose, take only that dose. Do not take double or extra doses. What may interact with this medicine? -alcohol -certain antibiotics given by injection certain heart medicines like digoxin -diuretics -lithium -medicines for diabetes -medicines for blood pressure -medicines for cholesterol like cholestyramine -medicines that relax muscles for surgery -NSAIDs, medicines for pain and inflammation, like ibuprofen or naproxen -OTC supplements like ginseng and ephedra -probenecid -steroid medicines like prednisone or cortisone This list may not describe all possible interactions. Give your health care provider a list of all the medicines, herbs, non-prescription drugs, or dietary supplements you use. Also tell them if you smoke, drink alcohol, or use illegal  drugs. Some items may interact with your medicine. What should I watch for while using this medicine? Visit your doctor or health care professional for regular checks on your progress. Check your blood pressure regularly. Ask your doctor or health care professional what your blood pressure should be, and when you should contact him or her. If you are a diabetic, check your blood sugar as directed. You may need to be on a special diet while taking this medicine. Check with your doctor. Also, ask how many glasses of fluid you need to drink a day. You must not get dehydrated. You may get drowsy or dizzy. Do not drive, use machinery, or do anything that needs mental alertness until you know how this drug affects you. Do not stand or sit up quickly, especially if you are an older patient. This reduces the risk of dizzy or fainting spells. Alcohol can make you more drowsy and dizzy. Avoid alcoholic drinks. What side effects may I notice from receiving this medicine? Side effects that you should report to your doctor or health care professional as soon as possible: -allergic reactions such as skin  rash or itching, hives, swelling of the lips, mouth, tongue or throat -blood in urine or stool -dry mouth -hearing loss or ringing in the ears -irregular heartbeat -muscle pain, weakness or cramps -pain or difficulty passing urine -unusually weak or tired -vomiting or diarrhea Side effects that usually do not require medical attention (report to your doctor or health care professional if they continue or are bothersome): -dizzy or lightheaded -headache -increased thirst -passing large amounts of urine -sexual difficulties -stomach pain, upset or nausea This list may not describe all possible side effects. Call your doctor for medical advice about side effects. You may report side effects to FDA at 1-800-FDA-1088. Where should I keep my medicine? Keep out of the reach of children. Store at room  temperature between 15 and 30 degrees C (59 and 86 degrees F). Throw away any unused medicine after the expiration date. NOTE: This sheet is a summary. It may not cover all possible information. If you have questions about this medicine, talk to your doctor, pharmacist, or health care provider.  2019 Elsevier/Gold Standard (2007-09-19 11:35:45)

## 2018-05-21 DIAGNOSIS — I509 Heart failure, unspecified: Secondary | ICD-10-CM | POA: Diagnosis not present

## 2018-05-21 DIAGNOSIS — Z79899 Other long term (current) drug therapy: Secondary | ICD-10-CM | POA: Diagnosis not present

## 2018-05-21 DIAGNOSIS — R0602 Shortness of breath: Secondary | ICD-10-CM | POA: Diagnosis not present

## 2018-05-21 DIAGNOSIS — I10 Essential (primary) hypertension: Secondary | ICD-10-CM | POA: Diagnosis not present

## 2018-05-21 DIAGNOSIS — R791 Abnormal coagulation profile: Secondary | ICD-10-CM | POA: Diagnosis not present

## 2018-05-21 DIAGNOSIS — Z6841 Body Mass Index (BMI) 40.0 and over, adult: Secondary | ICD-10-CM | POA: Diagnosis not present

## 2018-05-21 MED ORDER — TORSEMIDE 20 MG PO TABS: ORAL_TABLET | ORAL | 1 refills | Status: DC

## 2018-05-21 NOTE — Addendum Note (Signed)
Addended by: Austin Miles on: 05/21/2018 10:16 AM   Modules accepted: Orders

## 2018-05-22 ENCOUNTER — Other Ambulatory Visit: Payer: Self-pay

## 2018-05-22 ENCOUNTER — Ambulatory Visit: Payer: Medicare HMO | Admitting: Sports Medicine

## 2018-05-22 ENCOUNTER — Encounter: Payer: Self-pay | Admitting: Sports Medicine

## 2018-05-22 VITALS — Temp 99.1°F | Resp 16

## 2018-05-22 DIAGNOSIS — B351 Tinea unguium: Secondary | ICD-10-CM | POA: Diagnosis not present

## 2018-05-22 DIAGNOSIS — M79675 Pain in left toe(s): Secondary | ICD-10-CM | POA: Diagnosis not present

## 2018-05-22 DIAGNOSIS — I739 Peripheral vascular disease, unspecified: Secondary | ICD-10-CM | POA: Diagnosis not present

## 2018-05-22 DIAGNOSIS — M79674 Pain in right toe(s): Secondary | ICD-10-CM | POA: Diagnosis not present

## 2018-05-22 DIAGNOSIS — E119 Type 2 diabetes mellitus without complications: Secondary | ICD-10-CM | POA: Diagnosis not present

## 2018-05-23 ENCOUNTER — Encounter: Payer: Self-pay | Admitting: Sports Medicine

## 2018-05-23 NOTE — Progress Notes (Signed)
Subjective: [name] is a 78 y.o. female patient with history of diabetes who presents to office today complaining of long,mildly painful nails  while ambulating in shoes; unable to trim. Patient states that her left second toenail especially is curling in and is painful at the corners states that she usually goes for pedicures but is unable to due to the COVID-19 virus and since her nails have grown out into her skin especially at the left second toe and states that the pain in the toe is so bad that she cannot put on a shoe pain is 8 out of 10 that is sharp with pressure and sore with a shoe currently wearing open toe sandal.  Patient denies warmth redness swelling drainage nausea vomiting fever chills or any constitutional symptoms at this time.  Patient does admit swelling on both her legs of which her cardiologist is trying to control and her PCP has done blood work and she is awaiting the results to check her kidneys to see while she is retaining fluid.  Patient reports her glucose reading this morning was 74 mg/dl.  No other pedal complaints or issues noted.  Patient Active Problem List   Diagnosis Date Noted  . Chronic anticoagulation 07/17/2016  . Chronic diastolic heart failure ([place]) 07/17/2016  . Hypertensive heart disease with heart failure ([place]) 07/17/2016  . 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 Pacemaker reprogramming/check 11/25/2015  . Chronic atrial fibrillation 10/21/2014  . Dyslipidemia 10/21/2014  . Essential (primary) hypertension 10/21/2014  . Type 2 diabetes mellitus ([place]) 10/21/2014  . Pacemaker 08/13/2014  . Arthritis of knee, degenerative 11/20/2013  . Gonalgia 11/20/2013  . Macular hole, left eye 10/23/2012   Current Outpatient Medications on File Prior to Visit  Medication Sig Dispense Refill  . acetaminophen (TYLENOL) 500 MG tablet Take 1,000 mg by mouth at bedtime as needed (knee pain).     Marland Kitchen allopurinol (ZYLOPRIM) 100 MG tablet Take 100 mg by mouth daily.     Marland Kitchen ALPRAZolam (XANAX) 0.25 MG tablet Take 0.25 mg by mouth at bedtime.    Marland Kitchen aspirin EC 81 MG tablet Take 81 mg by mouth daily.    . benazepril (LOTENSIN) 40 MG tablet Take 40 mg by mouth daily.    . Cholecalciferol (VITAMIN D) 2000 UNITS CAPS Take 2,000 Units by mouth daily.    Marland Kitchen estradiol (ESTRACE) 0.5 MG tablet Take 0.5 mg by mouth daily.    Marland Kitchen gabapentin (NEURONTIN) 800 MG tablet Take 800 mg by mouth at bedtime.     Marland Kitchen glipiZIDE (GLUCOTROL XL) 5 MG 24 hr tablet Take 5 mg by mouth daily with breakfast.     . Lactobacillus (ACIDOPHILUS) CAPS capsule Take 1 capsule by mouth daily.    Marland Kitchen levothyroxine (SYNTHROID, LEVOTHROID) 112 MCG tablet Take 112 mcg by mouth daily before breakfast.     . lovastatin (MEVACOR) 40 MG tablet Take 40 mg by mouth 2 (two) times daily.    . metFORMIN (GLUCOPHAGE) 500 MG tablet Take 500 mg by mouth daily with breakfast.    . metoprolol succinate (TOPROL-XL) 50 MG 24 hr tablet Take 50 mg by mouth daily. Take with or immediately following a meal.    . omeprazole (PRILOSEC) 20 MG capsule Take 20 mg by mouth daily.    . potassium chloride (MICRO-K) 10 MEQ CR capsule Take 10 mEq by mouth 2 (two) times daily.     Marland Kitchen tiZANidine (ZANAFLEX) 4 MG tablet Take 2 mg by mouth at bedtime.    Marland Kitchen  torsemide (DEMADEX) 20 MG tablet Take 1 tablet twice daily. If your weight is 220 or greater, take 1 extra tablet daily. 180 tablet 1  . warfarin (COUMADIN) 3 MG tablet Take 3-6 mg by mouth daily at 6 PM. Take 2 tablets (6 mg) by mouth on Saturday and Tuesday, Thursday and take 1 tablet (3 mg) on Sunday, Monday, Wednesday, and Friday     No current facility-administered medications on file prior to visit.    No Known Allergies  Recent Results (from the past 2160 hour(s))  CUP PACEART REMOTE DEVICE CHECK     Status: None   Collection Time: 02/25/18  5:22 PM  Result Value Ref Range   Date Time Interrogation Session [id number]    Pulse Generator Manufacturer MERM    Pulse Gen Model  ADDRL1 Adapta    Pulse Gen Serial Number [id number]    Clinic Name [facility]    Implantable Pulse Generator Type Implantable Pulse Generator    Implantable Pulse Generator Implant Date [id number]    Implantable Lead Manufacturer [facility]    Implantable Lead Model 4076 CapsureFix Novus    Implantable Lead Serial Number [id number]    Implantable Lead Implant Date 09381829    Implantable Lead Location Detail 1 UNKNOWN    Implantable Lead Location [id number]    Implantable Lead Manufacturer MERM    Implantable Lead Model 5076 CapSureFix Novus    Implantable Lead Serial Number [id number]    Implantable Lead Implant Date [id number]    Implantable Lead Location Detail 1 UNKNOWN    Implantable Lead Location [id number]    Lead Channel Setting Sensing Sensitivity 1.00 mV   Lead Channel Setting Pacing Pulse Width 0.76 ms   Lead Channel Setting Pacing Amplitude 2.500 V   Lead Channel Impedance Value 67 ohm   Lead Channel Impedance Value 521 ohm   Lead Channel Pacing Threshold Amplitude 1.250 V   Lead Channel Pacing Threshold Pulse Width 0.40 ms   Battery Status OK    Battery Remaining Longevity 99 mo   Battery Voltage 2.80 V   Battery Impedance 253 ohm   Brady Statistic RV Percent Paced 99 %    Objective: General: Patient is awake, alert, and oriented x 3 and in no acute distress.  Integument: Skin is warm, dry and supple bilateral. Nails are tender, long, thickened and  dystrophic with subungual debris, consistent with onychomycosis, 1-5 bilateral with mild incurvation noted at the left second toe. No signs of infection.  Old surgical scar on right foot well-healed.  No open lesions or preulcerative lesions present bilateral. Remaining integument unremarkable.  Vasculature:  Dorsalis Pedis pulse 1/4 bilateral. Posterior Tibial pulse 0 /4 bilateral.  Capillary fill time <3 sec 1-5 bilateral.  Scant hair growth to the level of the digits. Temperature gradient within normal limits.  Moderate  varicosities and hyperpigmentation present bilateral.  1+ pitting edema present bilateral.   Neurology: The patient has intact sensation measured with a 5.07/10g Semmes Weinstein Monofilament at all pedal sites bilateral. Vibratory sensation diminished bilateral with tuning fork. No Babinski sign present bilateral.   Musculoskeletal: Asymptomatic pes planus pedal deformities noted bilateral. Muscular strength 5/5 in all lower extremity muscular groups bilateral without pain on range of motion . No tenderness with calf compression bilateral.  Assessment and Plan: Problem List Items Addressed This Visit    None    Visit Diagnoses    Pain due to onychomycosis of toenails of both feet    -  Primary   Diabetes  mellitus without complication (HCC)       PVD (peripheral vascular disease) ([place])          -Examined patient. -Discussed and educated patient on diabetic foot care, especially with  regards to the vascular, neurological and musculoskeletal systems.  -Stressed the importance of good glycemic control and the detriment of not  controlling glucose levels in relation to the foot. -Mechanically debrided all nails 1-5 bilateral and removed offending nail corners at the left second toe using sterile nail nipper and filed with dremel without incident  -Dispensed 6 inch Ace wraps to use on legs to assist with edema control and advised patient to continue with cardiology and PCP follow-up and rest and elevation -Answered all patient questions -Patient to return as needed for diabetic foot check and nail care -Advised patient to monitor left second toe if pain recurs to apply antibiotic cream and to soak with Epson salt and return to office for follow-up evaluation -Patient advised to call the office if any problems or questions arise in the meantime.  Landis Martins, DPM

## 2018-05-27 ENCOUNTER — Other Ambulatory Visit: Payer: Self-pay

## 2018-05-27 ENCOUNTER — Ambulatory Visit (INDEPENDENT_AMBULATORY_CARE_PROVIDER_SITE_OTHER): Payer: Medicare HMO | Admitting: *Deleted

## 2018-05-27 DIAGNOSIS — I5032 Chronic diastolic (congestive) heart failure: Secondary | ICD-10-CM | POA: Diagnosis not present

## 2018-05-27 DIAGNOSIS — I482 Chronic atrial fibrillation, unspecified: Secondary | ICD-10-CM

## 2018-05-28 LAB — CUP PACEART REMOTE DEVICE CHECK
Battery Impedance: 253 Ohm
Battery Remaining Longevity: 100 mo
Battery Voltage: 2.79 V
Brady Statistic RV Percent Paced: 98 %
Date Time Interrogation Session: 20200511124854
Implantable Lead Implant Date: 20080520
Implantable Lead Implant Date: 20080520
Implantable Lead Location: 753859
Implantable Lead Location: 753860
Implantable Lead Model: 4076
Implantable Lead Model: 5076
Implantable Pulse Generator Implant Date: 20161027
Lead Channel Impedance Value: 530 Ohm
Lead Channel Impedance Value: 67 Ohm
Lead Channel Pacing Threshold Amplitude: 1.5 V
Lead Channel Pacing Threshold Pulse Width: 0.4 ms
Lead Channel Setting Pacing Amplitude: 2.5 V
Lead Channel Setting Pacing Pulse Width: 0.76 ms
Lead Channel Setting Sensing Sensitivity: 1 mV

## 2018-06-05 ENCOUNTER — Encounter: Payer: Self-pay | Admitting: Cardiology

## 2018-06-05 DIAGNOSIS — Z6841 Body Mass Index (BMI) 40.0 and over, adult: Secondary | ICD-10-CM | POA: Diagnosis not present

## 2018-06-05 DIAGNOSIS — Z79899 Other long term (current) drug therapy: Secondary | ICD-10-CM | POA: Diagnosis not present

## 2018-06-05 DIAGNOSIS — R0602 Shortness of breath: Secondary | ICD-10-CM | POA: Diagnosis not present

## 2018-06-05 DIAGNOSIS — K5900 Constipation, unspecified: Secondary | ICD-10-CM | POA: Diagnosis not present

## 2018-06-05 DIAGNOSIS — R252 Cramp and spasm: Secondary | ICD-10-CM | POA: Diagnosis not present

## 2018-06-05 DIAGNOSIS — I10 Essential (primary) hypertension: Secondary | ICD-10-CM | POA: Diagnosis not present

## 2018-06-05 DIAGNOSIS — R7989 Other specified abnormal findings of blood chemistry: Secondary | ICD-10-CM | POA: Diagnosis not present

## 2018-06-06 NOTE — Progress Notes (Signed)
Virtual Visit via Telephone Note   This visit type was conducted due to national recommendations for restrictions regarding the COVID-19 Pandemic (e.g. social distancing) in an effort to limit this patient's exposure and mitigate transmission in our community.  Due to her co-morbid illnesses, this patient is at least at moderate risk for complications without adequate follow up.  This format is felt to be most appropriate for this patient at this time.  The patient did not have access to video technology/had technical difficulties with video requiring transitioning to audio format only (telephone).  All issues noted in this document were discussed and addressed.  No physical exam could be performed with this format.  Please refer to the patient's chart for her  consent to telehealth for [facility].   Date:  06/06/2018   ID:  [name], DOB 06-16-1940, MRN [id number]  Patient Location: Home Provider Location: Office  PCP:  Ernestene Kiel, MD  Cardiologist:  Shirlee More, MD  Electrophysiologist:  Constance Haw, MD   Evaluation Performed:  Follow-Up Visit  Chief Complaint:  78 yo female presents for 2 week follow up of hypertensive heart disease with heart failure after diuretic change.   History of Present Illness:    [name] is a 78 y.o. female with PMH of CHF, chronic atrial fibrillation with AV nodal ablation, chronic anticoagulation on Warfarin, HTN, and PPM last seen 05/20/18.   At this visit we discussed a [facility] ED visit for acute decompensted HF; ProBNP at [facility] was 2020. Diuretics were changed to Torsemide 20mg  BID and additional tablet if weight 22lb or greater. She was set up with [facility] for HF monitoring. Reds vest 34% with compensated heart failure on optimal diuretic.  Plan to do echocardiogram after COVID19 restrictions are lifted.   The patient does not have symptoms concerning for COVID-19 infection (fever, chills,  cough, or new shortness of breath).   Unfortunately she has no access to a smart phone or Internet.  She is very pleased with the care she received with a not novel home heart failure program her weights are stable she has minimal edema only in the left ankle with chronic venous insufficiency no shortness of breath strength and endurance are back to normal she tells me is the best she has felt in a long period of time her weights are staying under 220 pounds no orthopnea shortness of breath chest pain palpitation or syncope.  She references lab work done in the last few weeks at [facility] and she tells me her BNP level was 200 which I think in her age group with atrial fibrillation and heart failure is a stable good number I am in a reach out to access those labs and I will addend this note.  We discussed the utility of a repeat echocardiogram and I think it safe to do it with the waning of COVID-19 and will set her up with class II indication for heart failure to be performed outpatient in my office. Past Medical History:  Diagnosis Date  . Arthritis    SPURS, DDD, NECK  . Diabetes mellitus without complication [facility])    dx  2008 ?  Marland Kitchen Dysrhythmia    afib ([facility], Dr. Bettina Gavia)  . Edema    legs , left leg weeping, is wrapped 10/28/13  . GERD (gastroesophageal reflux disease)   . H/O hiatal hernia   . Headache    h/o 'muscular vascular' headaches  . Hypertension   .  Hypothyroidism   . Mucous colitis   . Pacemaker   . Phlebitis    hx of  . Pulmonary hypertension ([place])   . Shortness of breath    due to pulmonary hypeertension  . Stroke [facility])    1998   Past Surgical History:  Procedure Laterality Date  . [place] VITRECTOMY WITH 20 GAUGE MVR PORT FOR MACULAR HOLE Left 11/12/2012   Procedure: 25 GAUGE PARS PLANA VITRECTOMY WITH 20 GAUGE MVR PORT FOR MACULAR HOLE;  Surgeon: Hayden Pedro, MD;  Location: [place];  Service: Ophthalmology;  Laterality: Left;  . ABDOMINAL  HYSTERECTOMY  1977  . CATARACT EXTRACTION W/PHACO Left 10/29/2013   Procedure: LEFT CATARACT EXTRACTION PHACO AND INTRAOCULAR LENS PLACEMENT (IOC);  Surgeon: [name], MD;  Location: [place];  Service: Ophthalmology;  Laterality: Left;  . CHOLECYSTECTOMY  1983  . EYE SURGERY    . GAS INSERTION Left 11/12/2012   Procedure: INSERTION OF GAS;  Surgeon: Hayden Pedro, MD;  Location: [place];  Service: Ophthalmology;  Laterality: Left;  C3F8 (expires 11/2014)  . INSERT / REPLACE / REMOVE PACEMAKER     2008, DR Jyl [place]/Dr. Minna Merritts  . KNEE ARTHROSCOPY     1990S  RT   . MEMBRANE PEEL Left 11/12/2012   Procedure: MEMBRANE PEEL;  Surgeon: Hayden Pedro, MD;  Location: [place];  Service: Ophthalmology;  Laterality: Left;  . OSTECTOMY Right 09/04/2016   Procedure: OSTECTOMY, COMPLETE METATARSAL HEAD FIFTH RIGHT;  Surgeon: Landis Martins, DPM;  Location: [place];  Service: Podiatry;  Laterality: Right;  . PARS PLANA VITRECTOMY Left 11/12/2012   with macular hole    Dr Zigmund Daniel  . PHOTOCOAGULATION WITH LASER Left 11/12/2012   Procedure: HEADSCOPE LASER;  Surgeon: Hayden Pedro, MD;  Location: [place];  Service: Ophthalmology;  Laterality: Left;  . SERUM PATCH Left 11/12/2012   Procedure: SERUM PATCH;  Surgeon: Hayden Pedro, MD;  Location: [place];  Service: Ophthalmology;  Laterality: Left;  . TONSILLECTOMY       No outpatient medications have been marked as taking for the 06/07/18 encounter (Appointment) with Richardo Priest, MD.     Allergies:   Patient has no known allergies.   Social History   Tobacco Use  . Smoking status: Never Smoker  . Smokeless tobacco: Never Used  Substance Use Topics  . Alcohol use: No  . Drug use: No     Family Hx: The patient's family history includes Heart attack in her father; Hypertension in her father.  ROS:   Please see the history of present illness.     All other systems reviewed and are negative.   Prior CV studies:   The following studies  were reviewed today:  PPM Remote Device Check 05/27/18: Normal device function. 1 VHR, 6 beats 200 bpm.   Labs/Other Tests and Data Reviewed:      Recent Labs: No results found for requested labs within last 8760 hours.   Recent Lipid Panel No results found for: CHOL, TRIG, HDL, CHOLHDL, LDLCALC, LDLDIRECT  Wt Readings from Last 3 Encounters:  05/20/18 217 lb 12.8 oz (98.8 kg)  04/11/18 222 lb (100.7 kg)  01/22/18 219 lb 3.2 oz (99.4 kg)     Objective:    Vital Signs:  There were no vitals taken for this visit.   VITAL SIGNS:  reviewed her spirits mood affect thought cognition are normal she is alert and oriented she has no respiratory distress or  audible wheezing  ASSESSMENT & PLAN:    1. Hypertensive heart disease with heart failure -improved her heart failure is compensated New York Heart Association class I weights are stable and her lung water is in a compensated good category.  We will reach out and utilize the home heart failure program as necessary she will dose adjust her diuretic taking an extra tablet of torsemide for weight greater than 220 pounds help a close attention to symptoms sodium restriction and home self-management.  With her recent admission to the hospital deterioration I will see her at a shorter interval in 4 weeks and is mention echocardiogram to assess left ventricular function in the setting of atrial fibrillation 2. Chronic atrial fibrillation -stable rate is controlled after AV nodal ablation with permanent pacemaker 3. PPM -stable managed in our practice and has home telemetry her most recent INR 05/13/2018 was 1.9 goal 2.5 4. Chronic anticoagulation - Warfarin managed by PCP.  5. DM2 - Managed by PCP.   COVID-19 Education: The signs and symptoms of COVID-19 were discussed with the patient and how to seek care for testing (follow up with PCP or arrange E-visit).  The importance of social distancing was discussed today.  Time:   Today, I have spent  20 minutes with the patient with telehealth technology discussing the above problems.     Medication Adjustments/Labs and Tests Ordered: Current medicines are reviewed at length with the patient today.  Concerns regarding medicines are outlined above.   Tests Ordered: No orders of the defined types were placed in this encounter.   Medication Changes: No orders of the defined types were placed in this encounter.   Disposition:  Follow up in 4 week(s)  Signed, Shirlee More, MD  06/06/2018 2:49 PM    [place]

## 2018-06-06 NOTE — Progress Notes (Signed)
Remote pacemaker transmission.   

## 2018-06-07 ENCOUNTER — Encounter: Payer: Self-pay | Admitting: *Deleted

## 2018-06-07 ENCOUNTER — Encounter: Payer: Self-pay | Admitting: Cardiology

## 2018-06-07 ENCOUNTER — Other Ambulatory Visit: Payer: Self-pay

## 2018-06-07 ENCOUNTER — Telehealth (INDEPENDENT_AMBULATORY_CARE_PROVIDER_SITE_OTHER): Payer: Medicare HMO | Admitting: Cardiology

## 2018-06-07 VITALS — BP 122/64 | Ht 62.0 in | Wt 217.0 lb

## 2018-06-07 DIAGNOSIS — I482 Chronic atrial fibrillation, unspecified: Secondary | ICD-10-CM

## 2018-06-07 DIAGNOSIS — E119 Type 2 diabetes mellitus without complications: Secondary | ICD-10-CM

## 2018-06-07 DIAGNOSIS — I110 Hypertensive heart disease with heart failure: Secondary | ICD-10-CM

## 2018-06-07 DIAGNOSIS — Z950 Presence of cardiac pacemaker: Secondary | ICD-10-CM

## 2018-06-07 DIAGNOSIS — Z7901 Long term (current) use of anticoagulants: Secondary | ICD-10-CM

## 2018-06-07 DIAGNOSIS — I5032 Chronic diastolic (congestive) heart failure: Secondary | ICD-10-CM

## 2018-06-07 NOTE — Progress Notes (Signed)
I received the labs that were performed 06/05/2018 her BNP was 424 GFR 39 cc potassium 4.4

## 2018-06-07 NOTE — Patient Instructions (Addendum)
Medication Instructions:  Your physician recommends that you continue on your current medications as directed. Please refer to the Current Medication list given to you today.  If you need a refill on your cardiac medications before your next appointment, please call your pharmacy.   Lab work: None  If you have labs (blood work) drawn today and your tests are completely normal, you will receive your results only by: Marland Kitchen MyChart Message (if you have MyChart) OR . A paper copy in the mail If you have any lab test that is abnormal or we need to change your treatment, we will call you to review the results.  Testing/Procedures: Your physician has requested that you have an echocardiogram. Echocardiography is a painless test that uses sound waves to create images of your heart. It provides your doctor with information about the size and shape of your heart and how well your heart's chambers and valves are working. This procedure takes approximately one hour. There are no restrictions for this procedure. This will be done in the [place] office on Tuesday, 07/09/2018, at 8:15 am.    Follow-Up: At [facility], you and your health needs are our priority.  As part of our continuing mission to provide you with exceptional heart care, we have created designated Provider Care Teams.  These Care Teams include your primary Cardiologist (physician) and Advanced Practice Providers (APPs -  Physician Assistants and Nurse Practitioners) who all work together to provide you with the care you need, when you need it. .  Your physician recommends that you schedule a virtual follow-up appointment in 1 month: Friday, 07/12/2018, at 1:45 pm.       Echocardiogram An echocardiogram is a procedure that uses painless sound waves (ultrasound) to produce an image of the heart. Images from an echocardiogram can provide important information about:  Signs of coronary artery disease (CAD).  Aneurysm detection. An aneurysm  is a weak or damaged part of an artery wall that bulges out from the normal force of blood pumping through the body.  Heart size and shape. Changes in the size or shape of the heart can be associated with certain conditions, including heart failure, aneurysm, and CAD.  Heart muscle function.  Heart valve function.  Signs of a past heart attack.  Fluid buildup around the heart.  Thickening of the heart muscle.  A tumor or infectious growth around the heart valves. Tell a health care provider about:  Any allergies you have.  All medicines you are taking, including vitamins, herbs, eye drops, creams, and over-the-counter medicines.  Any blood disorders you have.  Any surgeries you have had.  Any medical conditions you have.  Whether you are pregnant or may be pregnant. What are the risks? Generally, this is a safe procedure. However, problems may occur, including:  Allergic reaction to dye (contrast) that may be used during the procedure. What happens before the procedure? No specific preparation is needed. You may eat and drink normally. What happens during the procedure?   An IV tube may be inserted into one of your veins.  You may receive contrast through this tube. A contrast is an injection that improves the quality of the pictures from your heart.  A gel will be applied to your chest.  A wand-like tool (transducer) will be moved over your chest. The gel will help to transmit the sound waves from the transducer.  The sound waves will harmlessly bounce off of your heart to allow the heart images to  be captured in real-time motion. The images will be recorded on a computer. The procedure may vary among health care providers and hospitals. What happens after the procedure?  You may return to your normal, everyday life, including diet, activities, and medicines, unless your health care provider tells you not to do that. Summary  An echocardiogram is a procedure that  uses painless sound waves (ultrasound) to produce an image of the heart.  Images from an echocardiogram can provide important information about the size and shape of your heart, heart muscle function, heart valve function, and fluid buildup around your heart.  You do not need to do anything to prepare before this procedure. You may eat and drink normally.  After the echocardiogram is completed, you may return to your normal, everyday life, unless your health care provider tells you not to do that. This information is not intended to replace advice given to you by your health care provider. Make sure you discuss any questions you have with your health care provider. Document Released: 12/31/1999 Document Revised: 02/05/2016 Document Reviewed: 02/05/2016 Elsevier Interactive Patient Education  2019 [employer].

## 2018-06-14 DIAGNOSIS — I10 Essential (primary) hypertension: Secondary | ICD-10-CM | POA: Diagnosis not present

## 2018-06-14 DIAGNOSIS — Z6841 Body Mass Index (BMI) 40.0 and over, adult: Secondary | ICD-10-CM | POA: Diagnosis not present

## 2018-06-14 DIAGNOSIS — Z79899 Other long term (current) drug therapy: Secondary | ICD-10-CM | POA: Diagnosis not present

## 2018-06-21 DIAGNOSIS — K5900 Constipation, unspecified: Secondary | ICD-10-CM | POA: Diagnosis not present

## 2018-06-21 DIAGNOSIS — I10 Essential (primary) hypertension: Secondary | ICD-10-CM | POA: Diagnosis not present

## 2018-06-21 DIAGNOSIS — I517 Cardiomegaly: Secondary | ICD-10-CM | POA: Diagnosis not present

## 2018-06-21 DIAGNOSIS — R195 Other fecal abnormalities: Secondary | ICD-10-CM | POA: Diagnosis not present

## 2018-06-21 DIAGNOSIS — Z9049 Acquired absence of other specified parts of digestive tract: Secondary | ICD-10-CM | POA: Diagnosis not present

## 2018-06-21 DIAGNOSIS — Z79899 Other long term (current) drug therapy: Secondary | ICD-10-CM | POA: Diagnosis not present

## 2018-06-21 DIAGNOSIS — K6289 Other specified diseases of anus and rectum: Secondary | ICD-10-CM | POA: Diagnosis not present

## 2018-06-21 DIAGNOSIS — Z7901 Long term (current) use of anticoagulants: Secondary | ICD-10-CM | POA: Diagnosis not present

## 2018-06-28 DIAGNOSIS — Z6841 Body Mass Index (BMI) 40.0 and over, adult: Secondary | ICD-10-CM | POA: Diagnosis not present

## 2018-06-28 DIAGNOSIS — K5900 Constipation, unspecified: Secondary | ICD-10-CM | POA: Diagnosis not present

## 2018-07-09 ENCOUNTER — Other Ambulatory Visit: Payer: Medicare HMO

## 2018-07-09 DIAGNOSIS — Z7901 Long term (current) use of anticoagulants: Secondary | ICD-10-CM | POA: Diagnosis not present

## 2018-07-10 ENCOUNTER — Ambulatory Visit: Payer: Medicare HMO | Admitting: Vascular Surgery

## 2018-07-12 ENCOUNTER — Ambulatory Visit (INDEPENDENT_AMBULATORY_CARE_PROVIDER_SITE_OTHER): Payer: Medicare HMO

## 2018-07-12 ENCOUNTER — Telehealth: Payer: Medicare HMO | Admitting: Cardiology

## 2018-07-12 ENCOUNTER — Other Ambulatory Visit: Payer: Self-pay

## 2018-07-12 DIAGNOSIS — I482 Chronic atrial fibrillation, unspecified: Secondary | ICD-10-CM

## 2018-07-12 DIAGNOSIS — I110 Hypertensive heart disease with heart failure: Secondary | ICD-10-CM | POA: Diagnosis not present

## 2018-07-12 DIAGNOSIS — I5032 Chronic diastolic (congestive) heart failure: Secondary | ICD-10-CM | POA: Diagnosis not present

## 2018-07-12 NOTE — Progress Notes (Signed)
  Echocardiogram 2D Echocardiogram has been performed.  Ovadia Lopp M     

## 2018-07-15 NOTE — Progress Notes (Signed)
Virtual Visit via Telephone Note   This visit type was conducted due to national recommendations for restrictions regarding the COVID-19 Pandemic (e.g. social distancing) in an effort to limit this patient's exposure and mitigate transmission in our community.  Due to her co-morbid illnesses, this patient is at least at moderate risk for complications without adequate follow up.  This format is felt to be most appropriate for this patient at this time.  The patient did not have access to video technology/had technical difficulties with video requiring transitioning to audio format only (telephone).  All issues noted in this document were discussed and addressed.  No physical exam could be performed with this format.  Please refer to the patient's chart for her  consent to telehealth for [facility].   Date:  07/16/2018   ID:  [name], DOB 1940/09/09, MRN [id number]  Patient Location: Home Provider Location: Office  PCP:  Ernestene Kiel, MD  Cardiologist:  Shirlee More, MD  Electrophysiologist:  Constance Haw, MD   Evaluation Performed:  Follow-Up Visit  Chief Complaint:  Heart failure  History of Present Illness:    [name] is a 78 y.o. female with a hx of CHF, Chronic Atrial Fibrillation with AV nodal ablation on warfarin, HTN, and a pacemaker last seen 06/07/2018 after [facility] ED visit with decompensated heart failure.  The patient does not have symptoms concerning for COVID-19 infection (fever, chills, cough, or new shortness of breath).   She continues to be troubled by weight gain she is up 4 to 5 pounds she is taking gabapentin I asked her to put aside.  She thinks she did better with furosemide and will switch her back to 40 twice daily and she continues to have problems she will need intermittent metolazone.  She has mild edema but no shortness of breath chest pain palpitation or syncope I reviewed her echocardiogram that shows preserved  systolic function.  Unfortunately she likes to cook for other people and I am concerned that her dietary sodium is not under control Past Medical History:  Diagnosis Date  . Arthritis    SPURS, DDD, NECK  . Diabetes mellitus without complication [facility])    dx  2008 ?  Marland Kitchen Dysrhythmia    afib ([facility], Dr. Bettina Gavia)  . Edema    legs , left leg weeping, is wrapped 10/28/13  . GERD (gastroesophageal reflux disease)   . H/O hiatal hernia   . Headache    h/o 'muscular vascular' headaches  . Hypertension   . Hypothyroidism   . Mucous colitis   . Pacemaker   . Phlebitis    hx of  . Pulmonary hypertension ([place])   . Shortness of breath    due to pulmonary hypeertension  . Stroke [facility])    1998   Past Surgical History:  Procedure Laterality Date  . [place] VITRECTOMY WITH 20 GAUGE MVR PORT FOR MACULAR HOLE Left 11/12/2012   Procedure: 25 GAUGE PARS PLANA VITRECTOMY WITH 20 GAUGE MVR PORT FOR MACULAR HOLE;  Surgeon: Hayden Pedro, MD;  Location: [place];  Service: Ophthalmology;  Laterality: Left;  . ABDOMINAL HYSTERECTOMY  1977  . CATARACT EXTRACTION W/PHACO Left 10/29/2013   Procedure: LEFT CATARACT EXTRACTION PHACO AND INTRAOCULAR LENS PLACEMENT (IOC);  Surgeon: [name], MD;  Location: [place];  Service: Ophthalmology;  Laterality: Left;  . CHOLECYSTECTOMY  1983  . EYE SURGERY    . GAS INSERTION Left 11/12/2012   Procedure: INSERTION  OF GAS;  Surgeon: Hayden Pedro, MD;  Location: [place];  Service: Ophthalmology;  Laterality: Left;  C3F8 (expires 11/2014)  . INSERT / REPLACE / REMOVE PACEMAKER     2008, DR  /Dr. Minna Merritts  . KNEE ARTHROSCOPY     1990S  RT   . MEMBRANE PEEL Left 11/12/2012   Procedure: MEMBRANE PEEL;  Surgeon: Hayden Pedro, MD;  Location: [place];  Service: Ophthalmology;  Laterality: Left;  . OSTECTOMY Right 09/04/2016   Procedure: OSTECTOMY, COMPLETE METATARSAL HEAD FIFTH RIGHT;  Surgeon: Landis Martins, DPM;  Location: [place];  Service:  Podiatry;  Laterality: Right;  . PARS PLANA VITRECTOMY Left 11/12/2012   with macular hole    Dr Zigmund Daniel  . PHOTOCOAGULATION WITH LASER Left 11/12/2012   Procedure: HEADSCOPE LASER;  Surgeon: Hayden Pedro, MD;  Location: [place];  Service: Ophthalmology;  Laterality: Left;  . SERUM PATCH Left 11/12/2012   Procedure: SERUM PATCH;  Surgeon: Hayden Pedro, MD;  Location: [place];  Service: Ophthalmology;  Laterality: Left;  . TONSILLECTOMY       Current Meds  Medication Sig  . acetaminophen (TYLENOL) 500 MG tablet Take 1,000 mg by mouth at bedtime as needed (knee pain).   Marland Kitchen allopurinol (ZYLOPRIM) 100 MG tablet Take 100 mg by mouth daily.  Marland Kitchen ALPRAZolam (XANAX) 0.25 MG tablet Take 0.25 mg by mouth at bedtime.  Marland Kitchen aspirin EC 81 MG tablet Take 81 mg by mouth daily.  . Cholecalciferol (VITAMIN D) 2000 UNITS CAPS Take 2,000 Units by mouth daily.  Marland Kitchen docusate sodium (COLACE) 100 MG capsule Take 300 mg by mouth at bedtime.  Marland Kitchen estradiol (ESTRACE) 0.5 MG tablet Take 0.5 mg by mouth daily.  Marland Kitchen gabapentin (NEURONTIN) 800 MG tablet Take 800 mg by mouth at bedtime.   Marland Kitchen glipiZIDE (GLUCOTROL XL) 5 MG 24 hr tablet Take 5 mg by mouth daily with breakfast.   . Lactobacillus (ACIDOPHILUS) CAPS capsule Take 1 capsule by mouth daily.  Marland Kitchen levothyroxine (SYNTHROID, LEVOTHROID) 112 MCG tablet Take 112 mcg by mouth daily before breakfast.   . lovastatin (MEVACOR) 40 MG tablet Take 40 mg by mouth 2 (two) times daily.  . metFORMIN (GLUCOPHAGE) 500 MG tablet Take 500 mg by mouth daily with breakfast.  . metoprolol succinate (TOPROL-XL) 50 MG 24 hr tablet Take 50 mg by mouth daily. Take with or immediately following a meal.  . omeprazole (PRILOSEC) 20 MG capsule Take 20 mg by mouth daily.  . polyethylene glycol (MIRALAX / GLYCOLAX) 17 g packet Take 17 g by mouth daily.  . potassium chloride (MICRO-K) 10 MEQ CR capsule Take 10 mEq by mouth 2 (two) times daily.   Marland Kitchen tiZANidine (ZANAFLEX) 4 MG tablet Take 2 mg by mouth at  bedtime.  . torsemide (DEMADEX) 20 MG tablet Take 20 mg by mouth 3 (three) times daily.  Marland Kitchen warfarin (COUMADIN) 3 MG tablet Take 3-6 mg by mouth daily at 6 PM. Take 2 tablets (6 mg) by mouth on Tuesday, Thursday, Saturday, and Sunday and take 1 tablet (3 mg) on Monday, Wednesday, and Friday     Allergies:   Patient has no known allergies.   Social History   Tobacco Use  . Smoking status: Never Smoker  . Smokeless tobacco: Never Used  Substance Use Topics  . Alcohol use: No  . Drug use: No     Family Hx: The patient's family history includes Heart attack in her father; Hypertension in her father.  ROS:  Please see the history of present illness.     All other systems reviewed and are negative.   Prior CV studies:   The following studies were reviewed today:  07/12/2018, echocardiogram shows ejection fraction 60 to 65% right ventricular size and function normal there is mild to moderate left atrial enlargement moderate mitral regurgitation and moderate tricuspid regurgitation.  Labs/Other Tests and Data Reviewed:    EKG:  No ECG reviewed.  Recent Labs:   Wt Readings from Last 3 Encounters:  07/16/18 221 lb (100.2 kg)  06/07/18 217 lb (98.4 kg)  05/20/18 217 lb 12.8 oz (98.8 kg)     Objective:    Vital Signs:  BP 139/82 (BP Location: Left Arm, Patient Position: Sitting)   Wt 221 lb (100.2 kg)   BMI 40.42 kg/m    VITAL SIGNS:  reviewed her mood affect thought cognition are normal AO3 no respiratory distress  ASSESSMENT & PLAN:    1. Heart failure mildly decompensated she will switch torsemide to furosemide and she continues to have problems we can add metolazone intermittently.  I asked her to put aside her gabapentin which may be a precipitant and plan close attention to sodium intake she will continue to monitor weight at home C face-to-face in the office in 6 weeks and she tells me she has had more recent labs at her PCP and will access those. 2. Stable blood  pressure continue current antihypertensives 3. Rate controlled after AV nodal ablation and pacemaker 4. Continue her current anticoagulant moderate stroke risk 5. Stable device function followed in our practice  COVID-19 Education: The signs and symptoms of COVID-19 were discussed with the patient and how to seek care for testing (follow up with PCP or arrange E-visit).  The importance of social distancing was discussed today.  Time:   Today, I have spent 20 minutes with the patient with telehealth technology discussing the above problems.     Medication Adjustments/Labs and Tests Ordered: Current medicines are reviewed at length with the patient today.  Concerns regarding medicines are outlined above.   Tests Ordered: No orders of the defined types were placed in this encounter.   Medication Changes: No orders of the defined types were placed in this encounter.   Follow Up:  In Person in 6 week(s)  Signed, Shirlee More, MD  07/16/2018 9:32 AM    [facility]

## 2018-07-16 ENCOUNTER — Telehealth (INDEPENDENT_AMBULATORY_CARE_PROVIDER_SITE_OTHER): Payer: Medicare HMO | Admitting: Cardiology

## 2018-07-16 ENCOUNTER — Other Ambulatory Visit: Payer: Self-pay

## 2018-07-16 ENCOUNTER — Encounter: Payer: Self-pay | Admitting: Cardiology

## 2018-07-16 VITALS — BP 139/82 | Wt 221.0 lb

## 2018-07-16 DIAGNOSIS — I110 Hypertensive heart disease with heart failure: Secondary | ICD-10-CM | POA: Diagnosis not present

## 2018-07-16 DIAGNOSIS — I5032 Chronic diastolic (congestive) heart failure: Secondary | ICD-10-CM | POA: Diagnosis not present

## 2018-07-16 DIAGNOSIS — Z7901 Long term (current) use of anticoagulants: Secondary | ICD-10-CM

## 2018-07-16 DIAGNOSIS — Z950 Presence of cardiac pacemaker: Secondary | ICD-10-CM | POA: Diagnosis not present

## 2018-07-16 DIAGNOSIS — I482 Chronic atrial fibrillation, unspecified: Secondary | ICD-10-CM | POA: Diagnosis not present

## 2018-07-16 MED ORDER — FUROSEMIDE 40 MG PO TABS: 40.0000 mg | ORAL_TABLET | Freq: Two times a day (BID) | ORAL | 3 refills | Status: DC

## 2018-07-16 NOTE — Patient Instructions (Signed)
Medication Instructions:  Your physician has recommended you make the following change in your medication:   STOP: Torsemide  START: Furosemide 40 mg : Take 1 tab twice daily  If you need a refill on your cardiac medications before your next appointment, please call your pharmacy.   Lab work: None If you have labs (blood work) drawn today and your tests are completely normal, you will receive your results only by: [name] MyChart Message (if you have MyChart) OR . A paper copy in the mail If you have any lab test that is abnormal or we need to change your treatment, we will call you to review the results.  Testing/Procedures: None  Follow-Up: At [facility], you and your health needs are our priority.  As part of our continuing mission to provide you with exceptional heart care, we have created designated Provider Care Teams.  These Care Teams include your primary Cardiologist (physician) and Advanced Practice Providers (APPs -  Physician Assistants and Nurse Practitioners) who all work together to provide you with the care you need, when you need it. You will need a follow up appointment in 6 weeks.   Any Other Special Instructions Will Be Listed Below (If Applicable).  KEEP WEIGHT LOG DAILY   KEEP BP LOG DAILY   Blood Pressure Record Sheet To take your blood pressure, you will need a blood pressure machine. You can buy a blood pressure machine (blood pressure monitor) at your clinic, drug store, or online. When choosing one, consider:  An automatic monitor that has an arm cuff.  A cuff that wraps snugly around your upper arm. You should be able to fit only one finger between your arm and the cuff.  A device that stores blood pressure reading results.  Do not choose a monitor that measures your blood pressure from your wrist or finger. Follow your health care provider's instructions for how to take your blood pressure. To use this form:  Get one reading in the morning (a.m.)  before you take any medicines.  Get one reading in the evening (p.m.) before supper.  Take at least 2 readings with each blood pressure check. This makes sure the results are correct. Wait 1-2 minutes between measurements.  Write down the results in the spaces on this form.  Repeat this once a week, or as told by your health care provider.  Make a follow-up appointment with your health care provider to discuss the results. Blood pressure log Date: _______________________  a.m. _____________________(1st reading) _____________________(2nd reading)  p.m. _____________________(1st reading) _____________________(2nd reading) Date: _______________________  a.m. _____________________(1st reading) _____________________(2nd reading)  p.m. _____________________(1st reading) _____________________(2nd reading) Date: _______________________  a.m. _____________________(1st reading) _____________________(2nd reading)  p.m. _____________________(1st reading) _____________________(2nd reading) Date: _______________________  a.m. _____________________(1st reading) _____________________(2nd reading)  p.m. _____________________(1st reading) _____________________(2nd reading) Date: _______________________  a.m. _____________________(1st reading) _____________________(2nd reading)  p.m. _____________________(1st reading) _____________________(2nd reading) This information is not intended to replace advice given to you by your health care provider. Make sure you discuss any questions you have with your health care provider. Document Released: 10/01/2002 Document Revised: 03/02/2017 Document Reviewed: 01/02/2017 Elsevier Patient Education  [place].   Daily Weight Record It is important to weigh yourself daily. To do this:  Make sure you use a reliable scale. Use the same scale each day.  Keep this daily weight chart near your scale.  Weigh yourself each morning at the same time.   Before weighing yourself: ? Take off your shoes. ? Make sure you  are wearing the same amount of clothing each day.  Write down your weight in the spaces on the form.  Compare today's weight to yesterday's weight.  Bring this form with you to your follow-up visits with your health care provider. Call your health care provider if you have concerns about your weight, including rapid weight gain or loss. Date: ________ Weight: ____________________ Date: ________ Weight: ____________________ Date: ________ Weight: ____________________ Date: ________ Weight: ____________________ Date: ________ Weight: ____________________ Date: ________ Weight: ____________________ Date: ________ Weight: ____________________ Date: ________ Weight: ____________________ Date: ________ Weight: ____________________ Date: ________ Weight: ____________________ Date: ________ Weight: ____________________ Date: ________ Weight: ____________________ Date: ________ Weight: ____________________ Date: ________ Weight: ____________________ Date: ________ Weight: ____________________ Date: ________ Weight: ____________________ Date: ________ Weight: ____________________ Date: ________ Weight: ____________________ Date: ________ Weight: ____________________ Date: ________ Weight: ____________________ Date: ________ Weight: ____________________ Date: ________ Weight: ____________________ Date: ________ Weight: ____________________ Date: ________ Weight: ____________________ Date: ________ Weight: ____________________ Date: ________ Weight: ____________________ Date: ________ Weight: ____________________ Date: ________ Weight: ____________________ Date: ________ Weight: ____________________ Date: ________ Weight: ____________________ Date: ________ Weight: ____________________ Date: ________ Weight: ____________________ Date: ________ Weight: ____________________ Date: ________ Weight: ____________________ Date: ________  Weight: ____________________ Date: ________ Weight: ____________________ Date: ________ Weight: ____________________ Date: ________ Weight: ____________________ Date: ________ Weight: ____________________ Date: ________ Weight: ____________________ Date: ________ Weight: ____________________ Date: ________ Weight: ____________________ Date: ________ Weight: ____________________ Date: ________ Weight: ____________________ Date: ________ Weight: ____________________ Date: ________ Weight: ____________________ Date: ________ Weight: ____________________ Date: ________ Weight: ____________________ Date: ________ Weight: ____________________ Date: ________ Weight: ____________________ This information is not intended to replace advice given to you by your health care provider. Make sure you discuss any questions you have with your health care provider. Document Released: 03/16/2006 Document Revised: 01/01/2017 Document Reviewed: 01/01/2017 Elsevier Patient Education  2020 [employer].

## 2018-07-18 ENCOUNTER — Telehealth: Payer: Self-pay | Admitting: *Deleted

## 2018-07-18 MED ORDER — FUROSEMIDE 40 MG PO TABS: 40.0000 mg | ORAL_TABLET | Freq: Two times a day (BID) | ORAL | 3 refills | Status: DC

## 2018-07-18 NOTE — Telephone Encounter (Signed)
Rx refill sent to pharmacy. Pt has started using [facility] so sent new rx in.  *STAT* If patient is at the pharmacy, call can be transferred to refill team.   1. Which medications need to be refilled? (please list name of each medication and dose if known) Furosemide 40 mg   2. Which pharmacy/location (including street and city if local pharmacy) is medication to be sent to?[place]  3. Do they need a 30 day or 90 day supply? [place]

## 2018-07-22 ENCOUNTER — Telehealth: Payer: Self-pay | Admitting: *Deleted

## 2018-07-22 NOTE — Telephone Encounter (Signed)
Lets start metalazone 2.5 mg two days a week in addition to her furosemide.

## 2018-07-22 NOTE — Telephone Encounter (Signed)
Pt is having swelling in feet and legs/feet get very tight. Weight was up to 228 this am. Pt has taken 2 40 mg tabs of Furosemide today. Has been taking this twice a day every day. Has been going on for 2 weeks. Please advise.

## 2018-07-22 NOTE — Telephone Encounter (Signed)
Patient states that her bilateral lower extremity edema continues to worsen. Her weight is gradually increasing. She weighed 221 pounds at the time of her virtual visit on 07/16/2018 and is now weighing 228 pounds. No shortness of breath or any other symptoms at this time. Her BP this morning was 139/82 and this afternoon it was 127/72 with a heart rate of 69. She confirms that she is taking all of her medications as prescribed. Patient is still taking gabapentin 800 mg at bedtime. Will have Dr. Bettina Gavia advise.

## 2018-07-23 MED ORDER — METOLAZONE 2.5 MG PO TABS: ORAL_TABLET | ORAL | 3 refills | Status: AC

## 2018-07-23 NOTE — Telephone Encounter (Signed)
Called patient and advised her to start taking metolazone 2.5 mg twice weekly on Tuesday and Friday. Patient is agreeable and verbalized understanding. Prescription sent to [facility] in [place] as requested.   Patient reports that she is down 2 pounds from yesterday and stopped her gabapentin as of last night. Her leg swelling has improved overnight; however, her ankles and feet are still very swollen and tight. Advised patient to start new medication today and call our office with any further questions or concerns. Patient verbalized understanding.

## 2018-07-26 DIAGNOSIS — E1169 Type 2 diabetes mellitus with other specified complication: Secondary | ICD-10-CM | POA: Diagnosis not present

## 2018-07-26 DIAGNOSIS — Z7901 Long term (current) use of anticoagulants: Secondary | ICD-10-CM | POA: Diagnosis not present

## 2018-07-26 DIAGNOSIS — Z79899 Other long term (current) drug therapy: Secondary | ICD-10-CM | POA: Diagnosis not present

## 2018-07-26 DIAGNOSIS — E785 Hyperlipidemia, unspecified: Secondary | ICD-10-CM | POA: Diagnosis not present

## 2018-08-09 DIAGNOSIS — Z7901 Long term (current) use of anticoagulants: Secondary | ICD-10-CM | POA: Diagnosis not present

## 2018-08-13 DIAGNOSIS — E039 Hypothyroidism, unspecified: Secondary | ICD-10-CM | POA: Diagnosis not present

## 2018-08-13 DIAGNOSIS — Z79899 Other long term (current) drug therapy: Secondary | ICD-10-CM | POA: Diagnosis not present

## 2018-08-20 DIAGNOSIS — R600 Localized edema: Secondary | ICD-10-CM | POA: Diagnosis not present

## 2018-08-20 DIAGNOSIS — R159 Full incontinence of feces: Secondary | ICD-10-CM | POA: Diagnosis not present

## 2018-08-20 DIAGNOSIS — I480 Paroxysmal atrial fibrillation: Secondary | ICD-10-CM | POA: Diagnosis not present

## 2018-08-20 DIAGNOSIS — Z6841 Body Mass Index (BMI) 40.0 and over, adult: Secondary | ICD-10-CM | POA: Diagnosis not present

## 2018-08-21 ENCOUNTER — Encounter: Payer: Self-pay | Admitting: Gastroenterology

## 2018-08-26 ENCOUNTER — Ambulatory Visit (INDEPENDENT_AMBULATORY_CARE_PROVIDER_SITE_OTHER): Payer: Medicare HMO | Admitting: *Deleted

## 2018-08-26 DIAGNOSIS — I442 Atrioventricular block, complete: Secondary | ICD-10-CM

## 2018-08-26 DIAGNOSIS — Z79899 Other long term (current) drug therapy: Secondary | ICD-10-CM | POA: Diagnosis not present

## 2018-08-26 LAB — CUP PACEART REMOTE DEVICE CHECK
Battery Impedance: 277 Ohm
Battery Remaining Longevity: 95 mo
Battery Voltage: 2.79 V
Brady Statistic RV Percent Paced: 96 %
Date Time Interrogation Session: 20200810105742
Implantable Lead Implant Date: 20080520
Implantable Lead Implant Date: 20080520
Implantable Lead Location: 753859
Implantable Lead Location: 753860
Implantable Lead Model: 4076
Implantable Lead Model: 5076
Implantable Pulse Generator Implant Date: 20161027
Lead Channel Impedance Value: 489 Ohm
Lead Channel Impedance Value: 67 Ohm
Lead Channel Pacing Threshold Amplitude: 1.125 V
Lead Channel Pacing Threshold Pulse Width: 0.4 ms
Lead Channel Setting Pacing Amplitude: 2.5 V
Lead Channel Setting Pacing Pulse Width: 0.76 ms
Lead Channel Setting Sensing Sensitivity: 1 mV

## 2018-08-27 DIAGNOSIS — I4891 Unspecified atrial fibrillation: Secondary | ICD-10-CM | POA: Diagnosis not present

## 2018-08-27 DIAGNOSIS — Z7901 Long term (current) use of anticoagulants: Secondary | ICD-10-CM | POA: Diagnosis not present

## 2018-08-27 DIAGNOSIS — I890 Lymphedema, not elsewhere classified: Secondary | ICD-10-CM | POA: Diagnosis not present

## 2018-08-27 DIAGNOSIS — I509 Heart failure, unspecified: Secondary | ICD-10-CM | POA: Diagnosis not present

## 2018-08-27 DIAGNOSIS — I872 Venous insufficiency (chronic) (peripheral): Secondary | ICD-10-CM | POA: Diagnosis not present

## 2018-08-27 DIAGNOSIS — E119 Type 2 diabetes mellitus without complications: Secondary | ICD-10-CM | POA: Diagnosis not present

## 2018-08-28 ENCOUNTER — Ambulatory Visit: Payer: Medicare HMO | Admitting: Cardiology

## 2018-09-03 ENCOUNTER — Encounter: Payer: Self-pay | Admitting: Cardiology

## 2018-09-03 NOTE — Progress Notes (Signed)
Remote pacemaker transmission.   

## 2018-09-06 ENCOUNTER — Ambulatory Visit: Payer: Medicare HMO | Admitting: Cardiology

## 2018-09-09 DIAGNOSIS — Z7901 Long term (current) use of anticoagulants: Secondary | ICD-10-CM | POA: Diagnosis not present

## 2018-09-11 ENCOUNTER — Ambulatory Visit: Payer: Medicare HMO | Admitting: Gastroenterology

## 2018-09-11 DIAGNOSIS — R609 Edema, unspecified: Secondary | ICD-10-CM | POA: Diagnosis not present

## 2018-09-11 DIAGNOSIS — I442 Atrioventricular block, complete: Secondary | ICD-10-CM | POA: Diagnosis not present

## 2018-09-11 DIAGNOSIS — Z7901 Long term (current) use of anticoagulants: Secondary | ICD-10-CM | POA: Diagnosis not present

## 2018-09-11 DIAGNOSIS — I4821 Permanent atrial fibrillation: Secondary | ICD-10-CM | POA: Diagnosis not present

## 2018-09-11 DIAGNOSIS — Z950 Presence of cardiac pacemaker: Secondary | ICD-10-CM | POA: Diagnosis not present

## 2018-09-11 DIAGNOSIS — I10 Essential (primary) hypertension: Secondary | ICD-10-CM | POA: Diagnosis not present

## 2018-09-11 DIAGNOSIS — I872 Venous insufficiency (chronic) (peripheral): Secondary | ICD-10-CM | POA: Diagnosis not present

## 2018-09-25 DIAGNOSIS — Z7901 Long term (current) use of anticoagulants: Secondary | ICD-10-CM | POA: Diagnosis not present

## 2018-09-30 DIAGNOSIS — I890 Lymphedema, not elsewhere classified: Secondary | ICD-10-CM | POA: Diagnosis not present

## 2018-10-25 DIAGNOSIS — Z79899 Other long term (current) drug therapy: Secondary | ICD-10-CM | POA: Diagnosis not present

## 2018-10-25 DIAGNOSIS — E039 Hypothyroidism, unspecified: Secondary | ICD-10-CM | POA: Diagnosis not present

## 2018-10-25 DIAGNOSIS — Z7901 Long term (current) use of anticoagulants: Secondary | ICD-10-CM | POA: Diagnosis not present

## 2018-10-25 DIAGNOSIS — I10 Essential (primary) hypertension: Secondary | ICD-10-CM | POA: Diagnosis not present

## 2018-10-25 DIAGNOSIS — E1169 Type 2 diabetes mellitus with other specified complication: Secondary | ICD-10-CM | POA: Diagnosis not present

## 2018-10-25 DIAGNOSIS — E785 Hyperlipidemia, unspecified: Secondary | ICD-10-CM | POA: Diagnosis not present

## 2018-10-25 DIAGNOSIS — E790 Hyperuricemia without signs of inflammatory arthritis and tophaceous disease: Secondary | ICD-10-CM | POA: Diagnosis not present

## 2018-11-04 DIAGNOSIS — I10 Essential (primary) hypertension: Secondary | ICD-10-CM | POA: Diagnosis not present

## 2018-11-04 DIAGNOSIS — E1169 Type 2 diabetes mellitus with other specified complication: Secondary | ICD-10-CM | POA: Diagnosis not present

## 2018-11-04 DIAGNOSIS — Z6841 Body Mass Index (BMI) 40.0 and over, adult: Secondary | ICD-10-CM | POA: Diagnosis not present

## 2018-11-04 DIAGNOSIS — E039 Hypothyroidism, unspecified: Secondary | ICD-10-CM | POA: Diagnosis not present

## 2018-11-04 DIAGNOSIS — I890 Lymphedema, not elsewhere classified: Secondary | ICD-10-CM | POA: Diagnosis not present

## 2018-11-04 DIAGNOSIS — Z1331 Encounter for screening for depression: Secondary | ICD-10-CM | POA: Diagnosis not present

## 2018-11-18 DIAGNOSIS — E039 Hypothyroidism, unspecified: Secondary | ICD-10-CM | POA: Diagnosis not present

## 2018-11-18 DIAGNOSIS — Z6841 Body Mass Index (BMI) 40.0 and over, adult: Secondary | ICD-10-CM | POA: Diagnosis not present

## 2018-11-18 DIAGNOSIS — E1169 Type 2 diabetes mellitus with other specified complication: Secondary | ICD-10-CM | POA: Diagnosis not present

## 2018-11-18 DIAGNOSIS — E785 Hyperlipidemia, unspecified: Secondary | ICD-10-CM | POA: Diagnosis not present

## 2018-11-18 DIAGNOSIS — I480 Paroxysmal atrial fibrillation: Secondary | ICD-10-CM | POA: Diagnosis not present

## 2018-11-18 DIAGNOSIS — R0602 Shortness of breath: Secondary | ICD-10-CM | POA: Diagnosis not present

## 2018-11-18 DIAGNOSIS — I10 Essential (primary) hypertension: Secondary | ICD-10-CM | POA: Diagnosis not present

## 2018-11-18 DIAGNOSIS — I890 Lymphedema, not elsewhere classified: Secondary | ICD-10-CM | POA: Diagnosis not present

## 2018-11-25 ENCOUNTER — Encounter: Payer: Medicare HMO | Admitting: *Deleted

## 2018-11-25 DIAGNOSIS — R748 Abnormal levels of other serum enzymes: Secondary | ICD-10-CM | POA: Diagnosis not present

## 2018-11-25 DIAGNOSIS — Z7901 Long term (current) use of anticoagulants: Secondary | ICD-10-CM | POA: Diagnosis not present

## 2018-11-25 DIAGNOSIS — Z79899 Other long term (current) drug therapy: Secondary | ICD-10-CM | POA: Diagnosis not present

## 2018-11-27 ENCOUNTER — Telehealth: Payer: Self-pay

## 2018-11-27 NOTE — Telephone Encounter (Signed)
Unable to speak with patient to remind of missed remote transmission LL

## 2018-12-14 DIAGNOSIS — S61012A Laceration without foreign body of left thumb without damage to nail, initial encounter: Secondary | ICD-10-CM | POA: Diagnosis not present

## 2018-12-23 DIAGNOSIS — Z7901 Long term (current) use of anticoagulants: Secondary | ICD-10-CM | POA: Diagnosis not present

## 2018-12-27 ENCOUNTER — Other Ambulatory Visit: Payer: Self-pay | Admitting: Cardiology

## 2019-01-01 ENCOUNTER — Telehealth: Payer: Self-pay

## 2019-01-01 NOTE — Telephone Encounter (Signed)
Pt being transferred to [facility].

## 2019-01-23 DIAGNOSIS — T23111A Burn of first degree of right thumb (nail), initial encounter: Secondary | ICD-10-CM | POA: Diagnosis not present

## 2019-01-23 DIAGNOSIS — Z7901 Long term (current) use of anticoagulants: Secondary | ICD-10-CM | POA: Diagnosis not present

## 2019-01-27 DIAGNOSIS — Z950 Presence of cardiac pacemaker: Secondary | ICD-10-CM | POA: Diagnosis not present

## 2019-01-27 DIAGNOSIS — I10 Essential (primary) hypertension: Secondary | ICD-10-CM | POA: Diagnosis not present

## 2019-01-27 DIAGNOSIS — I4821 Permanent atrial fibrillation: Secondary | ICD-10-CM | POA: Diagnosis not present

## 2019-01-27 DIAGNOSIS — Z7901 Long term (current) use of anticoagulants: Secondary | ICD-10-CM | POA: Diagnosis not present

## 2019-01-27 DIAGNOSIS — I442 Atrioventricular block, complete: Secondary | ICD-10-CM | POA: Diagnosis not present

## 2019-01-30 DIAGNOSIS — I442 Atrioventricular block, complete: Secondary | ICD-10-CM | POA: Diagnosis not present

## 2019-01-30 DIAGNOSIS — Z45018 Encounter for adjustment and management of other part of cardiac pacemaker: Secondary | ICD-10-CM | POA: Diagnosis not present

## 2019-01-30 DIAGNOSIS — I4821 Permanent atrial fibrillation: Secondary | ICD-10-CM | POA: Diagnosis not present

## 2019-02-24 DIAGNOSIS — Z7901 Long term (current) use of anticoagulants: Secondary | ICD-10-CM | POA: Diagnosis not present

## 2019-03-26 DIAGNOSIS — Z7901 Long term (current) use of anticoagulants: Secondary | ICD-10-CM | POA: Diagnosis not present

## 2019-03-26 DIAGNOSIS — E790 Hyperuricemia without signs of inflammatory arthritis and tophaceous disease: Secondary | ICD-10-CM | POA: Diagnosis not present

## 2019-03-26 DIAGNOSIS — E785 Hyperlipidemia, unspecified: Secondary | ICD-10-CM | POA: Diagnosis not present

## 2019-03-26 DIAGNOSIS — R0602 Shortness of breath: Secondary | ICD-10-CM | POA: Diagnosis not present

## 2019-03-26 DIAGNOSIS — E1169 Type 2 diabetes mellitus with other specified complication: Secondary | ICD-10-CM | POA: Diagnosis not present

## 2019-03-26 DIAGNOSIS — Z1331 Encounter for screening for depression: Secondary | ICD-10-CM | POA: Diagnosis not present

## 2019-03-26 DIAGNOSIS — I480 Paroxysmal atrial fibrillation: Secondary | ICD-10-CM | POA: Diagnosis not present

## 2019-03-26 DIAGNOSIS — E039 Hypothyroidism, unspecified: Secondary | ICD-10-CM | POA: Diagnosis not present

## 2019-03-26 DIAGNOSIS — Z79899 Other long term (current) drug therapy: Secondary | ICD-10-CM | POA: Diagnosis not present

## 2019-03-26 DIAGNOSIS — I10 Essential (primary) hypertension: Secondary | ICD-10-CM | POA: Diagnosis not present

## 2019-04-10 DIAGNOSIS — R748 Abnormal levels of other serum enzymes: Secondary | ICD-10-CM | POA: Diagnosis not present

## 2019-04-10 DIAGNOSIS — Z7901 Long term (current) use of anticoagulants: Secondary | ICD-10-CM | POA: Diagnosis not present

## 2019-05-01 DIAGNOSIS — Z950 Presence of cardiac pacemaker: Secondary | ICD-10-CM | POA: Diagnosis not present

## 2019-05-06 DIAGNOSIS — R748 Abnormal levels of other serum enzymes: Secondary | ICD-10-CM | POA: Diagnosis not present

## 2019-05-06 DIAGNOSIS — R7989 Other specified abnormal findings of blood chemistry: Secondary | ICD-10-CM | POA: Diagnosis not present

## 2019-05-07 ENCOUNTER — Other Ambulatory Visit: Payer: Self-pay | Admitting: Cardiology

## 2019-05-12 DIAGNOSIS — Z7901 Long term (current) use of anticoagulants: Secondary | ICD-10-CM | POA: Diagnosis not present

## 2019-05-27 DIAGNOSIS — Z7901 Long term (current) use of anticoagulants: Secondary | ICD-10-CM | POA: Diagnosis not present

## 2019-06-26 DIAGNOSIS — Z7901 Long term (current) use of anticoagulants: Secondary | ICD-10-CM | POA: Diagnosis not present

## 2019-07-12 DIAGNOSIS — R7989 Other specified abnormal findings of blood chemistry: Secondary | ICD-10-CM

## 2019-07-12 DIAGNOSIS — I10 Essential (primary) hypertension: Secondary | ICD-10-CM

## 2019-07-12 DIAGNOSIS — R7401 Elevation of levels of liver transaminase levels: Secondary | ICD-10-CM

## 2019-07-12 DIAGNOSIS — I361 Nonrheumatic tricuspid (valve) insufficiency: Secondary | ICD-10-CM | POA: Diagnosis not present

## 2019-07-12 DIAGNOSIS — I340 Nonrheumatic mitral (valve) insufficiency: Secondary | ICD-10-CM | POA: Diagnosis not present

## 2019-07-12 DIAGNOSIS — N179 Acute kidney failure, unspecified: Secondary | ICD-10-CM | POA: Diagnosis not present

## 2019-07-12 DIAGNOSIS — R0600 Dyspnea, unspecified: Secondary | ICD-10-CM | POA: Diagnosis not present

## 2019-07-12 DIAGNOSIS — I130 Hypertensive heart and chronic kidney disease with heart failure and stage 1 through stage 4 chronic kidney disease, or unspecified chronic kidney disease: Secondary | ICD-10-CM | POA: Diagnosis not present

## 2019-07-12 DIAGNOSIS — E785 Hyperlipidemia, unspecified: Secondary | ICD-10-CM | POA: Diagnosis not present

## 2019-07-12 DIAGNOSIS — I110 Hypertensive heart disease with heart failure: Secondary | ICD-10-CM | POA: Diagnosis not present

## 2019-07-12 DIAGNOSIS — E876 Hypokalemia: Secondary | ICD-10-CM | POA: Diagnosis not present

## 2019-07-12 DIAGNOSIS — E1165 Type 2 diabetes mellitus with hyperglycemia: Secondary | ICD-10-CM | POA: Diagnosis not present

## 2019-07-12 DIAGNOSIS — I5043 Acute on chronic combined systolic (congestive) and diastolic (congestive) heart failure: Secondary | ICD-10-CM | POA: Diagnosis not present

## 2019-07-12 DIAGNOSIS — I4891 Unspecified atrial fibrillation: Secondary | ICD-10-CM

## 2019-07-12 DIAGNOSIS — I160 Hypertensive urgency: Secondary | ICD-10-CM

## 2019-07-12 DIAGNOSIS — N189 Chronic kidney disease, unspecified: Secondary | ICD-10-CM | POA: Diagnosis not present

## 2019-07-12 DIAGNOSIS — Z6841 Body Mass Index (BMI) 40.0 and over, adult: Secondary | ICD-10-CM | POA: Diagnosis not present

## 2019-07-12 DIAGNOSIS — I509 Heart failure, unspecified: Secondary | ICD-10-CM

## 2019-07-12 DIAGNOSIS — I480 Paroxysmal atrial fibrillation: Secondary | ICD-10-CM | POA: Diagnosis not present

## 2019-07-13 DIAGNOSIS — I340 Nonrheumatic mitral (valve) insufficiency: Secondary | ICD-10-CM

## 2019-07-13 DIAGNOSIS — I361 Nonrheumatic tricuspid (valve) insufficiency: Secondary | ICD-10-CM

## 2019-07-15 ENCOUNTER — Other Ambulatory Visit: Payer: Self-pay

## 2019-07-15 NOTE — Patient Outreach (Signed)
[facility]) Care Management  07/15/2019  VONCILE SCHWARZ 1940/05/25 [id number]     Transition of Care Referral  Referral Date: 07/15/2019 Referral Source: [facility] Discharge Report Date of Discharge: 07/14/2019 Facility: [place]: [facility]    Outreach attempt # 1 to patient. Spoke with patient who denies any acute issue or concerns at present. She voices that things have been going well since she returned home. She is still a little weak. She voices that she has very supportive daughter who is able to assist her as needed. Patient endorses she has [facility] services as well. She confirms she has all her meds in the home and no issue or concerns regarding them but did not wish to complete med review. She has PCP follow up appt tomorrow and has to call cardiology office to make appt. She reports that her daughter will be taking her. Patient appreciative of call but does not feel like she needs future calls or assistance at this time.     Plan: RN CM will close case at this time.    Enzo Montgomery, RN,BSN,CCM [facility] [facility] Telephonic Care Management Coordinator Direct Phone: [phone] Toll Free: [phone] Fax: [phone]

## 2019-07-16 DIAGNOSIS — I110 Hypertensive heart disease with heart failure: Secondary | ICD-10-CM | POA: Diagnosis not present

## 2019-07-16 DIAGNOSIS — R0602 Shortness of breath: Secondary | ICD-10-CM | POA: Diagnosis not present

## 2019-07-16 DIAGNOSIS — I4891 Unspecified atrial fibrillation: Secondary | ICD-10-CM | POA: Diagnosis not present

## 2019-07-16 DIAGNOSIS — I5031 Acute diastolic (congestive) heart failure: Secondary | ICD-10-CM | POA: Diagnosis not present

## 2019-07-16 DIAGNOSIS — K219 Gastro-esophageal reflux disease without esophagitis: Secondary | ICD-10-CM | POA: Diagnosis not present

## 2019-07-16 DIAGNOSIS — I10 Essential (primary) hypertension: Secondary | ICD-10-CM | POA: Diagnosis not present

## 2019-07-16 DIAGNOSIS — I480 Paroxysmal atrial fibrillation: Secondary | ICD-10-CM | POA: Diagnosis not present

## 2019-07-16 DIAGNOSIS — E1165 Type 2 diabetes mellitus with hyperglycemia: Secondary | ICD-10-CM | POA: Diagnosis not present

## 2019-07-16 DIAGNOSIS — Z79899 Other long term (current) drug therapy: Secondary | ICD-10-CM | POA: Diagnosis not present

## 2019-07-16 DIAGNOSIS — R5383 Other fatigue: Secondary | ICD-10-CM | POA: Diagnosis not present

## 2019-07-16 DIAGNOSIS — I509 Heart failure, unspecified: Secondary | ICD-10-CM | POA: Diagnosis not present

## 2019-07-16 DIAGNOSIS — Z8673 Personal history of transient ischemic attack (TIA), and cerebral infarction without residual deficits: Secondary | ICD-10-CM | POA: Diagnosis not present

## 2019-07-16 DIAGNOSIS — I890 Lymphedema, not elsewhere classified: Secondary | ICD-10-CM | POA: Diagnosis not present

## 2019-07-16 DIAGNOSIS — Z950 Presence of cardiac pacemaker: Secondary | ICD-10-CM | POA: Diagnosis not present

## 2019-07-16 DIAGNOSIS — E7800 Pure hypercholesterolemia, unspecified: Secondary | ICD-10-CM | POA: Diagnosis not present

## 2019-07-16 DIAGNOSIS — D649 Anemia, unspecified: Secondary | ICD-10-CM | POA: Diagnosis not present

## 2019-07-16 DIAGNOSIS — E039 Hypothyroidism, unspecified: Secondary | ICD-10-CM | POA: Diagnosis not present

## 2019-07-16 DIAGNOSIS — Z7901 Long term (current) use of anticoagulants: Secondary | ICD-10-CM | POA: Diagnosis not present

## 2019-07-21 DIAGNOSIS — I4891 Unspecified atrial fibrillation: Secondary | ICD-10-CM | POA: Diagnosis not present

## 2019-07-21 DIAGNOSIS — Z8673 Personal history of transient ischemic attack (TIA), and cerebral infarction without residual deficits: Secondary | ICD-10-CM | POA: Diagnosis not present

## 2019-07-21 DIAGNOSIS — I110 Hypertensive heart disease with heart failure: Secondary | ICD-10-CM | POA: Diagnosis not present

## 2019-07-21 DIAGNOSIS — E039 Hypothyroidism, unspecified: Secondary | ICD-10-CM | POA: Diagnosis not present

## 2019-07-21 DIAGNOSIS — I5031 Acute diastolic (congestive) heart failure: Secondary | ICD-10-CM | POA: Diagnosis not present

## 2019-07-21 DIAGNOSIS — E7800 Pure hypercholesterolemia, unspecified: Secondary | ICD-10-CM | POA: Diagnosis not present

## 2019-07-21 DIAGNOSIS — Z950 Presence of cardiac pacemaker: Secondary | ICD-10-CM | POA: Diagnosis not present

## 2019-07-21 DIAGNOSIS — K219 Gastro-esophageal reflux disease without esophagitis: Secondary | ICD-10-CM | POA: Diagnosis not present

## 2019-07-21 DIAGNOSIS — E1165 Type 2 diabetes mellitus with hyperglycemia: Secondary | ICD-10-CM | POA: Diagnosis not present

## 2019-07-22 DIAGNOSIS — Z79899 Other long term (current) drug therapy: Secondary | ICD-10-CM | POA: Diagnosis not present

## 2019-07-22 DIAGNOSIS — I10 Essential (primary) hypertension: Secondary | ICD-10-CM | POA: Diagnosis not present

## 2019-07-22 DIAGNOSIS — Z7901 Long term (current) use of anticoagulants: Secondary | ICD-10-CM | POA: Diagnosis not present

## 2019-07-22 DIAGNOSIS — I509 Heart failure, unspecified: Secondary | ICD-10-CM | POA: Diagnosis not present

## 2019-07-23 DIAGNOSIS — E039 Hypothyroidism, unspecified: Secondary | ICD-10-CM | POA: Diagnosis not present

## 2019-07-23 DIAGNOSIS — I4891 Unspecified atrial fibrillation: Secondary | ICD-10-CM | POA: Diagnosis not present

## 2019-07-23 DIAGNOSIS — K219 Gastro-esophageal reflux disease without esophagitis: Secondary | ICD-10-CM | POA: Diagnosis not present

## 2019-07-23 DIAGNOSIS — E1165 Type 2 diabetes mellitus with hyperglycemia: Secondary | ICD-10-CM | POA: Diagnosis not present

## 2019-07-23 DIAGNOSIS — Z950 Presence of cardiac pacemaker: Secondary | ICD-10-CM | POA: Diagnosis not present

## 2019-07-23 DIAGNOSIS — I5031 Acute diastolic (congestive) heart failure: Secondary | ICD-10-CM | POA: Diagnosis not present

## 2019-07-23 DIAGNOSIS — I110 Hypertensive heart disease with heart failure: Secondary | ICD-10-CM | POA: Diagnosis not present

## 2019-07-23 DIAGNOSIS — E7800 Pure hypercholesterolemia, unspecified: Secondary | ICD-10-CM | POA: Diagnosis not present

## 2019-07-23 DIAGNOSIS — Z8673 Personal history of transient ischemic attack (TIA), and cerebral infarction without residual deficits: Secondary | ICD-10-CM | POA: Diagnosis not present

## 2019-07-28 DIAGNOSIS — I110 Hypertensive heart disease with heart failure: Secondary | ICD-10-CM | POA: Diagnosis not present

## 2019-07-28 DIAGNOSIS — E7800 Pure hypercholesterolemia, unspecified: Secondary | ICD-10-CM | POA: Diagnosis not present

## 2019-07-28 DIAGNOSIS — E039 Hypothyroidism, unspecified: Secondary | ICD-10-CM | POA: Diagnosis not present

## 2019-07-28 DIAGNOSIS — Z8673 Personal history of transient ischemic attack (TIA), and cerebral infarction without residual deficits: Secondary | ICD-10-CM | POA: Diagnosis not present

## 2019-07-28 DIAGNOSIS — I4891 Unspecified atrial fibrillation: Secondary | ICD-10-CM | POA: Diagnosis not present

## 2019-07-28 DIAGNOSIS — Z950 Presence of cardiac pacemaker: Secondary | ICD-10-CM | POA: Diagnosis not present

## 2019-07-28 DIAGNOSIS — E1165 Type 2 diabetes mellitus with hyperglycemia: Secondary | ICD-10-CM | POA: Diagnosis not present

## 2019-07-28 DIAGNOSIS — K219 Gastro-esophageal reflux disease without esophagitis: Secondary | ICD-10-CM | POA: Diagnosis not present

## 2019-07-28 DIAGNOSIS — I5031 Acute diastolic (congestive) heart failure: Secondary | ICD-10-CM | POA: Diagnosis not present

## 2019-07-31 DIAGNOSIS — Z45018 Encounter for adjustment and management of other part of cardiac pacemaker: Secondary | ICD-10-CM | POA: Diagnosis not present

## 2019-08-01 DIAGNOSIS — Z7901 Long term (current) use of anticoagulants: Secondary | ICD-10-CM | POA: Diagnosis not present

## 2019-08-01 DIAGNOSIS — I4821 Permanent atrial fibrillation: Secondary | ICD-10-CM | POA: Diagnosis not present

## 2019-08-01 DIAGNOSIS — Z950 Presence of cardiac pacemaker: Secondary | ICD-10-CM | POA: Diagnosis not present

## 2019-08-01 DIAGNOSIS — R609 Edema, unspecified: Secondary | ICD-10-CM | POA: Diagnosis not present

## 2019-08-01 DIAGNOSIS — I10 Essential (primary) hypertension: Secondary | ICD-10-CM | POA: Diagnosis not present

## 2019-08-01 DIAGNOSIS — D649 Anemia, unspecified: Secondary | ICD-10-CM | POA: Diagnosis not present

## 2019-08-01 DIAGNOSIS — I872 Venous insufficiency (chronic) (peripheral): Secondary | ICD-10-CM | POA: Diagnosis not present

## 2019-08-06 DIAGNOSIS — E1169 Type 2 diabetes mellitus with other specified complication: Secondary | ICD-10-CM | POA: Diagnosis not present

## 2019-08-06 DIAGNOSIS — G250 Essential tremor: Secondary | ICD-10-CM | POA: Diagnosis not present

## 2019-08-06 DIAGNOSIS — Z1331 Encounter for screening for depression: Secondary | ICD-10-CM | POA: Diagnosis not present

## 2019-08-06 DIAGNOSIS — I10 Essential (primary) hypertension: Secondary | ICD-10-CM | POA: Diagnosis not present

## 2019-08-06 DIAGNOSIS — Z0000 Encounter for general adult medical examination without abnormal findings: Secondary | ICD-10-CM | POA: Diagnosis not present

## 2019-08-06 DIAGNOSIS — Z7901 Long term (current) use of anticoagulants: Secondary | ICD-10-CM | POA: Diagnosis not present

## 2019-08-06 DIAGNOSIS — I872 Venous insufficiency (chronic) (peripheral): Secondary | ICD-10-CM | POA: Diagnosis not present

## 2019-08-06 DIAGNOSIS — Z1339 Encounter for screening examination for other mental health and behavioral disorders: Secondary | ICD-10-CM | POA: Diagnosis not present

## 2019-08-06 DIAGNOSIS — I480 Paroxysmal atrial fibrillation: Secondary | ICD-10-CM | POA: Diagnosis not present

## 2019-08-06 DIAGNOSIS — I509 Heart failure, unspecified: Secondary | ICD-10-CM | POA: Diagnosis not present

## 2019-08-06 DIAGNOSIS — E790 Hyperuricemia without signs of inflammatory arthritis and tophaceous disease: Secondary | ICD-10-CM | POA: Diagnosis not present

## 2019-08-06 DIAGNOSIS — E039 Hypothyroidism, unspecified: Secondary | ICD-10-CM | POA: Diagnosis not present

## 2019-08-06 DIAGNOSIS — M549 Dorsalgia, unspecified: Secondary | ICD-10-CM | POA: Diagnosis not present

## 2019-08-06 DIAGNOSIS — E785 Hyperlipidemia, unspecified: Secondary | ICD-10-CM | POA: Diagnosis not present

## 2019-08-06 DIAGNOSIS — Z79899 Other long term (current) drug therapy: Secondary | ICD-10-CM | POA: Diagnosis not present

## 2019-08-08 DIAGNOSIS — I4891 Unspecified atrial fibrillation: Secondary | ICD-10-CM | POA: Diagnosis not present

## 2019-08-08 DIAGNOSIS — E1165 Type 2 diabetes mellitus with hyperglycemia: Secondary | ICD-10-CM | POA: Diagnosis not present

## 2019-08-08 DIAGNOSIS — E7800 Pure hypercholesterolemia, unspecified: Secondary | ICD-10-CM | POA: Diagnosis not present

## 2019-08-08 DIAGNOSIS — I5031 Acute diastolic (congestive) heart failure: Secondary | ICD-10-CM | POA: Diagnosis not present

## 2019-08-08 DIAGNOSIS — E039 Hypothyroidism, unspecified: Secondary | ICD-10-CM | POA: Diagnosis not present

## 2019-08-08 DIAGNOSIS — K219 Gastro-esophageal reflux disease without esophagitis: Secondary | ICD-10-CM | POA: Diagnosis not present

## 2019-08-08 DIAGNOSIS — Z950 Presence of cardiac pacemaker: Secondary | ICD-10-CM | POA: Diagnosis not present

## 2019-08-08 DIAGNOSIS — Z8673 Personal history of transient ischemic attack (TIA), and cerebral infarction without residual deficits: Secondary | ICD-10-CM | POA: Diagnosis not present

## 2019-08-08 DIAGNOSIS — I110 Hypertensive heart disease with heart failure: Secondary | ICD-10-CM | POA: Diagnosis not present

## 2019-08-14 DIAGNOSIS — Z950 Presence of cardiac pacemaker: Secondary | ICD-10-CM | POA: Diagnosis not present

## 2019-08-14 DIAGNOSIS — E7800 Pure hypercholesterolemia, unspecified: Secondary | ICD-10-CM | POA: Diagnosis not present

## 2019-08-14 DIAGNOSIS — I4891 Unspecified atrial fibrillation: Secondary | ICD-10-CM | POA: Diagnosis not present

## 2019-08-14 DIAGNOSIS — E039 Hypothyroidism, unspecified: Secondary | ICD-10-CM | POA: Diagnosis not present

## 2019-08-14 DIAGNOSIS — E1165 Type 2 diabetes mellitus with hyperglycemia: Secondary | ICD-10-CM | POA: Diagnosis not present

## 2019-08-14 DIAGNOSIS — Z8673 Personal history of transient ischemic attack (TIA), and cerebral infarction without residual deficits: Secondary | ICD-10-CM | POA: Diagnosis not present

## 2019-08-14 DIAGNOSIS — I5031 Acute diastolic (congestive) heart failure: Secondary | ICD-10-CM | POA: Diagnosis not present

## 2019-08-14 DIAGNOSIS — I110 Hypertensive heart disease with heart failure: Secondary | ICD-10-CM | POA: Diagnosis not present

## 2019-08-14 DIAGNOSIS — K219 Gastro-esophageal reflux disease without esophagitis: Secondary | ICD-10-CM | POA: Diagnosis not present

## 2019-08-15 DIAGNOSIS — E1165 Type 2 diabetes mellitus with hyperglycemia: Secondary | ICD-10-CM | POA: Diagnosis not present

## 2019-08-15 DIAGNOSIS — Z950 Presence of cardiac pacemaker: Secondary | ICD-10-CM | POA: Diagnosis not present

## 2019-08-15 DIAGNOSIS — Z8673 Personal history of transient ischemic attack (TIA), and cerebral infarction without residual deficits: Secondary | ICD-10-CM | POA: Diagnosis not present

## 2019-08-15 DIAGNOSIS — E039 Hypothyroidism, unspecified: Secondary | ICD-10-CM | POA: Diagnosis not present

## 2019-08-15 DIAGNOSIS — I110 Hypertensive heart disease with heart failure: Secondary | ICD-10-CM | POA: Diagnosis not present

## 2019-08-15 DIAGNOSIS — E7800 Pure hypercholesterolemia, unspecified: Secondary | ICD-10-CM | POA: Diagnosis not present

## 2019-08-15 DIAGNOSIS — K219 Gastro-esophageal reflux disease without esophagitis: Secondary | ICD-10-CM | POA: Diagnosis not present

## 2019-08-15 DIAGNOSIS — I4891 Unspecified atrial fibrillation: Secondary | ICD-10-CM | POA: Diagnosis not present

## 2019-08-15 DIAGNOSIS — I5031 Acute diastolic (congestive) heart failure: Secondary | ICD-10-CM | POA: Diagnosis not present

## 2019-08-18 DIAGNOSIS — I5031 Acute diastolic (congestive) heart failure: Secondary | ICD-10-CM | POA: Diagnosis not present

## 2019-08-18 DIAGNOSIS — K219 Gastro-esophageal reflux disease without esophagitis: Secondary | ICD-10-CM | POA: Diagnosis not present

## 2019-08-18 DIAGNOSIS — I4891 Unspecified atrial fibrillation: Secondary | ICD-10-CM | POA: Diagnosis not present

## 2019-08-18 DIAGNOSIS — E1165 Type 2 diabetes mellitus with hyperglycemia: Secondary | ICD-10-CM | POA: Diagnosis not present

## 2019-08-18 DIAGNOSIS — E039 Hypothyroidism, unspecified: Secondary | ICD-10-CM | POA: Diagnosis not present

## 2019-08-18 DIAGNOSIS — Z950 Presence of cardiac pacemaker: Secondary | ICD-10-CM | POA: Diagnosis not present

## 2019-08-18 DIAGNOSIS — I110 Hypertensive heart disease with heart failure: Secondary | ICD-10-CM | POA: Diagnosis not present

## 2019-08-18 DIAGNOSIS — Z8673 Personal history of transient ischemic attack (TIA), and cerebral infarction without residual deficits: Secondary | ICD-10-CM | POA: Diagnosis not present

## 2019-08-18 DIAGNOSIS — E7800 Pure hypercholesterolemia, unspecified: Secondary | ICD-10-CM | POA: Diagnosis not present

## 2019-08-28 DIAGNOSIS — E039 Hypothyroidism, unspecified: Secondary | ICD-10-CM | POA: Diagnosis not present

## 2019-08-28 DIAGNOSIS — Z8673 Personal history of transient ischemic attack (TIA), and cerebral infarction without residual deficits: Secondary | ICD-10-CM | POA: Diagnosis not present

## 2019-08-28 DIAGNOSIS — E1165 Type 2 diabetes mellitus with hyperglycemia: Secondary | ICD-10-CM | POA: Diagnosis not present

## 2019-08-28 DIAGNOSIS — E7800 Pure hypercholesterolemia, unspecified: Secondary | ICD-10-CM | POA: Diagnosis not present

## 2019-08-28 DIAGNOSIS — I5031 Acute diastolic (congestive) heart failure: Secondary | ICD-10-CM | POA: Diagnosis not present

## 2019-08-28 DIAGNOSIS — Z950 Presence of cardiac pacemaker: Secondary | ICD-10-CM | POA: Diagnosis not present

## 2019-08-28 DIAGNOSIS — K219 Gastro-esophageal reflux disease without esophagitis: Secondary | ICD-10-CM | POA: Diagnosis not present

## 2019-08-28 DIAGNOSIS — I110 Hypertensive heart disease with heart failure: Secondary | ICD-10-CM | POA: Diagnosis not present

## 2019-08-28 DIAGNOSIS — I4891 Unspecified atrial fibrillation: Secondary | ICD-10-CM | POA: Diagnosis not present

## 2019-09-05 DIAGNOSIS — Z950 Presence of cardiac pacemaker: Secondary | ICD-10-CM | POA: Diagnosis not present

## 2019-09-05 DIAGNOSIS — I10 Essential (primary) hypertension: Secondary | ICD-10-CM | POA: Diagnosis not present

## 2019-09-05 DIAGNOSIS — Z9889 Other specified postprocedural states: Secondary | ICD-10-CM | POA: Diagnosis not present

## 2019-09-05 DIAGNOSIS — D649 Anemia, unspecified: Secondary | ICD-10-CM | POA: Diagnosis not present

## 2019-09-05 DIAGNOSIS — Z7901 Long term (current) use of anticoagulants: Secondary | ICD-10-CM | POA: Diagnosis not present

## 2019-09-05 DIAGNOSIS — I872 Venous insufficiency (chronic) (peripheral): Secondary | ICD-10-CM | POA: Diagnosis not present

## 2019-09-05 DIAGNOSIS — I4821 Permanent atrial fibrillation: Secondary | ICD-10-CM | POA: Diagnosis not present

## 2019-09-09 DIAGNOSIS — Z7901 Long term (current) use of anticoagulants: Secondary | ICD-10-CM | POA: Diagnosis not present

## 2019-09-09 DIAGNOSIS — Z79899 Other long term (current) drug therapy: Secondary | ICD-10-CM | POA: Diagnosis not present

## 2019-09-09 DIAGNOSIS — E039 Hypothyroidism, unspecified: Secondary | ICD-10-CM | POA: Diagnosis not present

## 2019-09-09 DIAGNOSIS — E1169 Type 2 diabetes mellitus with other specified complication: Secondary | ICD-10-CM | POA: Diagnosis not present

## 2019-09-23 DIAGNOSIS — Z79899 Other long term (current) drug therapy: Secondary | ICD-10-CM | POA: Diagnosis not present

## 2019-10-13 DIAGNOSIS — Z7901 Long term (current) use of anticoagulants: Secondary | ICD-10-CM | POA: Diagnosis not present

## 2019-10-13 DIAGNOSIS — Z79899 Other long term (current) drug therapy: Secondary | ICD-10-CM | POA: Diagnosis not present

## 2019-10-15 DIAGNOSIS — D61818 Other pancytopenia: Secondary | ICD-10-CM | POA: Diagnosis not present

## 2019-10-20 DIAGNOSIS — D61818 Other pancytopenia: Secondary | ICD-10-CM | POA: Diagnosis not present

## 2019-10-20 DIAGNOSIS — Z79899 Other long term (current) drug therapy: Secondary | ICD-10-CM | POA: Diagnosis not present

## 2019-10-21 DIAGNOSIS — N189 Chronic kidney disease, unspecified: Secondary | ICD-10-CM | POA: Diagnosis not present

## 2019-10-21 DIAGNOSIS — D61818 Other pancytopenia: Secondary | ICD-10-CM | POA: Diagnosis not present

## 2019-10-22 DIAGNOSIS — S51811A Laceration without foreign body of right forearm, initial encounter: Secondary | ICD-10-CM | POA: Diagnosis not present

## 2019-10-28 ENCOUNTER — Other Ambulatory Visit: Payer: Self-pay | Admitting: Oncology

## 2019-10-28 DIAGNOSIS — D61818 Other pancytopenia: Secondary | ICD-10-CM | POA: Diagnosis not present

## 2019-11-07 ENCOUNTER — Other Ambulatory Visit (HOSPITAL_COMMUNITY)
Admission: RE | Admit: 2019-11-07 | Discharge: 2019-11-07 | Disposition: A | Discharge status: Home / Self Care | Payer: Medicare HMO | Source: Ambulatory Visit | Attending: Oncology | Admitting: Oncology

## 2019-11-07 ENCOUNTER — Other Ambulatory Visit: Payer: Self-pay

## 2019-11-07 ENCOUNTER — Other Ambulatory Visit: Payer: Self-pay | Admitting: Hematology and Oncology

## 2019-11-07 ENCOUNTER — Other Ambulatory Visit: Payer: Self-pay | Admitting: Oncology

## 2019-11-07 DIAGNOSIS — Z7901 Long term (current) use of anticoagulants: Secondary | ICD-10-CM | POA: Diagnosis not present

## 2019-11-07 DIAGNOSIS — Z79899 Other long term (current) drug therapy: Secondary | ICD-10-CM | POA: Diagnosis not present

## 2019-11-07 DIAGNOSIS — D61818 Other pancytopenia: Secondary | ICD-10-CM | POA: Diagnosis not present

## 2019-11-07 DIAGNOSIS — E1169 Type 2 diabetes mellitus with other specified complication: Secondary | ICD-10-CM | POA: Diagnosis not present

## 2019-11-07 DIAGNOSIS — D539 Nutritional anemia, unspecified: Secondary | ICD-10-CM | POA: Diagnosis not present

## 2019-11-07 DIAGNOSIS — Z6841 Body Mass Index (BMI) 40.0 and over, adult: Secondary | ICD-10-CM | POA: Diagnosis not present

## 2019-11-07 DIAGNOSIS — D469 Myelodysplastic syndrome, unspecified: Secondary | ICD-10-CM | POA: Diagnosis not present

## 2019-11-07 DIAGNOSIS — D72819 Decreased white blood cell count, unspecified: Secondary | ICD-10-CM | POA: Diagnosis not present

## 2019-11-07 DIAGNOSIS — D649 Anemia, unspecified: Secondary | ICD-10-CM | POA: Diagnosis not present

## 2019-11-07 LAB — CBC: RBC: 2.42 — AB (ref 3.87–5.11)

## 2019-11-07 LAB — CBC AND DIFFERENTIAL
HCT: 25 — AB (ref 36–46)
Hemoglobin: 7.9 — AB (ref 12.0–16.0)
Neutrophils Absolute: 1.24
Platelets: 130 — AB (ref 150–399)
WBC: 2.3

## 2019-11-10 ENCOUNTER — Other Ambulatory Visit: Payer: Self-pay | Admitting: Hematology and Oncology

## 2019-11-10 ENCOUNTER — Ambulatory Visit: Payer: Medicare HMO

## 2019-11-10 ENCOUNTER — Other Ambulatory Visit: Payer: Self-pay | Admitting: Oncology

## 2019-11-10 ENCOUNTER — Other Ambulatory Visit: Payer: Self-pay

## 2019-11-10 DIAGNOSIS — D539 Nutritional anemia, unspecified: Secondary | ICD-10-CM

## 2019-11-10 DIAGNOSIS — D649 Anemia, unspecified: Secondary | ICD-10-CM | POA: Diagnosis not present

## 2019-11-10 DIAGNOSIS — Z0001 Encounter for general adult medical examination with abnormal findings: Secondary | ICD-10-CM | POA: Diagnosis not present

## 2019-11-10 DIAGNOSIS — D72819 Decreased white blood cell count, unspecified: Secondary | ICD-10-CM | POA: Diagnosis not present

## 2019-11-10 LAB — ABO/RH: ABO/RH(D): O POS

## 2019-11-10 LAB — PREPARE RBC (CROSSMATCH)

## 2019-11-11 ENCOUNTER — Inpatient Hospital Stay: Payer: Medicare HMO | Attending: Oncology

## 2019-11-11 ENCOUNTER — Other Ambulatory Visit: Payer: Self-pay

## 2019-11-11 ENCOUNTER — Other Ambulatory Visit: Payer: Self-pay | Admitting: Hematology and Oncology

## 2019-11-11 DIAGNOSIS — D539 Nutritional anemia, unspecified: Secondary | ICD-10-CM

## 2019-11-11 DIAGNOSIS — D61818 Other pancytopenia: Secondary | ICD-10-CM | POA: Insufficient documentation

## 2019-11-11 MED ORDER — SODIUM CHLORIDE 0.9% FLUSH
3.0000 mL | INTRAVENOUS | Status: DC | PRN
  Filled 2019-11-11: qty 10

## 2019-11-11 MED ORDER — DIPHENHYDRAMINE HCL 25 MG PO CAPS
25.0000 mg | ORAL_CAPSULE | Freq: Once | ORAL | Status: AC
  Administered 2019-11-11: 25 mg via ORAL

## 2019-11-11 MED ORDER — ACETAMINOPHEN 325 MG PO TABS
650.0000 mg | ORAL_TABLET | Freq: Once | ORAL | Status: AC
  Administered 2019-11-11: 650 mg via ORAL

## 2019-11-11 MED ORDER — DIPHENHYDRAMINE HCL 25 MG PO CAPS
ORAL_CAPSULE | ORAL | Status: AC
  Filled 2019-11-11: qty 1

## 2019-11-11 MED ORDER — ACETAMINOPHEN 325 MG PO TABS
ORAL_TABLET | ORAL | Status: AC
  Filled 2019-11-11: qty 2

## 2019-11-11 MED ORDER — SODIUM CHLORIDE 0.9% IV SOLUTION
250.0000 mL | Freq: Once | INTRAVENOUS | Status: DC
  Filled 2019-11-11: qty 250

## 2019-11-11 NOTE — Progress Notes (Signed)
PT STABLE AT TIME OF DISCHARGE 

## 2019-11-11 NOTE — Patient Instructions (Signed)

## 2019-11-12 LAB — TYPE AND SCREEN
ABO/RH(D): O POS
Antibody Screen: NEGATIVE
Unit division: 0
Unit division: 0

## 2019-11-12 LAB — BPAM RBC
Blood Product Expiration Date: 202111252359
Blood Product Expiration Date: 202111252359
ISSUE DATE / TIME: 202110260753
ISSUE DATE / TIME: 202110260753
Unit Type and Rh: 5100
Unit Type and Rh: 5100

## 2019-11-12 LAB — SURGICAL PATHOLOGY

## 2019-11-12 NOTE — Progress Notes (Signed)
PT STABLE AT TIME OF DISCHARGE 

## 2019-11-14 ENCOUNTER — Other Ambulatory Visit: Payer: Self-pay | Admitting: Oncology

## 2019-11-14 ENCOUNTER — Other Ambulatory Visit: Payer: Self-pay | Admitting: Pharmacist

## 2019-11-14 DIAGNOSIS — D61818 Other pancytopenia: Secondary | ICD-10-CM | POA: Diagnosis not present

## 2019-11-14 DIAGNOSIS — D72819 Decreased white blood cell count, unspecified: Secondary | ICD-10-CM | POA: Diagnosis not present

## 2019-11-14 DIAGNOSIS — D46Z Other myelodysplastic syndromes: Secondary | ICD-10-CM

## 2019-11-14 DIAGNOSIS — D649 Anemia, unspecified: Secondary | ICD-10-CM | POA: Diagnosis not present

## 2019-11-18 ENCOUNTER — Encounter (HOSPITAL_COMMUNITY): Payer: Self-pay | Admitting: Oncology

## 2019-11-18 NOTE — Progress Notes (Signed)
The patient is a 79 year old female with newly diagnosed high grade myelodysplasia.  Patient presents to clinic today for chemotherapy education and palliative care consult.  We will start azacitidine subcutaneous.  We will send in prescriptions for prochlorperazine and ondansetron.  The patient verbalizes understanding of and agreement to the plan as discussed today.  Provided general information including the following: 1.  Date of education: 11/18/2019 2.  Physician name: Dr. Lavera Guise 3.  Diagnosis: High grade myelodysplastic syndrome 4.  Stage: 5.  Curative  6.  Chemotherapy plan including drugs and how often: Azacitidine 7.  Start date:11/20/2019 8.  Other referrals: No other referrals at this time 9.  The patient is to call our office with any questions or concerns.  Our office number [phone], if after hours or on the weekend, call the same number and wait for the answering service.  There is always an oncologist on call 10.  Medications prescribed: compazine, zofran 11.  The patient has verbalized understanding of the treatment plan and has no barriers to adherence or understanding.  Obtained signed consent from patient.  Discussed symptoms including 1.  Low blood counts including red blood cells, white blood cells and platelets. 2. Infection including to avoid large crowds, wash hands frequently, and stay away from people who were sick.  If fever develops of 100.4 or higher, call our office. 3.  Mucositis-given instructions on mouth rinse (baking soda and salt mixture).  Keep mouth clean.  Use soft bristle toothbrush.  If mouth sores develop, call our clinic. 4.  Nausea/vomiting-gave prescriptions for ondansetron 4 mg every 4 hours as needed for nausea, may take around the clock if persistent.  Compazine 10 mg every 6 hours, may take around the clock if persistent. 5.  Diarrhea-use over-the-counter Imodium.  Call clinic if not controlled. 6.  Constipation-use senna, 1 to 2  tablets twice a day.  If no BM in 2 to 3 days call the clinic. 7.  Loss of appetite-try to eat small meals every 2-3 hours.  Call clinic if not eating. 8.  Taste changes-zinc 500 mg daily.  If becomes severe call clinic. 9.  Alcoholic beverages. 10.  Drink 2 to 3 quarts of water per day. 11.  Peripheral neuropathy-patient to call if numbness or tingling in hands or feet is persistent   Gave information on the supportive care team and how to contact them regarding services.  Discussed advanced directives.  The patient does not have their advanced directives but will look at the copy provided in their notebook and will call with any questions. [employer] Social worker Advanced directives  Answered questions to patient satisfaction.  Patient is to call with any further questions or concerns.  Time spent on this palliative care/chemotherapy education was 60 minutes with more than 50% spent discussing diagnosis, prognosis and symptom management.

## 2019-11-18 NOTE — Progress Notes (Signed)
PT STABLE AT TIME OF DISCHARGE 

## 2019-11-19 ENCOUNTER — Other Ambulatory Visit: Payer: Self-pay

## 2019-11-19 ENCOUNTER — Inpatient Hospital Stay: Payer: Medicare HMO | Attending: Hematology and Oncology | Admitting: Hematology and Oncology

## 2019-11-19 ENCOUNTER — Other Ambulatory Visit: Payer: Self-pay | Admitting: Hematology and Oncology

## 2019-11-19 DIAGNOSIS — Z79899 Other long term (current) drug therapy: Secondary | ICD-10-CM | POA: Diagnosis not present

## 2019-11-19 DIAGNOSIS — D469 Myelodysplastic syndrome, unspecified: Secondary | ICD-10-CM | POA: Insufficient documentation

## 2019-11-19 DIAGNOSIS — Z5111 Encounter for antineoplastic chemotherapy: Secondary | ICD-10-CM | POA: Insufficient documentation

## 2019-11-19 MED ORDER — ONDANSETRON HCL 4 MG PO TABS
4.0000 mg | ORAL_TABLET | ORAL | 3 refills | Status: AC | PRN
Start: 2019-11-19 — End: ?

## 2019-11-19 MED ORDER — PROCHLORPERAZINE MALEATE 10 MG PO TABS
10.0000 mg | ORAL_TABLET | Freq: Four times a day (QID) | ORAL | 3 refills | Status: AC | PRN
Start: 2019-11-19 — End: ?

## 2019-11-20 ENCOUNTER — Encounter (INDEPENDENT_AMBULATORY_CARE_PROVIDER_SITE_OTHER): Payer: Self-pay

## 2019-11-20 ENCOUNTER — Inpatient Hospital Stay: Payer: Medicare HMO

## 2019-11-20 VITALS — BP 152/52 | HR 61 | Resp 20 | Ht 61.4 in | Wt 213.8 lb

## 2019-11-20 DIAGNOSIS — D469 Myelodysplastic syndrome, unspecified: Secondary | ICD-10-CM | POA: Diagnosis not present

## 2019-11-20 DIAGNOSIS — Z5111 Encounter for antineoplastic chemotherapy: Secondary | ICD-10-CM | POA: Diagnosis not present

## 2019-11-20 DIAGNOSIS — D46Z Other myelodysplastic syndromes: Secondary | ICD-10-CM

## 2019-11-20 MED ORDER — ONDANSETRON HCL 8 MG PO TABS
8.0000 mg | ORAL_TABLET | Freq: Once | ORAL | Status: AC
  Administered 2019-11-20: 8 mg via ORAL

## 2019-11-20 MED ORDER — ONDANSETRON HCL 8 MG PO TABS
ORAL_TABLET | ORAL | Status: AC
  Filled 2019-11-20: qty 1

## 2019-11-20 MED ORDER — AZACITIDINE CHEMO SQ INJECTION
75.0000 mg/m2 | Freq: Once | INTRAMUSCULAR | Status: AC
  Administered 2019-11-20: 152.5 mg via SUBCUTANEOUS
  Filled 2019-11-20: qty 6.1

## 2019-11-20 NOTE — Progress Notes (Signed)
PT STABLE AT TIME OF DISCHARGE 

## 2019-11-20 NOTE — Patient Instructions (Signed)
[place] Discharge Instructions for Patients Receiving Chemotherapy  Today you received the following chemotherapy agents azacitadine  To help prevent nausea and vomiting after your treatment, we encourage you to take your nausea medication as prescribed.   If you develop nausea and vomiting that is not controlled by your nausea medication, call the clinic.   BELOW ARE SYMPTOMS THAT SHOULD BE REPORTED IMMEDIATELY:  *FEVER GREATER THAN 100.5 F  *CHILLS WITH OR WITHOUT FEVER  NAUSEA AND VOMITING THAT IS NOT CONTROLLED WITH YOUR NAUSEA MEDICATION  *UNUSUAL SHORTNESS OF BREATH  *UNUSUAL BRUISING OR BLEEDING  TENDERNESS IN MOUTH AND THROAT WITH OR WITHOUT PRESENCE OF ULCERS  *URINARY PROBLEMS  *BOWEL PROBLEMS  UNUSUAL RASH Items with * indicate a potential emergency and should be followed up as soon as possible.  Feel free to call the clinic should you have any questions or concerns at The clinic phone number is [phone].  Please show the [facility] at check-in to the Emergency Department and triage nurse.

## 2019-11-21 ENCOUNTER — Encounter (HOSPITAL_COMMUNITY): Payer: Self-pay | Admitting: Oncology

## 2019-11-21 ENCOUNTER — Other Ambulatory Visit: Payer: Self-pay

## 2019-11-21 ENCOUNTER — Telehealth: Payer: Self-pay | Admitting: Hematology and Oncology

## 2019-11-21 ENCOUNTER — Inpatient Hospital Stay: Payer: Medicare HMO

## 2019-11-21 VITALS — BP 121/46 | HR 62 | Temp 98.8°F | Resp 18 | Ht 61.4 in | Wt 214.0 lb

## 2019-11-21 DIAGNOSIS — Z5111 Encounter for antineoplastic chemotherapy: Secondary | ICD-10-CM | POA: Diagnosis not present

## 2019-11-21 DIAGNOSIS — D469 Myelodysplastic syndrome, unspecified: Secondary | ICD-10-CM | POA: Diagnosis not present

## 2019-11-21 DIAGNOSIS — D46Z Other myelodysplastic syndromes: Secondary | ICD-10-CM

## 2019-11-21 MED ORDER — ONDANSETRON HCL 8 MG PO TABS: 8.0000 mg | ORAL_TABLET | Freq: Once | ORAL | Status: DC

## 2019-11-21 MED ORDER — AZACITIDINE CHEMO SQ INJECTION
75.0000 mg/m2 | Freq: Once | INTRAMUSCULAR | Status: AC
  Administered 2019-11-21: 152.5 mg via SUBCUTANEOUS
  Filled 2019-11-21: qty 6.1

## 2019-11-21 NOTE — Patient Instructions (Signed)
[facility] Discharge Instructions for Patients Receiving Chemotherapy  Today you received the following chemotherapy agents VIDAZA  To help prevent nausea and vomiting after your treatment, we encourage you to take your nausea medication.   If you develop nausea and vomiting that is not controlled by your nausea medication, call the clinic.   BELOW ARE SYMPTOMS THAT SHOULD BE REPORTED IMMEDIATELY:  *FEVER GREATER THAN 100.5 F  *CHILLS WITH OR WITHOUT FEVER  NAUSEA AND VOMITING THAT IS NOT CONTROLLED WITH YOUR NAUSEA MEDICATION  *UNUSUAL SHORTNESS OF BREATH  *UNUSUAL BRUISING OR BLEEDING  TENDERNESS IN MOUTH AND THROAT WITH OR WITHOUT PRESENCE OF ULCERS  *URINARY PROBLEMS  *BOWEL PROBLEMS  UNUSUAL RASH Items with * indicate a potential emergency and should be followed up as soon as possible.  Feel free to call the clinic should you have any questions or concerns at The clinic phone number is [phone].  Please show the [facility] at check-in to the Emergency Department and triage nurse.

## 2019-11-21 NOTE — Progress Notes (Signed)
PT STABLE AT TIME OF DISCHARGE 

## 2019-11-21 NOTE — Telephone Encounter (Signed)
No 11/3 LOS.No changes made.

## 2019-11-23 LAB — SURGICAL PATHOLOGY

## 2019-11-24 ENCOUNTER — Inpatient Hospital Stay: Payer: Medicare HMO

## 2019-11-24 VITALS — BP 149/51 | HR 71 | Temp 98.0°F | Resp 18 | Ht 61.4 in | Wt 215.2 lb

## 2019-11-24 DIAGNOSIS — D469 Myelodysplastic syndrome, unspecified: Secondary | ICD-10-CM | POA: Diagnosis not present

## 2019-11-24 DIAGNOSIS — Z5111 Encounter for antineoplastic chemotherapy: Secondary | ICD-10-CM | POA: Diagnosis not present

## 2019-11-24 DIAGNOSIS — D46Z Other myelodysplastic syndromes: Secondary | ICD-10-CM

## 2019-11-24 MED ORDER — AZACITIDINE CHEMO SQ INJECTION
75.0000 mg/m2 | Freq: Once | INTRAMUSCULAR | Status: AC
  Administered 2019-11-24: 152.5 mg via SUBCUTANEOUS
  Filled 2019-11-24: qty 6.1

## 2019-11-24 MED ORDER — ONDANSETRON HCL 8 MG PO TABS: 8.0000 mg | ORAL_TABLET | Freq: Once | ORAL | Status: DC

## 2019-11-24 NOTE — Progress Notes (Signed)
PT STABLE AT TIME OF DISCHARGE 

## 2019-11-24 NOTE — Patient Instructions (Signed)
[facility] [place] Discharge Instructions for Patients Receiving Chemotherapy  Today you received the following chemotherapy agents Azacitidine.  To help prevent nausea and vomiting after your treatment, we encourage you to take your nausea medication.   If you develop nausea and vomiting that is not controlled by your nausea medication, call the clinic.   BELOW ARE SYMPTOMS THAT SHOULD BE REPORTED IMMEDIATELY:  *FEVER GREATER THAN 100.5 F  *CHILLS WITH OR WITHOUT FEVER  NAUSEA AND VOMITING THAT IS NOT CONTROLLED WITH YOUR NAUSEA MEDICATION  *UNUSUAL SHORTNESS OF BREATH  *UNUSUAL BRUISING OR BLEEDING  TENDERNESS IN MOUTH AND THROAT WITH OR WITHOUT PRESENCE OF ULCERS  *URINARY PROBLEMS  *BOWEL PROBLEMS  UNUSUAL RASH Items with * indicate a potential emergency and should be followed up as soon as possible.  Feel free to call the clinic should you have any questions or concerns at The clinic phone number is [phone].  Please show the CHEMO ALERT CARD at check-in to the Emergency Department and triage nurse.   

## 2019-11-25 ENCOUNTER — Other Ambulatory Visit: Payer: Self-pay

## 2019-11-25 ENCOUNTER — Inpatient Hospital Stay: Payer: Medicare HMO

## 2019-11-25 VITALS — BP 149/51 | HR 69 | Temp 99.1°F | Resp 18 | Ht 61.4 in | Wt 214.0 lb

## 2019-11-25 DIAGNOSIS — D469 Myelodysplastic syndrome, unspecified: Secondary | ICD-10-CM | POA: Diagnosis not present

## 2019-11-25 DIAGNOSIS — Z5111 Encounter for antineoplastic chemotherapy: Secondary | ICD-10-CM | POA: Diagnosis not present

## 2019-11-25 DIAGNOSIS — D46Z Other myelodysplastic syndromes: Secondary | ICD-10-CM

## 2019-11-25 MED ORDER — ONDANSETRON HCL 8 MG PO TABS: 8.0000 mg | ORAL_TABLET | Freq: Once | ORAL | Status: DC

## 2019-11-25 MED ORDER — AZACITIDINE CHEMO SQ INJECTION
75.0000 mg/m2 | Freq: Once | INTRAMUSCULAR | Status: AC
  Administered 2019-11-25: 152.5 mg via SUBCUTANEOUS
  Filled 2019-11-25: qty 6.1

## 2019-11-25 NOTE — Progress Notes (Signed)
PT STABLE AT TIME OF DISCHARGE 

## 2019-11-25 NOTE — Patient Instructions (Signed)
[facility] [place] Discharge Instructions for Patients Receiving Chemotherapy  Today you received the following chemotherapy agents Azacitidine.  To help prevent nausea and vomiting after your treatment, we encourage you to take your nausea medication.   If you develop nausea and vomiting that is not controlled by your nausea medication, call the clinic.   BELOW ARE SYMPTOMS THAT SHOULD BE REPORTED IMMEDIATELY:  *FEVER GREATER THAN 100.5 F  *CHILLS WITH OR WITHOUT FEVER  NAUSEA AND VOMITING THAT IS NOT CONTROLLED WITH YOUR NAUSEA MEDICATION  *UNUSUAL SHORTNESS OF BREATH  *UNUSUAL BRUISING OR BLEEDING  TENDERNESS IN MOUTH AND THROAT WITH OR WITHOUT PRESENCE OF ULCERS  *URINARY PROBLEMS  *BOWEL PROBLEMS  UNUSUAL RASH Items with * indicate a potential emergency and should be followed up as soon as possible.  Feel free to call the clinic should you have any questions or concerns at The clinic phone number is [phone].  Please show the CHEMO ALERT CARD at check-in to the Emergency Department and triage nurse.   

## 2019-11-26 ENCOUNTER — Inpatient Hospital Stay: Payer: Medicare HMO

## 2019-11-26 VITALS — BP 118/53 | HR 81 | Temp 98.5°F | Resp 18 | Ht 61.4 in | Wt 213.2 lb

## 2019-11-26 DIAGNOSIS — D46Z Other myelodysplastic syndromes: Secondary | ICD-10-CM

## 2019-11-26 DIAGNOSIS — D469 Myelodysplastic syndrome, unspecified: Secondary | ICD-10-CM | POA: Diagnosis not present

## 2019-11-26 DIAGNOSIS — Z5111 Encounter for antineoplastic chemotherapy: Secondary | ICD-10-CM | POA: Diagnosis not present

## 2019-11-26 MED ORDER — ONDANSETRON HCL 8 MG PO TABS: 8.0000 mg | ORAL_TABLET | Freq: Once | ORAL | Status: DC

## 2019-11-26 MED ORDER — AZACITIDINE CHEMO SQ INJECTION
75.0000 mg/m2 | Freq: Once | INTRAMUSCULAR | Status: AC
  Administered 2019-11-26: 152.5 mg via SUBCUTANEOUS
  Filled 2019-11-26: qty 6.1

## 2019-11-26 NOTE — Progress Notes (Signed)
PT STABLE AT TIME OF DISCHARGE 

## 2019-11-26 NOTE — Patient Instructions (Signed)
[facility] [place] Discharge Instructions for Patients Receiving Chemotherapy  Today you received the following chemotherapy agents Azacitidine.  To help prevent nausea and vomiting after your treatment, we encourage you to take your nausea medication.   If you develop nausea and vomiting that is not controlled by your nausea medication, call the clinic.   BELOW ARE SYMPTOMS THAT SHOULD BE REPORTED IMMEDIATELY:  *FEVER GREATER THAN 100.5 F  *CHILLS WITH OR WITHOUT FEVER  NAUSEA AND VOMITING THAT IS NOT CONTROLLED WITH YOUR NAUSEA MEDICATION  *UNUSUAL SHORTNESS OF BREATH  *UNUSUAL BRUISING OR BLEEDING  TENDERNESS IN MOUTH AND THROAT WITH OR WITHOUT PRESENCE OF ULCERS  *URINARY PROBLEMS  *BOWEL PROBLEMS  UNUSUAL RASH Items with * indicate a potential emergency and should be followed up as soon as possible.  Feel free to call the clinic should you have any questions or concerns at The clinic phone number is [phone].  Please show the CHEMO ALERT CARD at check-in to the Emergency Department and triage nurse.   

## 2019-11-27 ENCOUNTER — Other Ambulatory Visit: Payer: Self-pay

## 2019-11-27 ENCOUNTER — Inpatient Hospital Stay: Payer: Medicare HMO

## 2019-11-27 VITALS — BP 125/49 | HR 64 | Temp 98.2°F | Resp 18 | Ht 61.4 in | Wt 217.0 lb

## 2019-11-27 DIAGNOSIS — D469 Myelodysplastic syndrome, unspecified: Secondary | ICD-10-CM | POA: Diagnosis not present

## 2019-11-27 DIAGNOSIS — Z5111 Encounter for antineoplastic chemotherapy: Secondary | ICD-10-CM | POA: Diagnosis not present

## 2019-11-27 DIAGNOSIS — D46Z Other myelodysplastic syndromes: Secondary | ICD-10-CM

## 2019-11-27 MED ORDER — AZACITIDINE CHEMO SQ INJECTION
75.0000 mg/m2 | Freq: Once | INTRAMUSCULAR | Status: AC
  Administered 2019-11-27: 152.5 mg via SUBCUTANEOUS
  Filled 2019-11-27: qty 6.1

## 2019-11-27 MED ORDER — ONDANSETRON HCL 8 MG PO TABS: 8.0000 mg | ORAL_TABLET | Freq: Once | ORAL | Status: DC

## 2019-11-27 NOTE — Patient Instructions (Signed)
[facility] [place] Discharge Instructions for Patients Receiving Chemotherapy  Today you received the following chemotherapy agents Azacitidine.  To help prevent nausea and vomiting after your treatment, we encourage you to take your nausea medication.   If you develop nausea and vomiting that is not controlled by your nausea medication, call the clinic.   BELOW ARE SYMPTOMS THAT SHOULD BE REPORTED IMMEDIATELY:  *FEVER GREATER THAN 100.5 F  *CHILLS WITH OR WITHOUT FEVER  NAUSEA AND VOMITING THAT IS NOT CONTROLLED WITH YOUR NAUSEA MEDICATION  *UNUSUAL SHORTNESS OF BREATH  *UNUSUAL BRUISING OR BLEEDING  TENDERNESS IN MOUTH AND THROAT WITH OR WITHOUT PRESENCE OF ULCERS  *URINARY PROBLEMS  *BOWEL PROBLEMS  UNUSUAL RASH Items with * indicate a potential emergency and should be followed up as soon as possible.  Feel free to call the clinic should you have any questions or concerns at The clinic phone number is [phone].  Please show the CHEMO ALERT CARD at check-in to the Emergency Department and triage nurse.   

## 2019-11-27 NOTE — Progress Notes (Signed)
PT STABLE AT TIME OF DISCHARGE 

## 2019-11-28 ENCOUNTER — Inpatient Hospital Stay: Payer: Medicare HMO

## 2019-11-28 VITALS — BP 124/41 | HR 65 | Temp 98.6°F | Resp 18 | Ht 61.4 in | Wt 215.2 lb

## 2019-11-28 DIAGNOSIS — D46Z Other myelodysplastic syndromes: Secondary | ICD-10-CM

## 2019-11-28 DIAGNOSIS — D469 Myelodysplastic syndrome, unspecified: Secondary | ICD-10-CM | POA: Diagnosis not present

## 2019-11-28 DIAGNOSIS — Z5111 Encounter for antineoplastic chemotherapy: Secondary | ICD-10-CM | POA: Diagnosis not present

## 2019-11-28 MED ORDER — ONDANSETRON HCL 8 MG PO TABS: 8.0000 mg | ORAL_TABLET | Freq: Once | ORAL | Status: DC

## 2019-11-28 MED ORDER — AZACITIDINE CHEMO SQ INJECTION
75.0000 mg/m2 | Freq: Once | INTRAMUSCULAR | Status: AC
  Administered 2019-11-28: 152.5 mg via SUBCUTANEOUS
  Filled 2019-11-28: qty 6.1

## 2019-11-28 NOTE — Patient Instructions (Signed)
[facility] [place] Discharge Instructions for Patients Receiving Chemotherapy  Today you received the following chemotherapy agents Azacitidine.  To help prevent nausea and vomiting after your treatment, we encourage you to take your nausea medication.   If you develop nausea and vomiting that is not controlled by your nausea medication, call the clinic.   BELOW ARE SYMPTOMS THAT SHOULD BE REPORTED IMMEDIATELY:  *FEVER GREATER THAN 100.5 F  *CHILLS WITH OR WITHOUT FEVER  NAUSEA AND VOMITING THAT IS NOT CONTROLLED WITH YOUR NAUSEA MEDICATION  *UNUSUAL SHORTNESS OF BREATH  *UNUSUAL BRUISING OR BLEEDING  TENDERNESS IN MOUTH AND THROAT WITH OR WITHOUT PRESENCE OF ULCERS  *URINARY PROBLEMS  *BOWEL PROBLEMS  UNUSUAL RASH Items with * indicate a potential emergency and should be followed up as soon as possible.  Feel free to call the clinic should you have any questions or concerns at The clinic phone number is [phone].  Please show the CHEMO ALERT CARD at check-in to the Emergency Department and triage nurse.   

## 2019-11-28 NOTE — Progress Notes (Signed)
PT STABLE AT TIME OF DISCHARGE 

## 2019-12-02 ENCOUNTER — Telehealth: Payer: Self-pay

## 2019-12-02 NOTE — Telephone Encounter (Signed)
Pt would like to know if it would be ok for her to get her hair permed Thursday. [phone]

## 2019-12-14 DIAGNOSIS — L0390 Cellulitis, unspecified: Secondary | ICD-10-CM | POA: Diagnosis not present

## 2019-12-14 DIAGNOSIS — L89159 Pressure ulcer of sacral region, unspecified stage: Secondary | ICD-10-CM | POA: Diagnosis not present

## 2019-12-15 ENCOUNTER — Telehealth: Payer: Self-pay

## 2019-12-15 DIAGNOSIS — L03317 Cellulitis of buttock: Secondary | ICD-10-CM | POA: Diagnosis not present

## 2019-12-15 DIAGNOSIS — Z6841 Body Mass Index (BMI) 40.0 and over, adult: Secondary | ICD-10-CM | POA: Diagnosis not present

## 2019-12-15 DIAGNOSIS — Z7901 Long term (current) use of anticoagulants: Secondary | ICD-10-CM | POA: Diagnosis not present

## 2019-12-15 DIAGNOSIS — R5383 Other fatigue: Secondary | ICD-10-CM | POA: Diagnosis not present

## 2019-12-15 DIAGNOSIS — L988 Other specified disorders of the skin and subcutaneous tissue: Secondary | ICD-10-CM | POA: Diagnosis not present

## 2019-12-15 NOTE — Telephone Encounter (Addendum)
° °  Pt called to ask if she needed to come in Thursday for her scheduled appt? She has been to see her PCP. Pt is set up to get a Vit K infusion tomorrow, and 2 u PRBCs on Wednesday. She is also seeing Dr Amalia Hailey for the skin break in the crack of her butt per Dr Felipa Emory recommendation.   I notified Dr Bobby Rumpf of above. He does want pt to keep her appt's tomorrow here. He asked that she come down to clinic after Vit K infusion tomorrow.  I called pt back, notified her of Dr Bobby Rumpf recommendation. Pt states, "Ok Im just having a time with this [name]. I'll be down there tomorrow".  [phone]   I notified Tammy,CMA, @ 1610,that pt will becoming down tomorrow after Vit K infusion upstairs.

## 2019-12-16 ENCOUNTER — Other Ambulatory Visit: Payer: Medicare HMO

## 2019-12-16 ENCOUNTER — Other Ambulatory Visit: Payer: Self-pay | Admitting: Oncology

## 2019-12-16 ENCOUNTER — Ambulatory Visit: Payer: Medicare HMO | Admitting: Oncology

## 2019-12-16 DIAGNOSIS — E119 Type 2 diabetes mellitus without complications: Secondary | ICD-10-CM | POA: Diagnosis not present

## 2019-12-16 DIAGNOSIS — I4891 Unspecified atrial fibrillation: Secondary | ICD-10-CM | POA: Diagnosis not present

## 2019-12-16 DIAGNOSIS — D6859 Other primary thrombophilia: Secondary | ICD-10-CM | POA: Diagnosis not present

## 2019-12-16 DIAGNOSIS — D649 Anemia, unspecified: Secondary | ICD-10-CM | POA: Diagnosis not present

## 2019-12-16 DIAGNOSIS — I890 Lymphedema, not elsewhere classified: Secondary | ICD-10-CM | POA: Diagnosis not present

## 2019-12-16 DIAGNOSIS — I872 Venous insufficiency (chronic) (peripheral): Secondary | ICD-10-CM | POA: Diagnosis not present

## 2019-12-16 DIAGNOSIS — I509 Heart failure, unspecified: Secondary | ICD-10-CM | POA: Diagnosis not present

## 2019-12-16 DIAGNOSIS — L89153 Pressure ulcer of sacral region, stage 3: Secondary | ICD-10-CM | POA: Diagnosis not present

## 2019-12-17 ENCOUNTER — Telehealth: Payer: Self-pay | Admitting: Oncology

## 2019-12-17 ENCOUNTER — Other Ambulatory Visit: Payer: Self-pay | Admitting: Oncology

## 2019-12-17 DIAGNOSIS — D649 Anemia, unspecified: Secondary | ICD-10-CM | POA: Diagnosis not present

## 2019-12-17 DIAGNOSIS — D46Z Other myelodysplastic syndromes: Secondary | ICD-10-CM

## 2019-12-17 NOTE — Telephone Encounter (Signed)
Per 11/30 Staff Message, patient schedule for 7 days beginning 12/6 Vidaza Injections.  Instructed patient to be given updated Appt Summary on 12/2 Visit here

## 2019-12-17 NOTE — Progress Notes (Signed)
[facility]  [address] [place],  [place]  [address] [phone]  Clinic Day:  12/18/2019  Referring physician: Ernestene Kiel, MD   HISTORY OF PRESENT ILLNESS:  The patient is a 79 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  Her bone marrow biopsy revealed 5-6% blasts present.  She comes in today to be evaluated before heading into her 2nd cycle of azacitidine.  The patient claims to have tolerated her 1st cycle of treatment fairly well.  However, constipation has been a major issue which impacts her daily quality of life.  She has taken multiple therapies, including dulcolax, miralax, and magnesium citrate.  The latter eventually led to profuse diarrhea.  She has recently been prescribe Linzess by her primary care office.  With respect to her myelodysplasia, the patient required 2 units of blood earlier this week as her hemoglobin fell to 7.4.  During that time, she has significant fatigue and leg weakness, which had her very frustrated.    PHYSICAL EXAM:  Blood pressure (!) 152/67, pulse 82, temperature 98 F (36.7 C), resp. rate 16, height $RemoveBe'5\' 1"'uoxbzWwyd$  (1.549 m), weight 212 lb (96.2 kg), SpO2 96 %. Wt Readings from Last 3 Encounters:  12/18/19 212 lb (96.2 kg)  11/28/19 215 lb 4 oz (97.6 kg)  11/27/19 217 lb (98.4 kg)   Body mass index is 40.06 kg/m. Performance status (ECOG): 2 Physical Exam Constitutional:      Appearance: Normal appearance. She is not ill-appearing.  HENT:     Mouth/Throat:     Mouth: Mucous membranes are moist.     Pharynx: Oropharynx is clear. No oropharyngeal exudate or posterior oropharyngeal erythema.  Cardiovascular:     Rate and Rhythm: Normal rate and regular rhythm.     Heart sounds: No murmur heard.  No friction rub. No gallop.   Pulmonary:     Effort: Pulmonary effort is normal. No respiratory distress.     Breath sounds: Normal breath sounds. No wheezing, rhonchi or rales.  Abdominal:     General:  Bowel sounds are normal. There is no distension.     Palpations: Abdomen is soft. There is no mass.     Tenderness: There is no abdominal tenderness.  Musculoskeletal:        General: No swelling.     Right lower leg: No edema.     Left lower leg: No edema.  Lymphadenopathy:     Cervical: No cervical adenopathy.     Upper Body:     Right upper body: No supraclavicular or axillary adenopathy.     Left upper body: No supraclavicular or axillary adenopathy.     Lower Body: No right inguinal adenopathy. No left inguinal adenopathy.  Skin:    General: Skin is warm.     Coloration: Skin is not jaundiced.     Findings: No lesion or rash.  Neurological:     General: No focal deficit present.     Mental Status: She is alert and oriented to person, place, and time. Mental status is at baseline.     Cranial Nerves: Cranial nerves are intact.  Psychiatric:        Mood and Affect: Mood normal.        Behavior: Behavior normal.        Thought Content: Thought content normal.     LABS:   CBC Latest Ref Rng & Units 12/18/2019 11/07/2019 08/31/2016  WBC - 1.2 2.3 7.0  Hemoglobin 12.0 - 16.0 9.8(A)  7.9(A) 13.1  Hematocrit 36 - 46 30(A) 25(A) 39.7  Platelets 150 - 399 309 130(A) 208   CMP Latest Ref Rng & Units 12/18/2019 08/31/2016 10/29/2013  Glucose 65 - 99 mg/dL - 205(H) 86  BUN 4 - 21 53(A) 40(H) 27(H)  Creatinine 0.5 - 1.1 1.9(A) 1.33(H) 0.73  Sodium 137 - 147 138 139 140  Potassium 3.4 - 5.3 4.3 4.3 5.0  Chloride 99 - 108 104 104 101  CO2 13 - $Re'22 22 24 24  'xvk$ Calcium 8.7 - 10.7 9.3 9.2 9.1       ASSESSMENT & PLAN:   Assessment/Plan:  A 79 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  She will proceed with her 2nd cycle of azacitidine this week.  Clinically, she is doing okay.  I made sure she understood her baseline myelodysplasia is the main reason behind her weakness and how it may take multiple cycles of azacitidine in order to improve her peripheral counts.  It is also being  given to prevent her from transforming into acute leukemia.  I will see her back in 4 weeks before she heads into her 3rd cycle of azacitidine.  The patient understands all the plans discussed today and is in agreement with them.      Moira Umholtz Macarthur Critchley, MD

## 2019-12-18 ENCOUNTER — Other Ambulatory Visit: Payer: Self-pay | Admitting: Oncology

## 2019-12-18 ENCOUNTER — Other Ambulatory Visit: Payer: Self-pay | Admitting: Hematology and Oncology

## 2019-12-18 ENCOUNTER — Inpatient Hospital Stay (INDEPENDENT_AMBULATORY_CARE_PROVIDER_SITE_OTHER): Payer: Medicare HMO | Admitting: Oncology

## 2019-12-18 ENCOUNTER — Other Ambulatory Visit: Payer: Self-pay

## 2019-12-18 ENCOUNTER — Inpatient Hospital Stay: Payer: Medicare HMO | Attending: Oncology

## 2019-12-18 VITALS — BP 152/67 | HR 82 | Temp 98.0°F | Resp 16 | Ht 61.0 in | Wt 212.0 lb

## 2019-12-18 DIAGNOSIS — I872 Venous insufficiency (chronic) (peripheral): Secondary | ICD-10-CM | POA: Diagnosis not present

## 2019-12-18 DIAGNOSIS — D46Z Other myelodysplastic syndromes: Secondary | ICD-10-CM | POA: Diagnosis not present

## 2019-12-18 DIAGNOSIS — K5900 Constipation, unspecified: Secondary | ICD-10-CM | POA: Insufficient documentation

## 2019-12-18 DIAGNOSIS — Z7984 Long term (current) use of oral hypoglycemic drugs: Secondary | ICD-10-CM | POA: Diagnosis not present

## 2019-12-18 DIAGNOSIS — M6281 Muscle weakness (generalized): Secondary | ICD-10-CM | POA: Diagnosis not present

## 2019-12-18 DIAGNOSIS — L89153 Pressure ulcer of sacral region, stage 3: Secondary | ICD-10-CM | POA: Diagnosis not present

## 2019-12-18 DIAGNOSIS — D649 Anemia, unspecified: Secondary | ICD-10-CM | POA: Diagnosis not present

## 2019-12-18 DIAGNOSIS — Z5111 Encounter for antineoplastic chemotherapy: Secondary | ICD-10-CM | POA: Insufficient documentation

## 2019-12-18 DIAGNOSIS — I509 Heart failure, unspecified: Secondary | ICD-10-CM | POA: Diagnosis not present

## 2019-12-18 DIAGNOSIS — R600 Localized edema: Secondary | ICD-10-CM | POA: Insufficient documentation

## 2019-12-18 DIAGNOSIS — Z0001 Encounter for general adult medical examination with abnormal findings: Secondary | ICD-10-CM | POA: Diagnosis not present

## 2019-12-18 DIAGNOSIS — I110 Hypertensive heart disease with heart failure: Secondary | ICD-10-CM | POA: Diagnosis not present

## 2019-12-18 DIAGNOSIS — E1151 Type 2 diabetes mellitus with diabetic peripheral angiopathy without gangrene: Secondary | ICD-10-CM | POA: Diagnosis not present

## 2019-12-18 DIAGNOSIS — I4891 Unspecified atrial fibrillation: Secondary | ICD-10-CM | POA: Diagnosis not present

## 2019-12-18 DIAGNOSIS — Z7901 Long term (current) use of anticoagulants: Secondary | ICD-10-CM | POA: Diagnosis not present

## 2019-12-18 DIAGNOSIS — D4621 Refractory anemia with excess of blasts 1: Secondary | ICD-10-CM | POA: Insufficient documentation

## 2019-12-18 LAB — CBC
MCV: 93 (ref 76–111)
RBC: 3.19 — AB (ref 3.87–5.11)

## 2019-12-18 LAB — BASIC METABOLIC PANEL
BUN: 53 — AB (ref 4–21)
CO2: 22 (ref 13–22)
Chloride: 104 (ref 99–108)
Creatinine: 1.9 — AB (ref 0.5–1.1)
Glucose: 144
Potassium: 4.3 (ref 3.4–5.3)
Sodium: 138 (ref 137–147)

## 2019-12-18 LAB — CBC AND DIFFERENTIAL
HCT: 30 — AB (ref 36–46)
Hemoglobin: 9.8 — AB (ref 12.0–16.0)
Neutrophils Absolute: 0.4
Platelets: 309 (ref 150–399)
WBC: 1.2

## 2019-12-18 LAB — COMPREHENSIVE METABOLIC PANEL: Calcium: 9.3 (ref 8.7–10.7)

## 2019-12-19 ENCOUNTER — Telehealth: Payer: Self-pay | Admitting: Oncology

## 2019-12-19 NOTE — Telephone Encounter (Signed)
Patient Rescheduled 12/7 Injection Appt time to 8:33 am.  Conflict in her schedule

## 2019-12-22 ENCOUNTER — Inpatient Hospital Stay: Payer: Medicare HMO

## 2019-12-22 ENCOUNTER — Telehealth: Payer: Self-pay

## 2019-12-22 ENCOUNTER — Other Ambulatory Visit: Payer: Self-pay

## 2019-12-22 DIAGNOSIS — L89153 Pressure ulcer of sacral region, stage 3: Secondary | ICD-10-CM | POA: Diagnosis not present

## 2019-12-22 DIAGNOSIS — D4621 Refractory anemia with excess of blasts 1: Secondary | ICD-10-CM | POA: Diagnosis not present

## 2019-12-22 DIAGNOSIS — Z7984 Long term (current) use of oral hypoglycemic drugs: Secondary | ICD-10-CM | POA: Diagnosis not present

## 2019-12-22 DIAGNOSIS — R600 Localized edema: Secondary | ICD-10-CM | POA: Diagnosis not present

## 2019-12-22 DIAGNOSIS — D46Z Other myelodysplastic syndromes: Secondary | ICD-10-CM

## 2019-12-22 DIAGNOSIS — Z5111 Encounter for antineoplastic chemotherapy: Secondary | ICD-10-CM | POA: Diagnosis not present

## 2019-12-22 DIAGNOSIS — Z7901 Long term (current) use of anticoagulants: Secondary | ICD-10-CM | POA: Diagnosis not present

## 2019-12-22 DIAGNOSIS — I509 Heart failure, unspecified: Secondary | ICD-10-CM | POA: Diagnosis not present

## 2019-12-22 DIAGNOSIS — I872 Venous insufficiency (chronic) (peripheral): Secondary | ICD-10-CM | POA: Diagnosis not present

## 2019-12-22 DIAGNOSIS — E1151 Type 2 diabetes mellitus with diabetic peripheral angiopathy without gangrene: Secondary | ICD-10-CM | POA: Diagnosis not present

## 2019-12-22 DIAGNOSIS — I4891 Unspecified atrial fibrillation: Secondary | ICD-10-CM | POA: Diagnosis not present

## 2019-12-22 DIAGNOSIS — Z0001 Encounter for general adult medical examination with abnormal findings: Secondary | ICD-10-CM | POA: Diagnosis not present

## 2019-12-22 DIAGNOSIS — K5900 Constipation, unspecified: Secondary | ICD-10-CM | POA: Diagnosis not present

## 2019-12-22 DIAGNOSIS — M6281 Muscle weakness (generalized): Secondary | ICD-10-CM | POA: Diagnosis not present

## 2019-12-22 DIAGNOSIS — I110 Hypertensive heart disease with heart failure: Secondary | ICD-10-CM | POA: Diagnosis not present

## 2019-12-22 MED ORDER — ONDANSETRON HCL 8 MG PO TABS: 8.0000 mg | ORAL_TABLET | Freq: Once | ORAL | Status: DC

## 2019-12-22 MED ORDER — AZACITIDINE CHEMO SQ INJECTION
75.0000 mg/m2 | Freq: Once | INTRAMUSCULAR | Status: AC
  Administered 2019-12-22: 152.5 mg via SUBCUTANEOUS
  Filled 2019-12-22: qty 6.1

## 2019-12-22 NOTE — Patient Instructions (Signed)
[facility] [place] Discharge Instructions for Patients Receiving Chemotherapy  Today you received the following chemotherapy agents azacitadine  To help prevent nausea and vomiting after your treatment, we encourage you to take your nausea medication as directed.   If you develop nausea and vomiting that is not controlled by your nausea medication, call the clinic.   BELOW ARE SYMPTOMS THAT SHOULD BE REPORTED IMMEDIATELY:  *FEVER GREATER THAN 100.5 F  *CHILLS WITH OR WITHOUT FEVER  NAUSEA AND VOMITING THAT IS NOT CONTROLLED WITH YOUR NAUSEA MEDICATION  *UNUSUAL SHORTNESS OF BREATH  *UNUSUAL BRUISING OR BLEEDING  TENDERNESS IN MOUTH AND THROAT WITH OR WITHOUT PRESENCE OF ULCERS  *URINARY PROBLEMS  *BOWEL PROBLEMS  UNUSUAL RASH Items with * indicate a potential emergency and should be followed up as soon as possible.  Feel free to call the clinic should you have any questions or concerns at The clinic phone number is [phone].  Please show the CHEMO ALERT CARD at check-in to the Emergency Department and triage nurse.   

## 2019-12-22 NOTE — Progress Notes (Signed)
PT STABLE AT TIME OF DISCHARGE 

## 2019-12-22 NOTE — Telephone Encounter (Signed)
Pt LVM on triage line @ 1159, to report that her son-in-law, has tested positive for COVID 19. He was with her Saturday afternoon. She wanted to know if she should come for her appointment.  I called her back @ 1215 @ [phone] (which is [employer] phone number). The waitress stated she had already left the restaurant. I attempted to call pt's home number, no answer. Her phone just rings and rings. I notified Dr Bobby Rumpf of above. He asked that I make sure to notify infusion nurses.  I called Melissa,NP, @ [facility] facility to give them a heads up, as she is to start her next cycle of Vidaza today. Pt had already went to the facility, & is being sent back here for Angela,LPN< to do a rapid COVID test on her. Melissa, NP, has already spoken with Dr Bobby Rumpf.

## 2019-12-23 ENCOUNTER — Inpatient Hospital Stay: Payer: Medicare HMO

## 2019-12-23 ENCOUNTER — Other Ambulatory Visit: Payer: Self-pay

## 2019-12-23 VITALS — BP 111/42 | HR 64 | Temp 98.4°F | Resp 18 | Ht 61.0 in | Wt 205.8 lb

## 2019-12-23 DIAGNOSIS — Z5111 Encounter for antineoplastic chemotherapy: Secondary | ICD-10-CM | POA: Diagnosis not present

## 2019-12-23 DIAGNOSIS — D46Z Other myelodysplastic syndromes: Secondary | ICD-10-CM

## 2019-12-23 DIAGNOSIS — R600 Localized edema: Secondary | ICD-10-CM | POA: Diagnosis not present

## 2019-12-23 DIAGNOSIS — D4621 Refractory anemia with excess of blasts 1: Secondary | ICD-10-CM | POA: Diagnosis not present

## 2019-12-23 DIAGNOSIS — K5900 Constipation, unspecified: Secondary | ICD-10-CM | POA: Diagnosis not present

## 2019-12-23 MED ORDER — AZACITIDINE CHEMO SQ INJECTION
75.0000 mg/m2 | Freq: Once | INTRAMUSCULAR | Status: AC
  Administered 2019-12-23: 152.5 mg via SUBCUTANEOUS
  Filled 2019-12-23: qty 6.1

## 2019-12-23 MED ORDER — ONDANSETRON HCL 8 MG PO TABS: 8.0000 mg | ORAL_TABLET | Freq: Once | ORAL | Status: DC

## 2019-12-23 NOTE — Patient Instructions (Signed)
[facility] [place] Discharge Instructions for Patients Receiving Chemotherapy  Today you received the following chemotherapy agents Azacitadine  To help prevent nausea and vomiting after your treatment, we encourage you to take your nausea medication as directed.   If you develop nausea and vomiting that is not controlled by your nausea medication, call the clinic.   BELOW ARE SYMPTOMS THAT SHOULD BE REPORTED IMMEDIATELY:  *FEVER GREATER THAN 100.5 F  *CHILLS WITH OR WITHOUT FEVER  NAUSEA AND VOMITING THAT IS NOT CONTROLLED WITH YOUR NAUSEA MEDICATION  *UNUSUAL SHORTNESS OF BREATH  *UNUSUAL BRUISING OR BLEEDING  TENDERNESS IN MOUTH AND THROAT WITH OR WITHOUT PRESENCE OF ULCERS  *URINARY PROBLEMS  *BOWEL PROBLEMS  UNUSUAL RASH Items with * indicate a potential emergency and should be followed up as soon as possible.  Feel free to call the clinic should you have any questions or concerns at The clinic phone number is [phone].  Please show the CHEMO ALERT CARD at check-in to the Emergency Department and triage nurse.   

## 2019-12-23 NOTE — Progress Notes (Signed)
PT STABLE AT TIME OF DISCHARGE 

## 2019-12-24 ENCOUNTER — Inpatient Hospital Stay: Payer: Medicare HMO

## 2019-12-24 ENCOUNTER — Inpatient Hospital Stay: Payer: Medicare HMO | Admitting: Hematology and Oncology

## 2019-12-24 ENCOUNTER — Other Ambulatory Visit: Payer: Self-pay | Admitting: Hematology and Oncology

## 2019-12-24 DIAGNOSIS — Z5111 Encounter for antineoplastic chemotherapy: Secondary | ICD-10-CM | POA: Diagnosis not present

## 2019-12-24 DIAGNOSIS — R600 Localized edema: Secondary | ICD-10-CM | POA: Diagnosis not present

## 2019-12-24 DIAGNOSIS — D649 Anemia, unspecified: Secondary | ICD-10-CM | POA: Diagnosis not present

## 2019-12-24 DIAGNOSIS — K5900 Constipation, unspecified: Secondary | ICD-10-CM | POA: Diagnosis not present

## 2019-12-24 DIAGNOSIS — D46Z Other myelodysplastic syndromes: Secondary | ICD-10-CM

## 2019-12-24 DIAGNOSIS — Z0001 Encounter for general adult medical examination with abnormal findings: Secondary | ICD-10-CM | POA: Diagnosis not present

## 2019-12-24 DIAGNOSIS — D4621 Refractory anemia with excess of blasts 1: Secondary | ICD-10-CM | POA: Diagnosis not present

## 2019-12-24 LAB — COMPREHENSIVE METABOLIC PANEL
Albumin: 3.5 (ref 3.5–5.0)
Calcium: 8.7 (ref 8.7–10.7)

## 2019-12-24 LAB — CBC AND DIFFERENTIAL
HCT: 29 — AB (ref 36–46)
Hemoglobin: 9.1 — AB (ref 12.0–16.0)
Neutrophils Absolute: 0.25
Platelets: 361 (ref 150–399)
WBC: 1.1

## 2019-12-24 LAB — BASIC METABOLIC PANEL
BUN: 53 — AB (ref 4–21)
CO2: 20 (ref 13–22)
Chloride: 101 (ref 99–108)
Creatinine: 1.6 — AB (ref 0.5–1.1)
Glucose: 177
Potassium: 4.9 (ref 3.4–5.3)
Sodium: 136 — AB (ref 137–147)

## 2019-12-24 LAB — HEPATIC FUNCTION PANEL
ALT: 25 (ref 7–35)
AST: 30 (ref 13–35)
Alkaline Phosphatase: 367 — AB (ref 25–125)
Bilirubin, Total: 1.1

## 2019-12-24 LAB — CBC: RBC: 2.89 — AB (ref 3.87–5.11)

## 2019-12-24 MED ORDER — AZACITIDINE CHEMO SQ INJECTION
75.0000 mg/m2 | Freq: Once | INTRAMUSCULAR | Status: AC
  Administered 2019-12-24: 152.5 mg via SUBCUTANEOUS
  Filled 2019-12-24: qty 6.1

## 2019-12-24 MED ORDER — SODIUM CHLORIDE 0.9 % IV SOLN
Freq: Once | INTRAVENOUS | Status: AC
  Filled 2019-12-24: qty 250

## 2019-12-24 MED ORDER — ONDANSETRON HCL 8 MG PO TABS: 8.0000 mg | ORAL_TABLET | Freq: Once | ORAL | Status: DC

## 2019-12-24 MED ORDER — CEPHALEXIN 500 MG PO CAPS: 500.0000 mg | ORAL_CAPSULE | Freq: Two times a day (BID) | ORAL | 0 refills | Status: DC

## 2019-12-24 NOTE — Progress Notes (Signed)
On admission today to Cancer center patient reports extremities extremely weak feels like they are unable to hold her up, but able to walk with assistance/walker. Melissa FNP evaluated and orders IVF to be given today and labs unable to obtain enough blood for CMP d/t TCM and repeat CBC. Multiple staff attempts were unable to get blood.1515:PT STABLE AT TIME OF DISCHARGE. Was to report to [facility] on [address] for lab attempt and rapid Covid test. Friend agreed to pick her up at drive her to Cancer center and then home if fire dept would help get her in the house. Sam called Evelena Peat and arrangements were made for them to help and Evelena Peat to call when she is ready to leave [address] office. IV left for tomorrow treatment possible IVF or electrolyte replacement pending labs. Patient request a central line placement. Orders in progress per [facility] FNP. Patient states that she was feeling a little stronger since IVF given today.

## 2019-12-24 NOTE — Progress Notes (Signed)
Patient was able to find a ride home from treatment today, but was concerned that she may not be able to get into her home by herself. I called and spoke with Lajuana Matte, [employer]. He provided me with his direct number so we could call when she was on the way home and they would meet her there to assist. Suezanne Jacquet was called and they plan on being there when patient gets there.

## 2019-12-25 ENCOUNTER — Other Ambulatory Visit: Payer: Self-pay

## 2019-12-25 ENCOUNTER — Inpatient Hospital Stay: Payer: Medicare HMO

## 2019-12-25 VITALS — BP 116/45 | HR 74 | Temp 97.5°F | Resp 18

## 2019-12-25 DIAGNOSIS — D4621 Refractory anemia with excess of blasts 1: Secondary | ICD-10-CM | POA: Diagnosis not present

## 2019-12-25 DIAGNOSIS — D46Z Other myelodysplastic syndromes: Secondary | ICD-10-CM

## 2019-12-25 DIAGNOSIS — R600 Localized edema: Secondary | ICD-10-CM | POA: Diagnosis not present

## 2019-12-25 DIAGNOSIS — Z5111 Encounter for antineoplastic chemotherapy: Secondary | ICD-10-CM | POA: Diagnosis not present

## 2019-12-25 DIAGNOSIS — K5900 Constipation, unspecified: Secondary | ICD-10-CM | POA: Diagnosis not present

## 2019-12-25 MED ORDER — AZACITIDINE CHEMO SQ INJECTION
75.0000 mg/m2 | Freq: Once | INTRAMUSCULAR | Status: AC
  Administered 2019-12-25: 152.5 mg via SUBCUTANEOUS
  Filled 2019-12-25: qty 6.1

## 2019-12-25 MED ORDER — ONDANSETRON HCL 8 MG PO TABS: 8.0000 mg | ORAL_TABLET | Freq: Once | ORAL | Status: DC

## 2019-12-25 NOTE — Progress Notes (Signed)
1115:Patient continues to report severe weakness in lower extremities. Reports shaking x 4 extremities continues. She states she is unable to hold phone or stand for very long periods of time.Reported to Rosanne Sack [facility] she evaluated patient and called PCP for consult on medications. She is able to take po's without difficulty, so IVF was removed and no IVF given today d/t CHF by hx. VSS today. She agrees to go home and drink pedialyte forcing fluids. PT STABLE AT TIME OF DISCHARGE

## 2019-12-25 NOTE — Progress Notes (Signed)
Patient seen in infusion today, still very weak despite IVF yesterday. She denies lightheadedness, dyspnea, chest pain. States lower extremity swelling stable. She denies overt blood loss. Fatigue and weakness likely due to MDS and treatment. HgB 9.1 yesterday. Diastolic BP running under 50. D/W Dr. Laqueta Due, she states she gave her 2 units of PRBC's on 12/1, when her HgB was 7.8 on 11/29. Also, patient no longer on furosemide of metolazone. She is taken Bumex 2mg  daily and spironolactone 25mg , 1/2 tab daily. If she were going to make any adjustments, she states she would decrease spironolactone to 1/2 tab every other day. Patient instructed to try spironolactone 1/2 tab every other day. Advised her to let us know if she develops worsening edema or any new symptoms, such as dyspnea or lightheadedness. We will be seeing her regularly for several days, so can keep an eye on the BP.

## 2019-12-25 NOTE — Patient Instructions (Signed)
[facility] [place] Discharge Instructions for Patients Receiving Chemotherapy  Today you received the following chemotherapy agents azacitadine  To help prevent nausea and vomiting after your treatment, we encourage you to take your nausea medication as directed.   If you develop nausea and vomiting that is not controlled by your nausea medication, call the clinic.   BELOW ARE SYMPTOMS THAT SHOULD BE REPORTED IMMEDIATELY:  *FEVER GREATER THAN 100.5 F  *CHILLS WITH OR WITHOUT FEVER  NAUSEA AND VOMITING THAT IS NOT CONTROLLED WITH YOUR NAUSEA MEDICATION  *UNUSUAL SHORTNESS OF BREATH  *UNUSUAL BRUISING OR BLEEDING  TENDERNESS IN MOUTH AND THROAT WITH OR WITHOUT PRESENCE OF ULCERS  *URINARY PROBLEMS  *BOWEL PROBLEMS  UNUSUAL RASH Items with * indicate a potential emergency and should be followed up as soon as possible.  Feel free to call the clinic should you have any questions or concerns at The clinic phone number is [phone].  Please show the CHEMO ALERT CARD at check-in to the Emergency Department and triage nurse.   

## 2019-12-26 ENCOUNTER — Inpatient Hospital Stay: Payer: Medicare HMO

## 2019-12-26 ENCOUNTER — Other Ambulatory Visit: Payer: Self-pay | Admitting: Hematology and Oncology

## 2019-12-26 VITALS — BP 130/56 | HR 77 | Temp 98.7°F | Resp 20 | Ht 61.0 in | Wt 208.2 lb

## 2019-12-26 DIAGNOSIS — I509 Heart failure, unspecified: Secondary | ICD-10-CM | POA: Diagnosis not present

## 2019-12-26 DIAGNOSIS — M6281 Muscle weakness (generalized): Secondary | ICD-10-CM | POA: Diagnosis not present

## 2019-12-26 DIAGNOSIS — I872 Venous insufficiency (chronic) (peripheral): Secondary | ICD-10-CM | POA: Diagnosis not present

## 2019-12-26 DIAGNOSIS — R600 Localized edema: Secondary | ICD-10-CM | POA: Diagnosis not present

## 2019-12-26 DIAGNOSIS — Z5111 Encounter for antineoplastic chemotherapy: Secondary | ICD-10-CM | POA: Diagnosis not present

## 2019-12-26 DIAGNOSIS — E1151 Type 2 diabetes mellitus with diabetic peripheral angiopathy without gangrene: Secondary | ICD-10-CM | POA: Diagnosis not present

## 2019-12-26 DIAGNOSIS — I110 Hypertensive heart disease with heart failure: Secondary | ICD-10-CM | POA: Diagnosis not present

## 2019-12-26 DIAGNOSIS — Z7984 Long term (current) use of oral hypoglycemic drugs: Secondary | ICD-10-CM | POA: Diagnosis not present

## 2019-12-26 DIAGNOSIS — D46Z Other myelodysplastic syndromes: Secondary | ICD-10-CM

## 2019-12-26 DIAGNOSIS — Z7901 Long term (current) use of anticoagulants: Secondary | ICD-10-CM | POA: Diagnosis not present

## 2019-12-26 DIAGNOSIS — I4891 Unspecified atrial fibrillation: Secondary | ICD-10-CM | POA: Diagnosis not present

## 2019-12-26 DIAGNOSIS — L89153 Pressure ulcer of sacral region, stage 3: Secondary | ICD-10-CM | POA: Diagnosis not present

## 2019-12-26 DIAGNOSIS — D4621 Refractory anemia with excess of blasts 1: Secondary | ICD-10-CM | POA: Diagnosis not present

## 2019-12-26 DIAGNOSIS — K5900 Constipation, unspecified: Secondary | ICD-10-CM | POA: Diagnosis not present

## 2019-12-26 MED ORDER — HYDROCODONE-ACETAMINOPHEN 5-325 MG PO TABS
1.0000 | ORAL_TABLET | ORAL | 0 refills | Status: AC | PRN
Start: 2019-12-26 — End: ?

## 2019-12-26 MED ORDER — AZACITIDINE CHEMO SQ INJECTION
75.0000 mg/m2 | Freq: Once | INTRAMUSCULAR | Status: AC
  Administered 2019-12-26: 152.5 mg via SUBCUTANEOUS
  Filled 2019-12-26: qty 6.1

## 2019-12-26 MED ORDER — ONDANSETRON HCL 8 MG PO TABS: 8.0000 mg | ORAL_TABLET | Freq: Once | ORAL | Status: DC

## 2019-12-26 NOTE — Patient Instructions (Signed)
[facility] [place] Discharge Instructions for Patients Receiving Chemotherapy  Today you received the following chemotherapy agents azacitadine  To help prevent nausea and vomiting after your treatment, we encourage you to take your nausea medication as directed.   If you develop nausea and vomiting that is not controlled by your nausea medication, call the clinic.   BELOW ARE SYMPTOMS THAT SHOULD BE REPORTED IMMEDIATELY:  *FEVER GREATER THAN 100.5 F  *CHILLS WITH OR WITHOUT FEVER  NAUSEA AND VOMITING THAT IS NOT CONTROLLED WITH YOUR NAUSEA MEDICATION  *UNUSUAL SHORTNESS OF BREATH  *UNUSUAL BRUISING OR BLEEDING  TENDERNESS IN MOUTH AND THROAT WITH OR WITHOUT PRESENCE OF ULCERS  *URINARY PROBLEMS  *BOWEL PROBLEMS  UNUSUAL RASH Items with * indicate a potential emergency and should be followed up as soon as possible.  Feel free to call the clinic should you have any questions or concerns at The clinic phone number is [phone].  Please show the CHEMO ALERT CARD at check-in to the Emergency Department and triage nurse.   

## 2019-12-26 NOTE — Progress Notes (Signed)
PT STABLE AT TIME OF DISCHARGE 

## 2019-12-29 ENCOUNTER — Other Ambulatory Visit: Payer: Self-pay

## 2019-12-29 ENCOUNTER — Inpatient Hospital Stay: Payer: Medicare HMO

## 2019-12-29 VITALS — BP 119/37 | HR 64 | Temp 97.7°F | Resp 20 | Ht 61.0 in | Wt 205.2 lb

## 2019-12-29 DIAGNOSIS — M6281 Muscle weakness (generalized): Secondary | ICD-10-CM | POA: Diagnosis not present

## 2019-12-29 DIAGNOSIS — I872 Venous insufficiency (chronic) (peripheral): Secondary | ICD-10-CM | POA: Diagnosis not present

## 2019-12-29 DIAGNOSIS — I110 Hypertensive heart disease with heart failure: Secondary | ICD-10-CM | POA: Diagnosis not present

## 2019-12-29 DIAGNOSIS — K5900 Constipation, unspecified: Secondary | ICD-10-CM | POA: Diagnosis not present

## 2019-12-29 DIAGNOSIS — Z5111 Encounter for antineoplastic chemotherapy: Secondary | ICD-10-CM | POA: Diagnosis not present

## 2019-12-29 DIAGNOSIS — Z7901 Long term (current) use of anticoagulants: Secondary | ICD-10-CM | POA: Diagnosis not present

## 2019-12-29 DIAGNOSIS — I4891 Unspecified atrial fibrillation: Secondary | ICD-10-CM | POA: Diagnosis not present

## 2019-12-29 DIAGNOSIS — D46Z Other myelodysplastic syndromes: Secondary | ICD-10-CM

## 2019-12-29 DIAGNOSIS — I509 Heart failure, unspecified: Secondary | ICD-10-CM | POA: Diagnosis not present

## 2019-12-29 DIAGNOSIS — D4621 Refractory anemia with excess of blasts 1: Secondary | ICD-10-CM | POA: Diagnosis not present

## 2019-12-29 DIAGNOSIS — R600 Localized edema: Secondary | ICD-10-CM | POA: Diagnosis not present

## 2019-12-29 DIAGNOSIS — E1151 Type 2 diabetes mellitus with diabetic peripheral angiopathy without gangrene: Secondary | ICD-10-CM | POA: Diagnosis not present

## 2019-12-29 DIAGNOSIS — L89153 Pressure ulcer of sacral region, stage 3: Secondary | ICD-10-CM | POA: Diagnosis not present

## 2019-12-29 DIAGNOSIS — Z7984 Long term (current) use of oral hypoglycemic drugs: Secondary | ICD-10-CM | POA: Diagnosis not present

## 2019-12-29 MED ORDER — AZACITIDINE CHEMO SQ INJECTION
75.0000 mg/m2 | Freq: Once | INTRAMUSCULAR | Status: AC
  Administered 2019-12-29: 152.5 mg via SUBCUTANEOUS
  Filled 2019-12-29: qty 6.1

## 2019-12-29 MED ORDER — ONDANSETRON HCL 8 MG PO TABS: 8.0000 mg | ORAL_TABLET | Freq: Once | ORAL | Status: DC

## 2019-12-29 NOTE — Patient Instructions (Signed)
[facility] [place] Discharge Instructions for Patients Receiving Chemotherapy  Today you received the following chemotherapy agents Azacitadine  To help prevent nausea and vomiting after your treatment, we encourage you to take your nausea medication as directed.   If you develop nausea and vomiting that is not controlled by your nausea medication, call the clinic.   BELOW ARE SYMPTOMS THAT SHOULD BE REPORTED IMMEDIATELY:  *FEVER GREATER THAN 100.5 F  *CHILLS WITH OR WITHOUT FEVER  NAUSEA AND VOMITING THAT IS NOT CONTROLLED WITH YOUR NAUSEA MEDICATION  *UNUSUAL SHORTNESS OF BREATH  *UNUSUAL BRUISING OR BLEEDING  TENDERNESS IN MOUTH AND THROAT WITH OR WITHOUT PRESENCE OF ULCERS  *URINARY PROBLEMS  *BOWEL PROBLEMS  UNUSUAL RASH Items with * indicate a potential emergency and should be followed up as soon as possible.  Feel free to call the clinic should you have any questions or concerns at The clinic phone number is [phone].  Please show the CHEMO ALERT CARD at check-in to the Emergency Department and triage nurse.   

## 2019-12-29 NOTE — Progress Notes (Signed)
PT STABLE AT TIME OF DISCHARGE 

## 2019-12-30 ENCOUNTER — Inpatient Hospital Stay: Payer: Medicare HMO

## 2019-12-30 ENCOUNTER — Encounter: Payer: Self-pay | Admitting: Oncology

## 2019-12-30 VITALS — BP 152/65 | HR 80 | Temp 97.6°F | Resp 20 | Ht 61.0 in | Wt 206.8 lb

## 2019-12-30 DIAGNOSIS — K5900 Constipation, unspecified: Secondary | ICD-10-CM | POA: Diagnosis not present

## 2019-12-30 DIAGNOSIS — R600 Localized edema: Secondary | ICD-10-CM | POA: Diagnosis not present

## 2019-12-30 DIAGNOSIS — D46Z Other myelodysplastic syndromes: Secondary | ICD-10-CM

## 2019-12-30 DIAGNOSIS — Z5111 Encounter for antineoplastic chemotherapy: Secondary | ICD-10-CM | POA: Diagnosis not present

## 2019-12-30 DIAGNOSIS — D4621 Refractory anemia with excess of blasts 1: Secondary | ICD-10-CM | POA: Diagnosis not present

## 2019-12-30 MED ORDER — AZACITIDINE CHEMO SQ INJECTION
75.0000 mg/m2 | Freq: Once | INTRAMUSCULAR | Status: AC
  Administered 2019-12-30: 152.5 mg via SUBCUTANEOUS
  Filled 2019-12-30: qty 6.1

## 2019-12-30 MED ORDER — ONDANSETRON HCL 8 MG PO TABS: 8.0000 mg | ORAL_TABLET | Freq: Once | ORAL | Status: DC

## 2019-12-30 NOTE — Progress Notes (Signed)
PT STABLE AT TIME OF DISCHARGE 

## 2019-12-30 NOTE — Patient Instructions (Signed)
[facility] [place] Discharge Instructions for Patients Receiving Chemotherapy  Today you received the following chemotherapy agents Azacitadine  To help prevent nausea and vomiting after your treatment, we encourage you to take your nausea medication as directed.   If you develop nausea and vomiting that is not controlled by your nausea medication, call the clinic.   BELOW ARE SYMPTOMS THAT SHOULD BE REPORTED IMMEDIATELY:  *FEVER GREATER THAN 100.5 F  *CHILLS WITH OR WITHOUT FEVER  NAUSEA AND VOMITING THAT IS NOT CONTROLLED WITH YOUR NAUSEA MEDICATION  *UNUSUAL SHORTNESS OF BREATH  *UNUSUAL BRUISING OR BLEEDING  TENDERNESS IN MOUTH AND THROAT WITH OR WITHOUT PRESENCE OF ULCERS  *URINARY PROBLEMS  *BOWEL PROBLEMS  UNUSUAL RASH Items with * indicate a potential emergency and should be followed up as soon as possible.  Feel free to call the clinic should you have any questions or concerns at The clinic phone number is [phone].  Please show the CHEMO ALERT CARD at check-in to the Emergency Department and triage nurse.   

## 2019-12-31 DIAGNOSIS — I4891 Unspecified atrial fibrillation: Secondary | ICD-10-CM | POA: Diagnosis not present

## 2019-12-31 DIAGNOSIS — I509 Heart failure, unspecified: Secondary | ICD-10-CM | POA: Diagnosis not present

## 2019-12-31 DIAGNOSIS — I890 Lymphedema, not elsewhere classified: Secondary | ICD-10-CM | POA: Diagnosis not present

## 2019-12-31 DIAGNOSIS — E119 Type 2 diabetes mellitus without complications: Secondary | ICD-10-CM | POA: Diagnosis not present

## 2019-12-31 DIAGNOSIS — L89153 Pressure ulcer of sacral region, stage 3: Secondary | ICD-10-CM | POA: Diagnosis not present

## 2019-12-31 DIAGNOSIS — I872 Venous insufficiency (chronic) (peripheral): Secondary | ICD-10-CM | POA: Diagnosis not present

## 2020-01-01 DIAGNOSIS — R791 Abnormal coagulation profile: Secondary | ICD-10-CM | POA: Diagnosis not present

## 2020-01-01 DIAGNOSIS — Z7901 Long term (current) use of anticoagulants: Secondary | ICD-10-CM | POA: Diagnosis not present

## 2020-01-02 ENCOUNTER — Encounter: Payer: Self-pay | Admitting: Oncology

## 2020-01-02 DIAGNOSIS — Z7984 Long term (current) use of oral hypoglycemic drugs: Secondary | ICD-10-CM | POA: Diagnosis not present

## 2020-01-02 DIAGNOSIS — L89153 Pressure ulcer of sacral region, stage 3: Secondary | ICD-10-CM | POA: Diagnosis not present

## 2020-01-02 DIAGNOSIS — I509 Heart failure, unspecified: Secondary | ICD-10-CM | POA: Diagnosis not present

## 2020-01-02 DIAGNOSIS — Z7901 Long term (current) use of anticoagulants: Secondary | ICD-10-CM | POA: Diagnosis not present

## 2020-01-02 DIAGNOSIS — E1151 Type 2 diabetes mellitus with diabetic peripheral angiopathy without gangrene: Secondary | ICD-10-CM | POA: Diagnosis not present

## 2020-01-02 DIAGNOSIS — M6281 Muscle weakness (generalized): Secondary | ICD-10-CM | POA: Diagnosis not present

## 2020-01-02 DIAGNOSIS — I110 Hypertensive heart disease with heart failure: Secondary | ICD-10-CM | POA: Diagnosis not present

## 2020-01-02 DIAGNOSIS — I872 Venous insufficiency (chronic) (peripheral): Secondary | ICD-10-CM | POA: Diagnosis not present

## 2020-01-02 DIAGNOSIS — I4891 Unspecified atrial fibrillation: Secondary | ICD-10-CM | POA: Diagnosis not present

## 2020-01-05 DIAGNOSIS — Z7984 Long term (current) use of oral hypoglycemic drugs: Secondary | ICD-10-CM | POA: Diagnosis not present

## 2020-01-05 DIAGNOSIS — I110 Hypertensive heart disease with heart failure: Secondary | ICD-10-CM | POA: Diagnosis not present

## 2020-01-05 DIAGNOSIS — M6281 Muscle weakness (generalized): Secondary | ICD-10-CM | POA: Diagnosis not present

## 2020-01-05 DIAGNOSIS — Z7901 Long term (current) use of anticoagulants: Secondary | ICD-10-CM | POA: Diagnosis not present

## 2020-01-05 DIAGNOSIS — E1151 Type 2 diabetes mellitus with diabetic peripheral angiopathy without gangrene: Secondary | ICD-10-CM | POA: Diagnosis not present

## 2020-01-05 DIAGNOSIS — I509 Heart failure, unspecified: Secondary | ICD-10-CM | POA: Diagnosis not present

## 2020-01-05 DIAGNOSIS — I4891 Unspecified atrial fibrillation: Secondary | ICD-10-CM | POA: Diagnosis not present

## 2020-01-05 DIAGNOSIS — L89153 Pressure ulcer of sacral region, stage 3: Secondary | ICD-10-CM | POA: Diagnosis not present

## 2020-01-05 DIAGNOSIS — I872 Venous insufficiency (chronic) (peripheral): Secondary | ICD-10-CM | POA: Diagnosis not present

## 2020-01-06 ENCOUNTER — Telehealth: Payer: Self-pay | Admitting: Hematology and Oncology

## 2020-01-06 DIAGNOSIS — I110 Hypertensive heart disease with heart failure: Secondary | ICD-10-CM | POA: Diagnosis not present

## 2020-01-06 DIAGNOSIS — Z7901 Long term (current) use of anticoagulants: Secondary | ICD-10-CM | POA: Diagnosis not present

## 2020-01-06 DIAGNOSIS — Z7984 Long term (current) use of oral hypoglycemic drugs: Secondary | ICD-10-CM | POA: Diagnosis not present

## 2020-01-06 DIAGNOSIS — E1151 Type 2 diabetes mellitus with diabetic peripheral angiopathy without gangrene: Secondary | ICD-10-CM | POA: Diagnosis not present

## 2020-01-06 DIAGNOSIS — I4891 Unspecified atrial fibrillation: Secondary | ICD-10-CM | POA: Diagnosis not present

## 2020-01-06 DIAGNOSIS — L89153 Pressure ulcer of sacral region, stage 3: Secondary | ICD-10-CM | POA: Diagnosis not present

## 2020-01-06 DIAGNOSIS — M6281 Muscle weakness (generalized): Secondary | ICD-10-CM | POA: Diagnosis not present

## 2020-01-06 DIAGNOSIS — I509 Heart failure, unspecified: Secondary | ICD-10-CM | POA: Diagnosis not present

## 2020-01-06 DIAGNOSIS — Z1152 Encounter for screening for COVID-19: Secondary | ICD-10-CM | POA: Diagnosis not present

## 2020-01-06 DIAGNOSIS — I872 Venous insufficiency (chronic) (peripheral): Secondary | ICD-10-CM | POA: Diagnosis not present

## 2020-01-06 NOTE — Telephone Encounter (Signed)
Per 12/10 Staff Message for [facility] Placement

## 2020-01-06 NOTE — Telephone Encounter (Signed)
Per 12/21 LOS, patient scheduled for 12/28 [facility] Placement.  Gave patient Instructions (NPO after Midnight - Withhold Coudamin 4 days before Appt).  Instructed her she would need COVID Test and they would need copy of the results.

## 2020-01-07 DIAGNOSIS — Z7984 Long term (current) use of oral hypoglycemic drugs: Secondary | ICD-10-CM | POA: Diagnosis not present

## 2020-01-07 DIAGNOSIS — E1151 Type 2 diabetes mellitus with diabetic peripheral angiopathy without gangrene: Secondary | ICD-10-CM | POA: Diagnosis not present

## 2020-01-07 DIAGNOSIS — I110 Hypertensive heart disease with heart failure: Secondary | ICD-10-CM | POA: Diagnosis not present

## 2020-01-07 DIAGNOSIS — Z7901 Long term (current) use of anticoagulants: Secondary | ICD-10-CM | POA: Diagnosis not present

## 2020-01-07 DIAGNOSIS — M6281 Muscle weakness (generalized): Secondary | ICD-10-CM | POA: Diagnosis not present

## 2020-01-07 DIAGNOSIS — L89153 Pressure ulcer of sacral region, stage 3: Secondary | ICD-10-CM | POA: Diagnosis not present

## 2020-01-07 DIAGNOSIS — I872 Venous insufficiency (chronic) (peripheral): Secondary | ICD-10-CM | POA: Diagnosis not present

## 2020-01-07 DIAGNOSIS — I509 Heart failure, unspecified: Secondary | ICD-10-CM | POA: Diagnosis not present

## 2020-01-07 DIAGNOSIS — I4891 Unspecified atrial fibrillation: Secondary | ICD-10-CM | POA: Diagnosis not present

## 2020-01-12 ENCOUNTER — Other Ambulatory Visit: Payer: Self-pay | Admitting: Oncology

## 2020-01-12 DIAGNOSIS — I4891 Unspecified atrial fibrillation: Secondary | ICD-10-CM | POA: Diagnosis not present

## 2020-01-12 DIAGNOSIS — M6281 Muscle weakness (generalized): Secondary | ICD-10-CM | POA: Diagnosis not present

## 2020-01-12 DIAGNOSIS — Z7901 Long term (current) use of anticoagulants: Secondary | ICD-10-CM | POA: Diagnosis not present

## 2020-01-12 DIAGNOSIS — I509 Heart failure, unspecified: Secondary | ICD-10-CM | POA: Diagnosis not present

## 2020-01-12 DIAGNOSIS — Z7984 Long term (current) use of oral hypoglycemic drugs: Secondary | ICD-10-CM | POA: Diagnosis not present

## 2020-01-12 DIAGNOSIS — E1151 Type 2 diabetes mellitus with diabetic peripheral angiopathy without gangrene: Secondary | ICD-10-CM | POA: Diagnosis not present

## 2020-01-12 DIAGNOSIS — L89153 Pressure ulcer of sacral region, stage 3: Secondary | ICD-10-CM | POA: Diagnosis not present

## 2020-01-12 DIAGNOSIS — I110 Hypertensive heart disease with heart failure: Secondary | ICD-10-CM | POA: Diagnosis not present

## 2020-01-12 DIAGNOSIS — I872 Venous insufficiency (chronic) (peripheral): Secondary | ICD-10-CM | POA: Diagnosis not present

## 2020-01-13 ENCOUNTER — Other Ambulatory Visit: Payer: Self-pay | Admitting: Hematology and Oncology

## 2020-01-13 DIAGNOSIS — Z452 Encounter for adjustment and management of vascular access device: Secondary | ICD-10-CM | POA: Diagnosis not present

## 2020-01-13 DIAGNOSIS — D46Z Other myelodysplastic syndromes: Secondary | ICD-10-CM | POA: Diagnosis not present

## 2020-01-13 LAB — PROTIME-INR (CHCC SATELLITE): INR: 1.3 — AB (ref 0.9–1.1)

## 2020-01-13 LAB — BASIC METABOLIC PANEL
BUN: 37 — AB (ref 4–21)
CO2: 26 — AB (ref 13–22)
Chloride: 104 (ref 99–108)
Creatinine: 1.2 — AB (ref 0.5–1.1)
Glucose: 118
Potassium: 4.3 (ref 3.4–5.3)
Sodium: 139 (ref 137–147)

## 2020-01-13 LAB — CBC AND DIFFERENTIAL
HCT: 28 — AB (ref 36–46)
Hemoglobin: 9.1 — AB (ref 12.0–16.0)
Platelets: 381 (ref 150–399)
WBC: 0.5

## 2020-01-13 LAB — CBC
MCV: 101 — AB (ref 81–99)
RBC: 2.83 — AB (ref 3.87–5.11)

## 2020-01-13 LAB — PROTIME-INR: Protime: 13.5 (ref 10.0–13.8)

## 2020-01-13 LAB — COMPREHENSIVE METABOLIC PANEL: Calcium: 8.9 (ref 8.7–10.7)

## 2020-01-15 ENCOUNTER — Telehealth: Payer: Self-pay | Admitting: Oncology

## 2020-01-15 DIAGNOSIS — I110 Hypertensive heart disease with heart failure: Secondary | ICD-10-CM | POA: Diagnosis not present

## 2020-01-15 DIAGNOSIS — L89153 Pressure ulcer of sacral region, stage 3: Secondary | ICD-10-CM | POA: Diagnosis not present

## 2020-01-15 DIAGNOSIS — Z7984 Long term (current) use of oral hypoglycemic drugs: Secondary | ICD-10-CM | POA: Diagnosis not present

## 2020-01-15 DIAGNOSIS — I872 Venous insufficiency (chronic) (peripheral): Secondary | ICD-10-CM | POA: Diagnosis not present

## 2020-01-15 DIAGNOSIS — I509 Heart failure, unspecified: Secondary | ICD-10-CM | POA: Diagnosis not present

## 2020-01-15 DIAGNOSIS — Z7901 Long term (current) use of anticoagulants: Secondary | ICD-10-CM | POA: Diagnosis not present

## 2020-01-15 DIAGNOSIS — M6281 Muscle weakness (generalized): Secondary | ICD-10-CM | POA: Diagnosis not present

## 2020-01-15 DIAGNOSIS — E1151 Type 2 diabetes mellitus with diabetic peripheral angiopathy without gangrene: Secondary | ICD-10-CM | POA: Diagnosis not present

## 2020-01-15 DIAGNOSIS — I4891 Unspecified atrial fibrillation: Secondary | ICD-10-CM | POA: Diagnosis not present

## 2020-01-15 NOTE — Telephone Encounter (Signed)
Per 12/30 appts sched and given to patient

## 2020-01-18 NOTE — Progress Notes (Unsigned)
[facility]  [address] [place],  [place]  [address] [phone]  Clinic Day:  01/19/2020  Referring physician: Ernestene Kiel, MD   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  Her bone marrow biopsy revealed 5-6% blasts present.  She comes in today to be evaluated before heading into her 3rd cycle of azacitidine.  The patient claims to have tolerated her 2nd cycle of treatment fairly well.  With respect to her myelodysplasia, she has not needed a blood transfusion in over a month.  However, she still complains of being weak.  Her weakness significantly impacts her mobility.  Overall, she remains frustrated with her clinical picture, although she really has not fully grasped how her myelodysplasia is factoring into all of this.  PHYSICAL EXAM:  Blood pressure 134/63, pulse 72, temperature 98 F (36.7 C), resp. rate 16, height $RemoveBe'5\' 1"'qcFmdiEWF$  (1.549 m), weight 206 lb 1.6 oz (93.5 kg), SpO2 94 %. Wt Readings from Last 3 Encounters:  01/19/20 206 lb 1.6 oz (93.5 kg)  12/30/19 206 lb 12 oz (93.8 kg)  12/29/19 205 lb 4 oz (93.1 kg)   Body mass index is 38.94 kg/m. Performance status (ECOG): 2 Physical Exam Constitutional:      Appearance: Normal appearance. She is not ill-appearing.  HENT:     Mouth/Throat:     Mouth: Mucous membranes are moist.     Pharynx: Oropharynx is clear. No oropharyngeal exudate or posterior oropharyngeal erythema.  Cardiovascular:     Rate and Rhythm: Normal rate and regular rhythm.     Heart sounds: No murmur heard. No friction rub. No gallop.   Pulmonary:     Effort: Pulmonary effort is normal. No respiratory distress.     Breath sounds: Normal breath sounds. No wheezing, rhonchi or rales.  Chest:  Breasts:     Right: No axillary adenopathy or supraclavicular adenopathy.     Left: No axillary adenopathy or supraclavicular adenopathy.    Abdominal:     General:  Bowel sounds are normal. There is no distension.     Palpations: Abdomen is soft. There is no mass.     Tenderness: There is no abdominal tenderness.  Musculoskeletal:        General: No swelling.     Right lower leg: No edema.     Left lower leg: No edema.  Lymphadenopathy:     Cervical: No cervical adenopathy.     Upper Body:     Right upper body: No supraclavicular or axillary adenopathy.     Left upper body: No supraclavicular or axillary adenopathy.     Lower Body: No right inguinal adenopathy. No left inguinal adenopathy.  Skin:    General: Skin is warm.     Coloration: Skin is not jaundiced.     Findings: Lesion (violaceous lesion on her inner left thigh, measuring 2-3 cim) present. No rash.  Neurological:     General: No focal deficit present.     Mental Status: She is alert and oriented to person, place, and time. Mental status is at baseline.     Cranial Nerves: Cranial nerves are intact.  Psychiatric:        Mood and Affect: Mood normal.        Behavior: Behavior normal.        Thought Content: Thought content normal.     LABS:   CBC Latest Ref Rng & Units 01/19/2020 01/13/2020 12/24/2019  WBC - 2.5 0.5 1.1  Hemoglobin 12.0 - 16.0 9.5(A) 9.1(A) 9.1(A)  Hematocrit 36 - 46 29(A) 28(A) 29(A)  Platelets 150 - 399 344 381 361   CMP Latest Ref Rng & Units 01/19/2020 01/13/2020 12/24/2019  Glucose 65 - 99 mg/dL - - -  BUN 4 - 21 38(A) 37(A) 53(A)  Creatinine 0.5 - 1.1 1.3(A) 1.2(A) 1.6(A)  Sodium 137 - 147 136(A) 139 136(A)  Potassium 3.4 - 5.3 4.2 4.3 4.9  Chloride 99 - 108 103 104 101  CO2 13 - 22 20 26(A) 20  Calcium 8.7 - 10.7 8.5(A) 8.9 8.7  Alkaline Phos 25 - 125 - - 367(A)  AST 13 - 35 - - 30  ALT 7 - 35 - - 25    ASSESSMENT & PLAN:  Assessment/Plan:  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  She will proceed with her 3rd cycle of azacitidine this week.  I am pleased as her hemoglobin has been rising without needing a blood transfusion.  This  suggests her bone marrow is responding to her azacitidine.  My only concern is the violaceous lesion on her left thigh.  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  A biopsy of this area will be done to clarify its nature.  Due to her increased weakness, we will have physical therapy work with her over these next few weeks.  Otherwise, I will see her back in 4 weeks before she heads into her 4th cycle of azacitidine.  The patient understands all the plans discussed today and is in agreement with them.      Laura Baldridge Macarthur Critchley, MD

## 2020-01-19 ENCOUNTER — Other Ambulatory Visit: Payer: Self-pay | Admitting: Hematology and Oncology

## 2020-01-19 ENCOUNTER — Other Ambulatory Visit: Payer: Self-pay

## 2020-01-19 ENCOUNTER — Inpatient Hospital Stay: Payer: Medicare HMO

## 2020-01-19 ENCOUNTER — Inpatient Hospital Stay: Payer: Medicare HMO | Attending: Oncology | Admitting: Oncology

## 2020-01-19 ENCOUNTER — Telehealth: Payer: Self-pay | Admitting: Oncology

## 2020-01-19 ENCOUNTER — Other Ambulatory Visit: Payer: Self-pay | Admitting: Oncology

## 2020-01-19 ENCOUNTER — Encounter: Payer: Self-pay | Admitting: Oncology

## 2020-01-19 VITALS — BP 134/63 | HR 72 | Temp 98.0°F | Resp 16 | Ht 61.0 in | Wt 206.1 lb

## 2020-01-19 DIAGNOSIS — Z7982 Long term (current) use of aspirin: Secondary | ICD-10-CM | POA: Insufficient documentation

## 2020-01-19 DIAGNOSIS — D46Z Other myelodysplastic syndromes: Secondary | ICD-10-CM

## 2020-01-19 DIAGNOSIS — Z79899 Other long term (current) drug therapy: Secondary | ICD-10-CM | POA: Insufficient documentation

## 2020-01-19 DIAGNOSIS — Z7984 Long term (current) use of oral hypoglycemic drugs: Secondary | ICD-10-CM | POA: Insufficient documentation

## 2020-01-19 DIAGNOSIS — K219 Gastro-esophageal reflux disease without esophagitis: Secondary | ICD-10-CM | POA: Insufficient documentation

## 2020-01-19 DIAGNOSIS — E039 Hypothyroidism, unspecified: Secondary | ICD-10-CM | POA: Insufficient documentation

## 2020-01-19 DIAGNOSIS — E119 Type 2 diabetes mellitus without complications: Secondary | ICD-10-CM | POA: Insufficient documentation

## 2020-01-19 DIAGNOSIS — D4621 Refractory anemia with excess of blasts 1: Secondary | ICD-10-CM | POA: Insufficient documentation

## 2020-01-19 DIAGNOSIS — Z8249 Family history of ischemic heart disease and other diseases of the circulatory system: Secondary | ICD-10-CM | POA: Insufficient documentation

## 2020-01-19 DIAGNOSIS — Z7901 Long term (current) use of anticoagulants: Secondary | ICD-10-CM | POA: Insufficient documentation

## 2020-01-19 DIAGNOSIS — Z8673 Personal history of transient ischemic attack (TIA), and cerebral infarction without residual deficits: Secondary | ICD-10-CM | POA: Insufficient documentation

## 2020-01-19 DIAGNOSIS — D649 Anemia, unspecified: Secondary | ICD-10-CM | POA: Diagnosis not present

## 2020-01-19 DIAGNOSIS — K589 Irritable bowel syndrome without diarrhea: Secondary | ICD-10-CM | POA: Insufficient documentation

## 2020-01-19 DIAGNOSIS — I4891 Unspecified atrial fibrillation: Secondary | ICD-10-CM | POA: Insufficient documentation

## 2020-01-19 DIAGNOSIS — I10 Essential (primary) hypertension: Secondary | ICD-10-CM | POA: Insufficient documentation

## 2020-01-19 DIAGNOSIS — Z950 Presence of cardiac pacemaker: Secondary | ICD-10-CM | POA: Insufficient documentation

## 2020-01-19 DIAGNOSIS — Z5111 Encounter for antineoplastic chemotherapy: Secondary | ICD-10-CM | POA: Insufficient documentation

## 2020-01-19 LAB — BASIC METABOLIC PANEL
BUN: 38 — AB (ref 4–21)
CO2: 20 (ref 13–22)
Chloride: 103 (ref 99–108)
Creatinine: 1.3 — AB (ref 0.5–1.1)
Glucose: 119
Potassium: 4.2 (ref 3.4–5.3)
Sodium: 136 — AB (ref 137–147)

## 2020-01-19 LAB — COMPREHENSIVE METABOLIC PANEL: Calcium: 8.5 — AB (ref 8.7–10.7)

## 2020-01-19 LAB — CBC AND DIFFERENTIAL
HCT: 29 — AB (ref 36–46)
Hemoglobin: 9.5 — AB (ref 12.0–16.0)
Neutrophils Absolute: 1.58
Platelets: 344 (ref 150–399)
WBC: 2.5

## 2020-01-19 LAB — CBC: RBC: 2.81 — AB (ref 3.87–5.11)

## 2020-01-19 NOTE — Telephone Encounter (Signed)
Per 1/3 los next appt given to patient 

## 2020-01-20 ENCOUNTER — Inpatient Hospital Stay: Payer: Medicare HMO

## 2020-01-20 VITALS — BP 130/61 | HR 68 | Temp 98.1°F | Resp 18 | Ht 61.0 in | Wt 206.5 lb

## 2020-01-20 DIAGNOSIS — Z8673 Personal history of transient ischemic attack (TIA), and cerebral infarction without residual deficits: Secondary | ICD-10-CM | POA: Diagnosis not present

## 2020-01-20 DIAGNOSIS — E1151 Type 2 diabetes mellitus with diabetic peripheral angiopathy without gangrene: Secondary | ICD-10-CM | POA: Diagnosis not present

## 2020-01-20 DIAGNOSIS — Z5111 Encounter for antineoplastic chemotherapy: Secondary | ICD-10-CM | POA: Diagnosis not present

## 2020-01-20 DIAGNOSIS — I872 Venous insufficiency (chronic) (peripheral): Secondary | ICD-10-CM | POA: Diagnosis not present

## 2020-01-20 DIAGNOSIS — Z7984 Long term (current) use of oral hypoglycemic drugs: Secondary | ICD-10-CM | POA: Diagnosis not present

## 2020-01-20 DIAGNOSIS — I10 Essential (primary) hypertension: Secondary | ICD-10-CM | POA: Diagnosis not present

## 2020-01-20 DIAGNOSIS — I4891 Unspecified atrial fibrillation: Secondary | ICD-10-CM | POA: Diagnosis not present

## 2020-01-20 DIAGNOSIS — Z7901 Long term (current) use of anticoagulants: Secondary | ICD-10-CM | POA: Diagnosis not present

## 2020-01-20 DIAGNOSIS — Z7982 Long term (current) use of aspirin: Secondary | ICD-10-CM | POA: Diagnosis not present

## 2020-01-20 DIAGNOSIS — K589 Irritable bowel syndrome without diarrhea: Secondary | ICD-10-CM | POA: Diagnosis not present

## 2020-01-20 DIAGNOSIS — I509 Heart failure, unspecified: Secondary | ICD-10-CM | POA: Diagnosis not present

## 2020-01-20 DIAGNOSIS — E119 Type 2 diabetes mellitus without complications: Secondary | ICD-10-CM | POA: Diagnosis not present

## 2020-01-20 DIAGNOSIS — K219 Gastro-esophageal reflux disease without esophagitis: Secondary | ICD-10-CM | POA: Diagnosis not present

## 2020-01-20 DIAGNOSIS — D4621 Refractory anemia with excess of blasts 1: Secondary | ICD-10-CM | POA: Diagnosis not present

## 2020-01-20 DIAGNOSIS — E039 Hypothyroidism, unspecified: Secondary | ICD-10-CM | POA: Diagnosis not present

## 2020-01-20 DIAGNOSIS — I110 Hypertensive heart disease with heart failure: Secondary | ICD-10-CM | POA: Diagnosis not present

## 2020-01-20 DIAGNOSIS — M6281 Muscle weakness (generalized): Secondary | ICD-10-CM | POA: Diagnosis not present

## 2020-01-20 DIAGNOSIS — Z79899 Other long term (current) drug therapy: Secondary | ICD-10-CM | POA: Diagnosis not present

## 2020-01-20 DIAGNOSIS — D46Z Other myelodysplastic syndromes: Secondary | ICD-10-CM

## 2020-01-20 DIAGNOSIS — L89153 Pressure ulcer of sacral region, stage 3: Secondary | ICD-10-CM | POA: Diagnosis not present

## 2020-01-20 DIAGNOSIS — Z8249 Family history of ischemic heart disease and other diseases of the circulatory system: Secondary | ICD-10-CM | POA: Diagnosis not present

## 2020-01-20 DIAGNOSIS — Z950 Presence of cardiac pacemaker: Secondary | ICD-10-CM | POA: Diagnosis not present

## 2020-01-20 MED ORDER — AZACITIDINE CHEMO SQ INJECTION
75.0000 mg/m2 | Freq: Once | INTRAMUSCULAR | Status: AC
  Administered 2020-01-20: 152.5 mg via SUBCUTANEOUS
  Filled 2020-01-20: qty 6.1

## 2020-01-20 MED ORDER — ONDANSETRON HCL 8 MG PO TABS: 8.0000 mg | ORAL_TABLET | Freq: Once | ORAL | Status: DC

## 2020-01-20 NOTE — Progress Notes (Signed)
PT STABLE AT TIME OF DISCHARGE 

## 2020-01-20 NOTE — Patient Instructions (Signed)
Azacitidine suspension for injection (subcutaneous use) What is this medicine? AZACITIDINE (ay za SITE i deen) is a chemotherapy drug. This medicine reduces the growth of cancer cells and can suppress the immune system. It is used for treating myelodysplastic syndrome or some types of leukemia. This medicine may be used for other purposes; ask your health care provider or pharmacist if you have questions. COMMON BRAND NAME(S): Vidaza What should I tell my health care provider before I take this medicine? They need to know if you have any of these conditions:  kidney disease  liver disease  liver tumors  an unusual or allergic reaction to azacitidine, mannitol, other medicines, foods, dyes, or preservatives  pregnant or trying to get pregnant  breast-feeding How should I use this medicine? This medicine is for injection under the skin. It is administered in a hospital or clinic by a specially trained health care professional. Talk to your pediatrician regarding the use of this medicine in children. While this drug may be prescribed for selected conditions, precautions do apply. Overdosage: If you think you have taken too much of this medicine contact a poison control center or emergency room at once. NOTE: This medicine is only for you. Do not share this medicine with others. What if I miss a dose? It is important not to miss your dose. Call your doctor or health care professional if you are unable to keep an appointment. What may interact with this medicine? Interactions have not been studied. Give your health care provider a list of all the medicines, herbs, non-prescription drugs, or dietary supplements you use. Also tell them if you smoke, drink alcohol, or use illegal drugs. Some items may interact with your medicine. This list may not describe all possible interactions. Give your health care provider a list of all the medicines, herbs, non-prescription drugs, or dietary supplements  you use. Also tell them if you smoke, drink alcohol, or use illegal drugs. Some items may interact with your medicine. What should I watch for while using this medicine? Visit your doctor for checks on your progress. This drug may make you feel generally unwell. This is not uncommon, as chemotherapy can affect healthy cells as well as cancer cells. Report any side effects. Continue your course of treatment even though you feel ill unless your doctor tells you to stop. In some cases, you may be given additional medicines to help with side effects. Follow all directions for their use. Call your doctor or health care professional for advice if you get a fever, chills or sore throat, or other symptoms of a cold or flu. Do not treat yourself. This drug decreases your body's ability to fight infections. Try to avoid being around people who are sick. This medicine may increase your risk to bruise or bleed. Call your doctor or health care professional if you notice any unusual bleeding. You may need blood work done while you are taking this medicine. Do not become pregnant while taking this medicine and for 6 months after the last dose. Women should inform their doctor if they wish to become pregnant or think they might be pregnant. Men should not father a child while taking this medicine and for 3 months after the last dose. There is a potential for serious side effects to an unborn child. Talk to your health care professional or pharmacist for more information. Do not breast-feed an infant while taking this medicine and for 1 week after the last dose. This medicine may interfere with   the ability to have a child. Talk with your doctor or health care professional if you are concerned about your fertility. What side effects may I notice from receiving this medicine? Side effects that you should report to your doctor or health care professional as soon as possible:  allergic reactions like skin rash, itching or  hives, swelling of the face, lips, or tongue  low blood counts - this medicine may decrease the number of white blood cells, red blood cells and platelets. You may be at increased risk for infections and bleeding.  signs of infection - fever or chills, cough, sore throat, pain passing urine  signs of decreased platelets or bleeding - bruising, pinpoint red spots on the skin, black, tarry stools, blood in the urine  signs of decreased red blood cells - unusually weak or tired, fainting spells, lightheadedness  signs and symptoms of kidney injury like trouble passing urine or change in the amount of urine  signs and symptoms of liver injury like dark yellow or brown urine; general ill feeling or flu-like symptoms; light-colored stools; loss of appetite; nausea; right upper belly pain; unusually weak or tired; yellowing of the eyes or skin Side effects that usually do not require medical attention (report to your doctor or health care professional if they continue or are bothersome):  constipation  diarrhea  nausea, vomiting  pain or redness at the injection site  unusually weak or tired This list may not describe all possible side effects. Call your doctor for medical advice about side effects. You may report side effects to FDA at 1-800-FDA-1088. Where should I keep my medicine? This drug is given in a hospital or clinic and will not be stored at home. NOTE: This sheet is a summary. It may not cover all possible information. If you have questions about this medicine, talk to your doctor, pharmacist, or health care provider.  2020 Elsevier/Gold Standard (2016-02-01 14:37:51)  

## 2020-01-21 ENCOUNTER — Inpatient Hospital Stay: Payer: Medicare HMO

## 2020-01-21 ENCOUNTER — Other Ambulatory Visit: Payer: Self-pay

## 2020-01-21 VITALS — BP 123/55 | HR 78 | Temp 97.7°F | Resp 18 | Ht 61.0 in | Wt 206.2 lb

## 2020-01-21 DIAGNOSIS — E119 Type 2 diabetes mellitus without complications: Secondary | ICD-10-CM | POA: Diagnosis not present

## 2020-01-21 DIAGNOSIS — I10 Essential (primary) hypertension: Secondary | ICD-10-CM | POA: Diagnosis not present

## 2020-01-21 DIAGNOSIS — D4621 Refractory anemia with excess of blasts 1: Secondary | ICD-10-CM | POA: Diagnosis not present

## 2020-01-21 DIAGNOSIS — K589 Irritable bowel syndrome without diarrhea: Secondary | ICD-10-CM | POA: Diagnosis not present

## 2020-01-21 DIAGNOSIS — Z8673 Personal history of transient ischemic attack (TIA), and cerebral infarction without residual deficits: Secondary | ICD-10-CM | POA: Diagnosis not present

## 2020-01-21 DIAGNOSIS — Z5111 Encounter for antineoplastic chemotherapy: Secondary | ICD-10-CM | POA: Diagnosis not present

## 2020-01-21 DIAGNOSIS — I4891 Unspecified atrial fibrillation: Secondary | ICD-10-CM | POA: Diagnosis not present

## 2020-01-21 DIAGNOSIS — D46Z Other myelodysplastic syndromes: Secondary | ICD-10-CM

## 2020-01-21 DIAGNOSIS — K219 Gastro-esophageal reflux disease without esophagitis: Secondary | ICD-10-CM | POA: Diagnosis not present

## 2020-01-21 DIAGNOSIS — E039 Hypothyroidism, unspecified: Secondary | ICD-10-CM | POA: Diagnosis not present

## 2020-01-21 MED ORDER — AZACITIDINE CHEMO SQ INJECTION
75.0000 mg/m2 | Freq: Once | INTRAMUSCULAR | Status: AC
Start: 2020-01-21 — End: 2020-01-21
  Administered 2020-01-21: 152.5 mg via SUBCUTANEOUS
  Filled 2020-01-21: qty 6.1

## 2020-01-21 MED ORDER — ONDANSETRON HCL 8 MG PO TABS: 8.0000 mg | ORAL_TABLET | Freq: Once | ORAL | Status: DC

## 2020-01-21 NOTE — Patient Instructions (Signed)
Azacitidine suspension for injection (subcutaneous use) What is this medicine? AZACITIDINE (ay za SITE i deen) is a chemotherapy drug. This medicine reduces the growth of cancer cells and can suppress the immune system. It is used for treating myelodysplastic syndrome or some types of leukemia. This medicine may be used for other purposes; ask your health care provider or pharmacist if you have questions. COMMON BRAND NAME(S): Vidaza What should I tell my health care provider before I take this medicine? They need to know if you have any of these conditions:  kidney disease  liver disease  liver tumors  an unusual or allergic reaction to azacitidine, mannitol, other medicines, foods, dyes, or preservatives  pregnant or trying to get pregnant  breast-feeding How should I use this medicine? This medicine is for injection under the skin. It is administered in a hospital or clinic by a specially trained health care professional. Talk to your pediatrician regarding the use of this medicine in children. While this drug may be prescribed for selected conditions, precautions do apply. Overdosage: If you think you have taken too much of this medicine contact a poison control center or emergency room at once. NOTE: This medicine is only for you. Do not share this medicine with others. What if I miss a dose? It is important not to miss your dose. Call your doctor or health care professional if you are unable to keep an appointment. What may interact with this medicine? Interactions have not been studied. Give your health care provider a list of all the medicines, herbs, non-prescription drugs, or dietary supplements you use. Also tell them if you smoke, drink alcohol, or use illegal drugs. Some items may interact with your medicine. This list may not describe all possible interactions. Give your health care provider a list of all the medicines, herbs, non-prescription drugs, or dietary supplements  you use. Also tell them if you smoke, drink alcohol, or use illegal drugs. Some items may interact with your medicine. What should I watch for while using this medicine? Visit your doctor for checks on your progress. This drug may make you feel generally unwell. This is not uncommon, as chemotherapy can affect healthy cells as well as cancer cells. Report any side effects. Continue your course of treatment even though you feel ill unless your doctor tells you to stop. In some cases, you may be given additional medicines to help with side effects. Follow all directions for their use. Call your doctor or health care professional for advice if you get a fever, chills or sore throat, or other symptoms of a cold or flu. Do not treat yourself. This drug decreases your body's ability to fight infections. Try to avoid being around people who are sick. This medicine may increase your risk to bruise or bleed. Call your doctor or health care professional if you notice any unusual bleeding. You may need blood work done while you are taking this medicine. Do not become pregnant while taking this medicine and for 6 months after the last dose. Women should inform their doctor if they wish to become pregnant or think they might be pregnant. Men should not father a child while taking this medicine and for 3 months after the last dose. There is a potential for serious side effects to an unborn child. Talk to your health care professional or pharmacist for more information. Do not breast-feed an infant while taking this medicine and for 1 week after the last dose. This medicine may interfere with   the ability to have a child. Talk with your doctor or health care professional if you are concerned about your fertility. What side effects may I notice from receiving this medicine? Side effects that you should report to your doctor or health care professional as soon as possible:  allergic reactions like skin rash, itching or  hives, swelling of the face, lips, or tongue  low blood counts - this medicine may decrease the number of white blood cells, red blood cells and platelets. You may be at increased risk for infections and bleeding.  signs of infection - fever or chills, cough, sore throat, pain passing urine  signs of decreased platelets or bleeding - bruising, pinpoint red spots on the skin, black, tarry stools, blood in the urine  signs of decreased red blood cells - unusually weak or tired, fainting spells, lightheadedness  signs and symptoms of kidney injury like trouble passing urine or change in the amount of urine  signs and symptoms of liver injury like dark yellow or brown urine; general ill feeling or flu-like symptoms; light-colored stools; loss of appetite; nausea; right upper belly pain; unusually weak or tired; yellowing of the eyes or skin Side effects that usually do not require medical attention (report to your doctor or health care professional if they continue or are bothersome):  constipation  diarrhea  nausea, vomiting  pain or redness at the injection site  unusually weak or tired This list may not describe all possible side effects. Call your doctor for medical advice about side effects. You may report side effects to FDA at 1-800-FDA-1088. Where should I keep my medicine? This drug is given in a hospital or clinic and will not be stored at home. NOTE: This sheet is a summary. It may not cover all possible information. If you have questions about this medicine, talk to your doctor, pharmacist, or health care provider.  2020 Elsevier/Gold Standard (2016-02-01 14:37:51)  

## 2020-01-21 NOTE — Progress Notes (Signed)
PT STABLE AT TIME OF DISCHARGE 

## 2020-01-22 ENCOUNTER — Inpatient Hospital Stay: Payer: Medicare HMO

## 2020-01-22 VITALS — BP 156/67 | HR 63 | Temp 97.5°F | Resp 18 | Ht 61.0 in | Wt 206.8 lb

## 2020-01-22 DIAGNOSIS — E039 Hypothyroidism, unspecified: Secondary | ICD-10-CM | POA: Diagnosis not present

## 2020-01-22 DIAGNOSIS — Z7901 Long term (current) use of anticoagulants: Secondary | ICD-10-CM | POA: Diagnosis not present

## 2020-01-22 DIAGNOSIS — K589 Irritable bowel syndrome without diarrhea: Secondary | ICD-10-CM | POA: Diagnosis not present

## 2020-01-22 DIAGNOSIS — I110 Hypertensive heart disease with heart failure: Secondary | ICD-10-CM | POA: Diagnosis not present

## 2020-01-22 DIAGNOSIS — L89153 Pressure ulcer of sacral region, stage 3: Secondary | ICD-10-CM | POA: Diagnosis not present

## 2020-01-22 DIAGNOSIS — I872 Venous insufficiency (chronic) (peripheral): Secondary | ICD-10-CM | POA: Diagnosis not present

## 2020-01-22 DIAGNOSIS — Z8673 Personal history of transient ischemic attack (TIA), and cerebral infarction without residual deficits: Secondary | ICD-10-CM | POA: Diagnosis not present

## 2020-01-22 DIAGNOSIS — E119 Type 2 diabetes mellitus without complications: Secondary | ICD-10-CM | POA: Diagnosis not present

## 2020-01-22 DIAGNOSIS — D4621 Refractory anemia with excess of blasts 1: Secondary | ICD-10-CM | POA: Diagnosis not present

## 2020-01-22 DIAGNOSIS — M6281 Muscle weakness (generalized): Secondary | ICD-10-CM | POA: Diagnosis not present

## 2020-01-22 DIAGNOSIS — I10 Essential (primary) hypertension: Secondary | ICD-10-CM | POA: Diagnosis not present

## 2020-01-22 DIAGNOSIS — I509 Heart failure, unspecified: Secondary | ICD-10-CM | POA: Diagnosis not present

## 2020-01-22 DIAGNOSIS — E1151 Type 2 diabetes mellitus with diabetic peripheral angiopathy without gangrene: Secondary | ICD-10-CM | POA: Diagnosis not present

## 2020-01-22 DIAGNOSIS — Z5111 Encounter for antineoplastic chemotherapy: Secondary | ICD-10-CM | POA: Diagnosis not present

## 2020-01-22 DIAGNOSIS — K219 Gastro-esophageal reflux disease without esophagitis: Secondary | ICD-10-CM | POA: Diagnosis not present

## 2020-01-22 DIAGNOSIS — Z7984 Long term (current) use of oral hypoglycemic drugs: Secondary | ICD-10-CM | POA: Diagnosis not present

## 2020-01-22 DIAGNOSIS — I4891 Unspecified atrial fibrillation: Secondary | ICD-10-CM | POA: Diagnosis not present

## 2020-01-22 DIAGNOSIS — D46Z Other myelodysplastic syndromes: Secondary | ICD-10-CM

## 2020-01-22 MED ORDER — ONDANSETRON HCL 8 MG PO TABS: 8.0000 mg | ORAL_TABLET | Freq: Once | ORAL | Status: DC

## 2020-01-22 MED ORDER — AZACITIDINE CHEMO SQ INJECTION
75.0000 mg/m2 | Freq: Once | INTRAMUSCULAR | Status: AC
  Administered 2020-01-22: 152.5 mg via SUBCUTANEOUS
  Filled 2020-01-22: qty 6.1

## 2020-01-22 NOTE — Patient Instructions (Signed)
Azacitidine suspension for injection (subcutaneous use) What is this medicine? AZACITIDINE (ay za SITE i deen) is a chemotherapy drug. This medicine reduces the growth of cancer cells and can suppress the immune system. It is used for treating myelodysplastic syndrome or some types of leukemia. This medicine may be used for other purposes; ask your health care provider or pharmacist if you have questions. COMMON BRAND NAME(S): Vidaza What should I tell my health care provider before I take this medicine? They need to know if you have any of these conditions:  kidney disease  liver disease  liver tumors  an unusual or allergic reaction to azacitidine, mannitol, other medicines, foods, dyes, or preservatives  pregnant or trying to get pregnant  breast-feeding How should I use this medicine? This medicine is for injection under the skin. It is administered in a hospital or clinic by a specially trained health care professional. Talk to your pediatrician regarding the use of this medicine in children. While this drug may be prescribed for selected conditions, precautions do apply. Overdosage: If you think you have taken too much of this medicine contact a poison control center or emergency room at once. NOTE: This medicine is only for you. Do not share this medicine with others. What if I miss a dose? It is important not to miss your dose. Call your doctor or health care professional if you are unable to keep an appointment. What may interact with this medicine? Interactions have not been studied. Give your health care provider a list of all the medicines, herbs, non-prescription drugs, or dietary supplements you use. Also tell them if you smoke, drink alcohol, or use illegal drugs. Some items may interact with your medicine. This list may not describe all possible interactions. Give your health care provider a list of all the medicines, herbs, non-prescription drugs, or dietary supplements  you use. Also tell them if you smoke, drink alcohol, or use illegal drugs. Some items may interact with your medicine. What should I watch for while using this medicine? Visit your doctor for checks on your progress. This drug may make you feel generally unwell. This is not uncommon, as chemotherapy can affect healthy cells as well as cancer cells. Report any side effects. Continue your course of treatment even though you feel ill unless your doctor tells you to stop. In some cases, you may be given additional medicines to help with side effects. Follow all directions for their use. Call your doctor or health care professional for advice if you get a fever, chills or sore throat, or other symptoms of a cold or flu. Do not treat yourself. This drug decreases your body's ability to fight infections. Try to avoid being around people who are sick. This medicine may increase your risk to bruise or bleed. Call your doctor or health care professional if you notice any unusual bleeding. You may need blood work done while you are taking this medicine. Do not become pregnant while taking this medicine and for 6 months after the last dose. Women should inform their doctor if they wish to become pregnant or think they might be pregnant. Men should not father a child while taking this medicine and for 3 months after the last dose. There is a potential for serious side effects to an unborn child. Talk to your health care professional or pharmacist for more information. Do not breast-feed an infant while taking this medicine and for 1 week after the last dose. This medicine may interfere with   the ability to have a child. Talk with your doctor or health care professional if you are concerned about your fertility. What side effects may I notice from receiving this medicine? Side effects that you should report to your doctor or health care professional as soon as possible:  allergic reactions like skin rash, itching or  hives, swelling of the face, lips, or tongue  low blood counts - this medicine may decrease the number of white blood cells, red blood cells and platelets. You may be at increased risk for infections and bleeding.  signs of infection - fever or chills, cough, sore throat, pain passing urine  signs of decreased platelets or bleeding - bruising, pinpoint red spots on the skin, black, tarry stools, blood in the urine  signs of decreased red blood cells - unusually weak or tired, fainting spells, lightheadedness  signs and symptoms of kidney injury like trouble passing urine or change in the amount of urine  signs and symptoms of liver injury like dark yellow or brown urine; general ill feeling or flu-like symptoms; light-colored stools; loss of appetite; nausea; right upper belly pain; unusually weak or tired; yellowing of the eyes or skin Side effects that usually do not require medical attention (report to your doctor or health care professional if they continue or are bothersome):  constipation  diarrhea  nausea, vomiting  pain or redness at the injection site  unusually weak or tired This list may not describe all possible side effects. Call your doctor for medical advice about side effects. You may report side effects to FDA at 1-800-FDA-1088. Where should I keep my medicine? This drug is given in a hospital or clinic and will not be stored at home. NOTE: This sheet is a summary. It may not cover all possible information. If you have questions about this medicine, talk to your doctor, pharmacist, or health care provider.  2020 Elsevier/Gold Standard (2016-02-01 14:37:51)  

## 2020-01-22 NOTE — Progress Notes (Signed)
PT STABLE AT TIME OF DISCHARGE 

## 2020-01-23 ENCOUNTER — Inpatient Hospital Stay: Payer: Medicare HMO

## 2020-01-23 ENCOUNTER — Other Ambulatory Visit: Payer: Self-pay

## 2020-01-23 VITALS — BP 129/59 | HR 81 | Temp 97.8°F | Resp 18 | Ht 61.0 in | Wt 207.8 lb

## 2020-01-23 DIAGNOSIS — K589 Irritable bowel syndrome without diarrhea: Secondary | ICD-10-CM | POA: Diagnosis not present

## 2020-01-23 DIAGNOSIS — K219 Gastro-esophageal reflux disease without esophagitis: Secondary | ICD-10-CM | POA: Diagnosis not present

## 2020-01-23 DIAGNOSIS — D4621 Refractory anemia with excess of blasts 1: Secondary | ICD-10-CM | POA: Diagnosis not present

## 2020-01-23 DIAGNOSIS — I4891 Unspecified atrial fibrillation: Secondary | ICD-10-CM | POA: Diagnosis not present

## 2020-01-23 DIAGNOSIS — Z5111 Encounter for antineoplastic chemotherapy: Secondary | ICD-10-CM | POA: Diagnosis not present

## 2020-01-23 DIAGNOSIS — D46Z Other myelodysplastic syndromes: Secondary | ICD-10-CM

## 2020-01-23 DIAGNOSIS — E119 Type 2 diabetes mellitus without complications: Secondary | ICD-10-CM | POA: Diagnosis not present

## 2020-01-23 DIAGNOSIS — Z8673 Personal history of transient ischemic attack (TIA), and cerebral infarction without residual deficits: Secondary | ICD-10-CM | POA: Diagnosis not present

## 2020-01-23 DIAGNOSIS — I10 Essential (primary) hypertension: Secondary | ICD-10-CM | POA: Diagnosis not present

## 2020-01-23 DIAGNOSIS — E039 Hypothyroidism, unspecified: Secondary | ICD-10-CM | POA: Diagnosis not present

## 2020-01-23 MED ORDER — AZACITIDINE CHEMO SQ INJECTION
75.0000 mg/m2 | Freq: Once | INTRAMUSCULAR | Status: AC
  Administered 2020-01-23: 152.5 mg via SUBCUTANEOUS
  Filled 2020-01-23: qty 6.1

## 2020-01-23 MED ORDER — ONDANSETRON HCL 8 MG PO TABS: 8.0000 mg | ORAL_TABLET | Freq: Once | ORAL | Status: DC

## 2020-01-23 NOTE — Progress Notes (Signed)
PT STABLE AT TIME OF DISCHARGE 

## 2020-01-23 NOTE — Patient Instructions (Signed)
Azacitidine suspension for injection (subcutaneous use) What is this medicine? AZACITIDINE (ay za SITE i deen) is a chemotherapy drug. This medicine reduces the growth of cancer cells and can suppress the immune system. It is used for treating myelodysplastic syndrome or some types of leukemia. This medicine may be used for other purposes; ask your health care provider or pharmacist if you have questions. COMMON BRAND NAME(S): Vidaza What should I tell my health care provider before I take this medicine? They need to know if you have any of these conditions:  kidney disease  liver disease  liver tumors  an unusual or allergic reaction to azacitidine, mannitol, other medicines, foods, dyes, or preservatives  pregnant or trying to get pregnant  breast-feeding How should I use this medicine? This medicine is for injection under the skin. It is administered in a hospital or clinic by a specially trained health care professional. Talk to your pediatrician regarding the use of this medicine in children. While this drug may be prescribed for selected conditions, precautions do apply. Overdosage: If you think you have taken too much of this medicine contact a poison control center or emergency room at once. NOTE: This medicine is only for you. Do not share this medicine with others. What if I miss a dose? It is important not to miss your dose. Call your doctor or health care professional if you are unable to keep an appointment. What may interact with this medicine? Interactions have not been studied. Give your health care provider a list of all the medicines, herbs, non-prescription drugs, or dietary supplements you use. Also tell them if you smoke, drink alcohol, or use illegal drugs. Some items may interact with your medicine. This list may not describe all possible interactions. Give your health care provider a list of all the medicines, herbs, non-prescription drugs, or dietary supplements  you use. Also tell them if you smoke, drink alcohol, or use illegal drugs. Some items may interact with your medicine. What should I watch for while using this medicine? Visit your doctor for checks on your progress. This drug may make you feel generally unwell. This is not uncommon, as chemotherapy can affect healthy cells as well as cancer cells. Report any side effects. Continue your course of treatment even though you feel ill unless your doctor tells you to stop. In some cases, you may be given additional medicines to help with side effects. Follow all directions for their use. Call your doctor or health care professional for advice if you get a fever, chills or sore throat, or other symptoms of a cold or flu. Do not treat yourself. This drug decreases your body's ability to fight infections. Try to avoid being around people who are sick. This medicine may increase your risk to bruise or bleed. Call your doctor or health care professional if you notice any unusual bleeding. You may need blood work done while you are taking this medicine. Do not become pregnant while taking this medicine and for 6 months after the last dose. Women should inform their doctor if they wish to become pregnant or think they might be pregnant. Men should not father a child while taking this medicine and for 3 months after the last dose. There is a potential for serious side effects to an unborn child. Talk to your health care professional or pharmacist for more information. Do not breast-feed an infant while taking this medicine and for 1 week after the last dose. This medicine may interfere with   the ability to have a child. Talk with your doctor or health care professional if you are concerned about your fertility. What side effects may I notice from receiving this medicine? Side effects that you should report to your doctor or health care professional as soon as possible:  allergic reactions like skin rash, itching or  hives, swelling of the face, lips, or tongue  low blood counts - this medicine may decrease the number of white blood cells, red blood cells and platelets. You may be at increased risk for infections and bleeding.  signs of infection - fever or chills, cough, sore throat, pain passing urine  signs of decreased platelets or bleeding - bruising, pinpoint red spots on the skin, black, tarry stools, blood in the urine  signs of decreased red blood cells - unusually weak or tired, fainting spells, lightheadedness  signs and symptoms of kidney injury like trouble passing urine or change in the amount of urine  signs and symptoms of liver injury like dark yellow or brown urine; general ill feeling or flu-like symptoms; light-colored stools; loss of appetite; nausea; right upper belly pain; unusually weak or tired; yellowing of the eyes or skin Side effects that usually do not require medical attention (report to your doctor or health care professional if they continue or are bothersome):  constipation  diarrhea  nausea, vomiting  pain or redness at the injection site  unusually weak or tired This list may not describe all possible side effects. Call your doctor for medical advice about side effects. You may report side effects to FDA at 1-800-FDA-1088. Where should I keep my medicine? This drug is given in a hospital or clinic and will not be stored at home. NOTE: This sheet is a summary. It may not cover all possible information. If you have questions about this medicine, talk to your doctor, pharmacist, or health care provider.  2020 Elsevier/Gold Standard (2016-02-01 14:37:51)  

## 2020-01-26 ENCOUNTER — Inpatient Hospital Stay: Payer: Medicare HMO

## 2020-01-26 ENCOUNTER — Other Ambulatory Visit: Payer: Self-pay

## 2020-01-26 VITALS — BP 155/70 | HR 76 | Temp 97.5°F | Resp 18 | Wt 204.0 lb

## 2020-01-26 DIAGNOSIS — K589 Irritable bowel syndrome without diarrhea: Secondary | ICD-10-CM | POA: Diagnosis not present

## 2020-01-26 DIAGNOSIS — D4621 Refractory anemia with excess of blasts 1: Secondary | ICD-10-CM | POA: Diagnosis not present

## 2020-01-26 DIAGNOSIS — Z8673 Personal history of transient ischemic attack (TIA), and cerebral infarction without residual deficits: Secondary | ICD-10-CM | POA: Diagnosis not present

## 2020-01-26 DIAGNOSIS — E119 Type 2 diabetes mellitus without complications: Secondary | ICD-10-CM | POA: Diagnosis not present

## 2020-01-26 DIAGNOSIS — E039 Hypothyroidism, unspecified: Secondary | ICD-10-CM | POA: Diagnosis not present

## 2020-01-26 DIAGNOSIS — I4891 Unspecified atrial fibrillation: Secondary | ICD-10-CM | POA: Diagnosis not present

## 2020-01-26 DIAGNOSIS — I10 Essential (primary) hypertension: Secondary | ICD-10-CM | POA: Diagnosis not present

## 2020-01-26 DIAGNOSIS — Z5111 Encounter for antineoplastic chemotherapy: Secondary | ICD-10-CM | POA: Diagnosis not present

## 2020-01-26 DIAGNOSIS — K219 Gastro-esophageal reflux disease without esophagitis: Secondary | ICD-10-CM | POA: Diagnosis not present

## 2020-01-26 DIAGNOSIS — D46Z Other myelodysplastic syndromes: Secondary | ICD-10-CM

## 2020-01-26 MED ORDER — AZACITIDINE CHEMO SQ INJECTION
75.0000 mg/m2 | Freq: Once | INTRAMUSCULAR | Status: AC
  Administered 2020-01-26: 152.5 mg via SUBCUTANEOUS
  Filled 2020-01-26: qty 6.1

## 2020-01-26 MED ORDER — ONDANSETRON HCL 8 MG PO TABS: 8.0000 mg | ORAL_TABLET | Freq: Once | ORAL | Status: DC

## 2020-01-26 NOTE — Patient Instructions (Signed)
Azacitidine suspension for injection (subcutaneous use) What is this medicine? AZACITIDINE (ay za SITE i deen) is a chemotherapy drug. This medicine reduces the growth of cancer cells and can suppress the immune system. It is used for treating myelodysplastic syndrome or some types of leukemia. This medicine may be used for other purposes; ask your health care provider or pharmacist if you have questions. COMMON BRAND NAME(S): Vidaza What should I tell my health care provider before I take this medicine? They need to know if you have any of these conditions:  kidney disease  liver disease  liver tumors  an unusual or allergic reaction to azacitidine, mannitol, other medicines, foods, dyes, or preservatives  pregnant or trying to get pregnant  breast-feeding How should I use this medicine? This medicine is for injection under the skin. It is administered in a hospital or clinic by a specially trained health care professional. Talk to your pediatrician regarding the use of this medicine in children. While this drug may be prescribed for selected conditions, precautions do apply. Overdosage: If you think you have taken too much of this medicine contact a poison control center or emergency room at once. NOTE: This medicine is only for you. Do not share this medicine with others. What if I miss a dose? It is important not to miss your dose. Call your doctor or health care professional if you are unable to keep an appointment. What may interact with this medicine? Interactions have not been studied. Give your health care provider a list of all the medicines, herbs, non-prescription drugs, or dietary supplements you use. Also tell them if you smoke, drink alcohol, or use illegal drugs. Some items may interact with your medicine. This list may not describe all possible interactions. Give your health care provider a list of all the medicines, herbs, non-prescription drugs, or dietary supplements  you use. Also tell them if you smoke, drink alcohol, or use illegal drugs. Some items may interact with your medicine. What should I watch for while using this medicine? Visit your doctor for checks on your progress. This drug may make you feel generally unwell. This is not uncommon, as chemotherapy can affect healthy cells as well as cancer cells. Report any side effects. Continue your course of treatment even though you feel ill unless your doctor tells you to stop. In some cases, you may be given additional medicines to help with side effects. Follow all directions for their use. Call your doctor or health care professional for advice if you get a fever, chills or sore throat, or other symptoms of a cold or flu. Do not treat yourself. This drug decreases your body's ability to fight infections. Try to avoid being around people who are sick. This medicine may increase your risk to bruise or bleed. Call your doctor or health care professional if you notice any unusual bleeding. You may need blood work done while you are taking this medicine. Do not become pregnant while taking this medicine and for 6 months after the last dose. Women should inform their doctor if they wish to become pregnant or think they might be pregnant. Men should not father a child while taking this medicine and for 3 months after the last dose. There is a potential for serious side effects to an unborn child. Talk to your health care professional or pharmacist for more information. Do not breast-feed an infant while taking this medicine and for 1 week after the last dose. This medicine may interfere with   the ability to have a child. Talk with your doctor or health care professional if you are concerned about your fertility. What side effects may I notice from receiving this medicine? Side effects that you should report to your doctor or health care professional as soon as possible:  allergic reactions like skin rash, itching or  hives, swelling of the face, lips, or tongue  low blood counts - this medicine may decrease the number of white blood cells, red blood cells and platelets. You may be at increased risk for infections and bleeding.  signs of infection - fever or chills, cough, sore throat, pain passing urine  signs of decreased platelets or bleeding - bruising, pinpoint red spots on the skin, black, tarry stools, blood in the urine  signs of decreased red blood cells - unusually weak or tired, fainting spells, lightheadedness  signs and symptoms of kidney injury like trouble passing urine or change in the amount of urine  signs and symptoms of liver injury like dark yellow or brown urine; general ill feeling or flu-like symptoms; light-colored stools; loss of appetite; nausea; right upper belly pain; unusually weak or tired; yellowing of the eyes or skin Side effects that usually do not require medical attention (report to your doctor or health care professional if they continue or are bothersome):  constipation  diarrhea  nausea, vomiting  pain or redness at the injection site  unusually weak or tired This list may not describe all possible side effects. Call your doctor for medical advice about side effects. You may report side effects to FDA at 1-800-FDA-1088. Where should I keep my medicine? This drug is given in a hospital or clinic and will not be stored at home. NOTE: This sheet is a summary. It may not cover all possible information. If you have questions about this medicine, talk to your doctor, pharmacist, or health care provider.  2021 Elsevier/Gold Standard (2016-02-01 14:37:51)  

## 2020-01-26 NOTE — Progress Notes (Signed)
PT STABLE AT TIME OF DISCHARGE 

## 2020-01-27 ENCOUNTER — Inpatient Hospital Stay: Payer: Medicare HMO

## 2020-01-27 VITALS — BP 176/73 | HR 93 | Temp 97.6°F | Resp 18 | Ht 61.0 in | Wt 205.2 lb

## 2020-01-27 DIAGNOSIS — I872 Venous insufficiency (chronic) (peripheral): Secondary | ICD-10-CM | POA: Diagnosis not present

## 2020-01-27 DIAGNOSIS — Z8673 Personal history of transient ischemic attack (TIA), and cerebral infarction without residual deficits: Secondary | ICD-10-CM | POA: Diagnosis not present

## 2020-01-27 DIAGNOSIS — M6281 Muscle weakness (generalized): Secondary | ICD-10-CM | POA: Diagnosis not present

## 2020-01-27 DIAGNOSIS — Z7984 Long term (current) use of oral hypoglycemic drugs: Secondary | ICD-10-CM | POA: Diagnosis not present

## 2020-01-27 DIAGNOSIS — I110 Hypertensive heart disease with heart failure: Secondary | ICD-10-CM | POA: Diagnosis not present

## 2020-01-27 DIAGNOSIS — E1151 Type 2 diabetes mellitus with diabetic peripheral angiopathy without gangrene: Secondary | ICD-10-CM | POA: Diagnosis not present

## 2020-01-27 DIAGNOSIS — I10 Essential (primary) hypertension: Secondary | ICD-10-CM | POA: Diagnosis not present

## 2020-01-27 DIAGNOSIS — E039 Hypothyroidism, unspecified: Secondary | ICD-10-CM | POA: Diagnosis not present

## 2020-01-27 DIAGNOSIS — D46Z Other myelodysplastic syndromes: Secondary | ICD-10-CM

## 2020-01-27 DIAGNOSIS — L89153 Pressure ulcer of sacral region, stage 3: Secondary | ICD-10-CM | POA: Diagnosis not present

## 2020-01-27 DIAGNOSIS — K589 Irritable bowel syndrome without diarrhea: Secondary | ICD-10-CM | POA: Diagnosis not present

## 2020-01-27 DIAGNOSIS — D4621 Refractory anemia with excess of blasts 1: Secondary | ICD-10-CM | POA: Diagnosis not present

## 2020-01-27 DIAGNOSIS — E119 Type 2 diabetes mellitus without complications: Secondary | ICD-10-CM | POA: Diagnosis not present

## 2020-01-27 DIAGNOSIS — K219 Gastro-esophageal reflux disease without esophagitis: Secondary | ICD-10-CM | POA: Diagnosis not present

## 2020-01-27 DIAGNOSIS — Z7901 Long term (current) use of anticoagulants: Secondary | ICD-10-CM | POA: Diagnosis not present

## 2020-01-27 DIAGNOSIS — Z5111 Encounter for antineoplastic chemotherapy: Secondary | ICD-10-CM | POA: Diagnosis not present

## 2020-01-27 DIAGNOSIS — I509 Heart failure, unspecified: Secondary | ICD-10-CM | POA: Diagnosis not present

## 2020-01-27 DIAGNOSIS — I4891 Unspecified atrial fibrillation: Secondary | ICD-10-CM | POA: Diagnosis not present

## 2020-01-27 MED ORDER — ONDANSETRON HCL 8 MG PO TABS: 8.0000 mg | ORAL_TABLET | Freq: Once | ORAL | Status: DC

## 2020-01-27 MED ORDER — AZACITIDINE CHEMO SQ INJECTION
75.0000 mg/m2 | Freq: Once | INTRAMUSCULAR | Status: AC
  Administered 2020-01-27: 152.5 mg via SUBCUTANEOUS
  Filled 2020-01-27: qty 6.1

## 2020-01-27 NOTE — Patient Instructions (Signed)
Azacitidine suspension for injection (subcutaneous use) What is this medicine? AZACITIDINE (ay za SITE i deen) is a chemotherapy drug. This medicine reduces the growth of cancer cells and can suppress the immune system. It is used for treating myelodysplastic syndrome or some types of leukemia. This medicine may be used for other purposes; ask your health care provider or pharmacist if you have questions. COMMON BRAND NAME(S): Vidaza What should I tell my health care provider before I take this medicine? They need to know if you have any of these conditions:  kidney disease  liver disease  liver tumors  an unusual or allergic reaction to azacitidine, mannitol, other medicines, foods, dyes, or preservatives  pregnant or trying to get pregnant  breast-feeding How should I use this medicine? This medicine is for injection under the skin. It is administered in a hospital or clinic by a specially trained health care professional. Talk to your pediatrician regarding the use of this medicine in children. While this drug may be prescribed for selected conditions, precautions do apply. Overdosage: If you think you have taken too much of this medicine contact a poison control center or emergency room at once. NOTE: This medicine is only for you. Do not share this medicine with others. What if I miss a dose? It is important not to miss your dose. Call your doctor or health care professional if you are unable to keep an appointment. What may interact with this medicine? Interactions have not been studied. Give your health care provider a list of all the medicines, herbs, non-prescription drugs, or dietary supplements you use. Also tell them if you smoke, drink alcohol, or use illegal drugs. Some items may interact with your medicine. This list may not describe all possible interactions. Give your health care provider a list of all the medicines, herbs, non-prescription drugs, or dietary supplements  you use. Also tell them if you smoke, drink alcohol, or use illegal drugs. Some items may interact with your medicine. What should I watch for while using this medicine? Visit your doctor for checks on your progress. This drug may make you feel generally unwell. This is not uncommon, as chemotherapy can affect healthy cells as well as cancer cells. Report any side effects. Continue your course of treatment even though you feel ill unless your doctor tells you to stop. In some cases, you may be given additional medicines to help with side effects. Follow all directions for their use. Call your doctor or health care professional for advice if you get a fever, chills or sore throat, or other symptoms of a cold or flu. Do not treat yourself. This drug decreases your body's ability to fight infections. Try to avoid being around people who are sick. This medicine may increase your risk to bruise or bleed. Call your doctor or health care professional if you notice any unusual bleeding. You may need blood work done while you are taking this medicine. Do not become pregnant while taking this medicine and for 6 months after the last dose. Women should inform their doctor if they wish to become pregnant or think they might be pregnant. Men should not father a child while taking this medicine and for 3 months after the last dose. There is a potential for serious side effects to an unborn child. Talk to your health care professional or pharmacist for more information. Do not breast-feed an infant while taking this medicine and for 1 week after the last dose. This medicine may interfere with   the ability to have a child. Talk with your doctor or health care professional if you are concerned about your fertility. What side effects may I notice from receiving this medicine? Side effects that you should report to your doctor or health care professional as soon as possible:  allergic reactions like skin rash, itching or  hives, swelling of the face, lips, or tongue  low blood counts - this medicine may decrease the number of white blood cells, red blood cells and platelets. You may be at increased risk for infections and bleeding.  signs of infection - fever or chills, cough, sore throat, pain passing urine  signs of decreased platelets or bleeding - bruising, pinpoint red spots on the skin, black, tarry stools, blood in the urine  signs of decreased red blood cells - unusually weak or tired, fainting spells, lightheadedness  signs and symptoms of kidney injury like trouble passing urine or change in the amount of urine  signs and symptoms of liver injury like dark yellow or brown urine; general ill feeling or flu-like symptoms; light-colored stools; loss of appetite; nausea; right upper belly pain; unusually weak or tired; yellowing of the eyes or skin Side effects that usually do not require medical attention (report to your doctor or health care professional if they continue or are bothersome):  constipation  diarrhea  nausea, vomiting  pain or redness at the injection site  unusually weak or tired This list may not describe all possible side effects. Call your doctor for medical advice about side effects. You may report side effects to FDA at 1-800-FDA-1088. Where should I keep my medicine? This drug is given in a hospital or clinic and will not be stored at home. NOTE: This sheet is a summary. It may not cover all possible information. If you have questions about this medicine, talk to your doctor, pharmacist, or health care provider.  2021 Elsevier/Gold Standard (2016-02-01 14:37:51)  

## 2020-01-28 ENCOUNTER — Other Ambulatory Visit: Payer: Self-pay

## 2020-01-28 ENCOUNTER — Inpatient Hospital Stay: Payer: Medicare HMO

## 2020-01-28 VITALS — BP 145/65 | HR 71 | Temp 98.0°F | Resp 18 | Ht 61.0 in | Wt 204.2 lb

## 2020-01-28 DIAGNOSIS — E039 Hypothyroidism, unspecified: Secondary | ICD-10-CM | POA: Diagnosis not present

## 2020-01-28 DIAGNOSIS — I10 Essential (primary) hypertension: Secondary | ICD-10-CM | POA: Diagnosis not present

## 2020-01-28 DIAGNOSIS — E119 Type 2 diabetes mellitus without complications: Secondary | ICD-10-CM | POA: Diagnosis not present

## 2020-01-28 DIAGNOSIS — Z5111 Encounter for antineoplastic chemotherapy: Secondary | ICD-10-CM | POA: Diagnosis not present

## 2020-01-28 DIAGNOSIS — I4891 Unspecified atrial fibrillation: Secondary | ICD-10-CM | POA: Diagnosis not present

## 2020-01-28 DIAGNOSIS — K219 Gastro-esophageal reflux disease without esophagitis: Secondary | ICD-10-CM | POA: Diagnosis not present

## 2020-01-28 DIAGNOSIS — Z8673 Personal history of transient ischemic attack (TIA), and cerebral infarction without residual deficits: Secondary | ICD-10-CM | POA: Diagnosis not present

## 2020-01-28 DIAGNOSIS — K589 Irritable bowel syndrome without diarrhea: Secondary | ICD-10-CM | POA: Diagnosis not present

## 2020-01-28 DIAGNOSIS — D46Z Other myelodysplastic syndromes: Secondary | ICD-10-CM

## 2020-01-28 DIAGNOSIS — D4621 Refractory anemia with excess of blasts 1: Secondary | ICD-10-CM | POA: Diagnosis not present

## 2020-01-28 MED ORDER — AZACITIDINE CHEMO SQ INJECTION
75.0000 mg/m2 | Freq: Once | INTRAMUSCULAR | Status: AC
  Administered 2020-01-28: 152.5 mg via SUBCUTANEOUS
  Filled 2020-01-28: qty 6.1

## 2020-01-28 MED ORDER — ONDANSETRON HCL 8 MG PO TABS: 8.0000 mg | ORAL_TABLET | Freq: Once | ORAL | Status: DC

## 2020-01-28 NOTE — Patient Instructions (Signed)
Azacitidine suspension for injection (subcutaneous use) What is this medicine? AZACITIDINE (ay za SITE i deen) is a chemotherapy drug. This medicine reduces the growth of cancer cells and can suppress the immune system. It is used for treating myelodysplastic syndrome or some types of leukemia. This medicine may be used for other purposes; ask your health care provider or pharmacist if you have questions. COMMON BRAND NAME(S): Vidaza What should I tell my health care provider before I take this medicine? They need to know if you have any of these conditions:  kidney disease  liver disease  liver tumors  an unusual or allergic reaction to azacitidine, mannitol, other medicines, foods, dyes, or preservatives  pregnant or trying to get pregnant  breast-feeding How should I use this medicine? This medicine is for injection under the skin. It is administered in a hospital or clinic by a specially trained health care professional. Talk to your pediatrician regarding the use of this medicine in children. While this drug may be prescribed for selected conditions, precautions do apply. Overdosage: If you think you have taken too much of this medicine contact a poison control center or emergency room at once. NOTE: This medicine is only for you. Do not share this medicine with others. What if I miss a dose? It is important not to miss your dose. Call your doctor or health care professional if you are unable to keep an appointment. What may interact with this medicine? Interactions have not been studied. Give your health care provider a list of all the medicines, herbs, non-prescription drugs, or dietary supplements you use. Also tell them if you smoke, drink alcohol, or use illegal drugs. Some items may interact with your medicine. This list may not describe all possible interactions. Give your health care provider a list of all the medicines, herbs, non-prescription drugs, or dietary supplements  you use. Also tell them if you smoke, drink alcohol, or use illegal drugs. Some items may interact with your medicine. What should I watch for while using this medicine? Visit your doctor for checks on your progress. This drug may make you feel generally unwell. This is not uncommon, as chemotherapy can affect healthy cells as well as cancer cells. Report any side effects. Continue your course of treatment even though you feel ill unless your doctor tells you to stop. In some cases, you may be given additional medicines to help with side effects. Follow all directions for their use. Call your doctor or health care professional for advice if you get a fever, chills or sore throat, or other symptoms of a cold or flu. Do not treat yourself. This drug decreases your body's ability to fight infections. Try to avoid being around people who are sick. This medicine may increase your risk to bruise or bleed. Call your doctor or health care professional if you notice any unusual bleeding. You may need blood work done while you are taking this medicine. Do not become pregnant while taking this medicine and for 6 months after the last dose. Women should inform their doctor if they wish to become pregnant or think they might be pregnant. Men should not father a child while taking this medicine and for 3 months after the last dose. There is a potential for serious side effects to an unborn child. Talk to your health care professional or pharmacist for more information. Do not breast-feed an infant while taking this medicine and for 1 week after the last dose. This medicine may interfere with   the ability to have a child. Talk with your doctor or health care professional if you are concerned about your fertility. What side effects may I notice from receiving this medicine? Side effects that you should report to your doctor or health care professional as soon as possible:  allergic reactions like skin rash, itching or  hives, swelling of the face, lips, or tongue  low blood counts - this medicine may decrease the number of white blood cells, red blood cells and platelets. You may be at increased risk for infections and bleeding.  signs of infection - fever or chills, cough, sore throat, pain passing urine  signs of decreased platelets or bleeding - bruising, pinpoint red spots on the skin, black, tarry stools, blood in the urine  signs of decreased red blood cells - unusually weak or tired, fainting spells, lightheadedness  signs and symptoms of kidney injury like trouble passing urine or change in the amount of urine  signs and symptoms of liver injury like dark yellow or brown urine; general ill feeling or flu-like symptoms; light-colored stools; loss of appetite; nausea; right upper belly pain; unusually weak or tired; yellowing of the eyes or skin Side effects that usually do not require medical attention (report to your doctor or health care professional if they continue or are bothersome):  constipation  diarrhea  nausea, vomiting  pain or redness at the injection site  unusually weak or tired This list may not describe all possible side effects. Call your doctor for medical advice about side effects. You may report side effects to FDA at 1-800-FDA-1088. Where should I keep my medicine? This drug is given in a hospital or clinic and will not be stored at home. NOTE: This sheet is a summary. It may not cover all possible information. If you have questions about this medicine, talk to your doctor, pharmacist, or health care provider.  2021 Elsevier/Gold Standard (2016-02-01 14:37:51)  

## 2020-01-29 DIAGNOSIS — E1151 Type 2 diabetes mellitus with diabetic peripheral angiopathy without gangrene: Secondary | ICD-10-CM | POA: Diagnosis not present

## 2020-01-29 DIAGNOSIS — I872 Venous insufficiency (chronic) (peripheral): Secondary | ICD-10-CM | POA: Diagnosis not present

## 2020-01-29 DIAGNOSIS — Z7901 Long term (current) use of anticoagulants: Secondary | ICD-10-CM | POA: Diagnosis not present

## 2020-01-29 DIAGNOSIS — I110 Hypertensive heart disease with heart failure: Secondary | ICD-10-CM | POA: Diagnosis not present

## 2020-01-29 DIAGNOSIS — I509 Heart failure, unspecified: Secondary | ICD-10-CM | POA: Diagnosis not present

## 2020-01-29 DIAGNOSIS — M6281 Muscle weakness (generalized): Secondary | ICD-10-CM | POA: Diagnosis not present

## 2020-01-29 DIAGNOSIS — L89153 Pressure ulcer of sacral region, stage 3: Secondary | ICD-10-CM | POA: Diagnosis not present

## 2020-01-29 DIAGNOSIS — I4891 Unspecified atrial fibrillation: Secondary | ICD-10-CM | POA: Diagnosis not present

## 2020-01-29 DIAGNOSIS — Z7984 Long term (current) use of oral hypoglycemic drugs: Secondary | ICD-10-CM | POA: Diagnosis not present

## 2020-01-30 DIAGNOSIS — M6281 Muscle weakness (generalized): Secondary | ICD-10-CM | POA: Diagnosis not present

## 2020-01-30 DIAGNOSIS — L89153 Pressure ulcer of sacral region, stage 3: Secondary | ICD-10-CM | POA: Diagnosis not present

## 2020-01-30 DIAGNOSIS — I4891 Unspecified atrial fibrillation: Secondary | ICD-10-CM | POA: Diagnosis not present

## 2020-01-30 DIAGNOSIS — I509 Heart failure, unspecified: Secondary | ICD-10-CM | POA: Diagnosis not present

## 2020-01-30 DIAGNOSIS — E1151 Type 2 diabetes mellitus with diabetic peripheral angiopathy without gangrene: Secondary | ICD-10-CM | POA: Diagnosis not present

## 2020-01-30 DIAGNOSIS — I872 Venous insufficiency (chronic) (peripheral): Secondary | ICD-10-CM | POA: Diagnosis not present

## 2020-01-30 DIAGNOSIS — I110 Hypertensive heart disease with heart failure: Secondary | ICD-10-CM | POA: Diagnosis not present

## 2020-01-30 DIAGNOSIS — Z7984 Long term (current) use of oral hypoglycemic drugs: Secondary | ICD-10-CM | POA: Diagnosis not present

## 2020-01-30 DIAGNOSIS — Z7901 Long term (current) use of anticoagulants: Secondary | ICD-10-CM | POA: Diagnosis not present

## 2020-02-01 DIAGNOSIS — I4891 Unspecified atrial fibrillation: Secondary | ICD-10-CM | POA: Diagnosis not present

## 2020-02-01 DIAGNOSIS — I509 Heart failure, unspecified: Secondary | ICD-10-CM | POA: Diagnosis not present

## 2020-02-01 DIAGNOSIS — Z7901 Long term (current) use of anticoagulants: Secondary | ICD-10-CM | POA: Diagnosis not present

## 2020-02-01 DIAGNOSIS — M6281 Muscle weakness (generalized): Secondary | ICD-10-CM | POA: Diagnosis not present

## 2020-02-01 DIAGNOSIS — I872 Venous insufficiency (chronic) (peripheral): Secondary | ICD-10-CM | POA: Diagnosis not present

## 2020-02-01 DIAGNOSIS — E1151 Type 2 diabetes mellitus with diabetic peripheral angiopathy without gangrene: Secondary | ICD-10-CM | POA: Diagnosis not present

## 2020-02-01 DIAGNOSIS — L89153 Pressure ulcer of sacral region, stage 3: Secondary | ICD-10-CM | POA: Diagnosis not present

## 2020-02-01 DIAGNOSIS — Z7984 Long term (current) use of oral hypoglycemic drugs: Secondary | ICD-10-CM | POA: Diagnosis not present

## 2020-02-01 DIAGNOSIS — L110 Acquired keratosis follicularis: Secondary | ICD-10-CM | POA: Diagnosis not present

## 2020-02-03 DIAGNOSIS — L89153 Pressure ulcer of sacral region, stage 3: Secondary | ICD-10-CM | POA: Diagnosis not present

## 2020-02-03 DIAGNOSIS — E1151 Type 2 diabetes mellitus with diabetic peripheral angiopathy without gangrene: Secondary | ICD-10-CM | POA: Diagnosis not present

## 2020-02-03 DIAGNOSIS — I4891 Unspecified atrial fibrillation: Secondary | ICD-10-CM | POA: Diagnosis not present

## 2020-02-03 DIAGNOSIS — Z7984 Long term (current) use of oral hypoglycemic drugs: Secondary | ICD-10-CM | POA: Diagnosis not present

## 2020-02-03 DIAGNOSIS — M6281 Muscle weakness (generalized): Secondary | ICD-10-CM | POA: Diagnosis not present

## 2020-02-03 DIAGNOSIS — I110 Hypertensive heart disease with heart failure: Secondary | ICD-10-CM | POA: Diagnosis not present

## 2020-02-03 DIAGNOSIS — I509 Heart failure, unspecified: Secondary | ICD-10-CM | POA: Diagnosis not present

## 2020-02-03 DIAGNOSIS — I872 Venous insufficiency (chronic) (peripheral): Secondary | ICD-10-CM | POA: Diagnosis not present

## 2020-02-03 DIAGNOSIS — Z7901 Long term (current) use of anticoagulants: Secondary | ICD-10-CM | POA: Diagnosis not present

## 2020-02-04 DIAGNOSIS — D485 Neoplasm of uncertain behavior of skin: Secondary | ICD-10-CM | POA: Diagnosis not present

## 2020-02-04 DIAGNOSIS — L299 Pruritus, unspecified: Secondary | ICD-10-CM | POA: Diagnosis not present

## 2020-02-04 DIAGNOSIS — L97921 Non-pressure chronic ulcer of unspecified part of left lower leg limited to breakdown of skin: Secondary | ICD-10-CM | POA: Diagnosis not present

## 2020-02-04 DIAGNOSIS — L02416 Cutaneous abscess of left lower limb: Secondary | ICD-10-CM | POA: Diagnosis not present

## 2020-02-05 DIAGNOSIS — I110 Hypertensive heart disease with heart failure: Secondary | ICD-10-CM | POA: Diagnosis not present

## 2020-02-05 DIAGNOSIS — I872 Venous insufficiency (chronic) (peripheral): Secondary | ICD-10-CM | POA: Diagnosis not present

## 2020-02-05 DIAGNOSIS — M6281 Muscle weakness (generalized): Secondary | ICD-10-CM | POA: Diagnosis not present

## 2020-02-05 DIAGNOSIS — E1151 Type 2 diabetes mellitus with diabetic peripheral angiopathy without gangrene: Secondary | ICD-10-CM | POA: Diagnosis not present

## 2020-02-05 DIAGNOSIS — I4891 Unspecified atrial fibrillation: Secondary | ICD-10-CM | POA: Diagnosis not present

## 2020-02-05 DIAGNOSIS — Z7984 Long term (current) use of oral hypoglycemic drugs: Secondary | ICD-10-CM | POA: Diagnosis not present

## 2020-02-05 DIAGNOSIS — L89153 Pressure ulcer of sacral region, stage 3: Secondary | ICD-10-CM | POA: Diagnosis not present

## 2020-02-05 DIAGNOSIS — I509 Heart failure, unspecified: Secondary | ICD-10-CM | POA: Diagnosis not present

## 2020-02-05 DIAGNOSIS — Z7901 Long term (current) use of anticoagulants: Secondary | ICD-10-CM | POA: Diagnosis not present

## 2020-02-05 NOTE — Progress Notes (Signed)
Called and spoke with patient about applying for co-pay assistance through [facility]. Patient was approved, ID# [id number]. 01/06/20-01/04/21

## 2020-02-10 DIAGNOSIS — Z7901 Long term (current) use of anticoagulants: Secondary | ICD-10-CM | POA: Diagnosis not present

## 2020-02-10 DIAGNOSIS — M6281 Muscle weakness (generalized): Secondary | ICD-10-CM | POA: Diagnosis not present

## 2020-02-10 DIAGNOSIS — I4891 Unspecified atrial fibrillation: Secondary | ICD-10-CM | POA: Diagnosis not present

## 2020-02-10 DIAGNOSIS — I872 Venous insufficiency (chronic) (peripheral): Secondary | ICD-10-CM | POA: Diagnosis not present

## 2020-02-10 DIAGNOSIS — Z7984 Long term (current) use of oral hypoglycemic drugs: Secondary | ICD-10-CM | POA: Diagnosis not present

## 2020-02-10 DIAGNOSIS — I509 Heart failure, unspecified: Secondary | ICD-10-CM | POA: Diagnosis not present

## 2020-02-10 DIAGNOSIS — E1151 Type 2 diabetes mellitus with diabetic peripheral angiopathy without gangrene: Secondary | ICD-10-CM | POA: Diagnosis not present

## 2020-02-10 DIAGNOSIS — I110 Hypertensive heart disease with heart failure: Secondary | ICD-10-CM | POA: Diagnosis not present

## 2020-02-10 DIAGNOSIS — L89153 Pressure ulcer of sacral region, stage 3: Secondary | ICD-10-CM | POA: Diagnosis not present

## 2020-02-10 DIAGNOSIS — L309 Dermatitis, unspecified: Secondary | ICD-10-CM | POA: Diagnosis not present

## 2020-02-10 DIAGNOSIS — L299 Pruritus, unspecified: Secondary | ICD-10-CM | POA: Diagnosis not present

## 2020-02-11 DIAGNOSIS — I4891 Unspecified atrial fibrillation: Secondary | ICD-10-CM | POA: Diagnosis not present

## 2020-02-11 DIAGNOSIS — Z7984 Long term (current) use of oral hypoglycemic drugs: Secondary | ICD-10-CM | POA: Diagnosis not present

## 2020-02-11 DIAGNOSIS — L89153 Pressure ulcer of sacral region, stage 3: Secondary | ICD-10-CM | POA: Diagnosis not present

## 2020-02-11 DIAGNOSIS — M6281 Muscle weakness (generalized): Secondary | ICD-10-CM | POA: Diagnosis not present

## 2020-02-11 DIAGNOSIS — Z7901 Long term (current) use of anticoagulants: Secondary | ICD-10-CM | POA: Diagnosis not present

## 2020-02-11 DIAGNOSIS — E1151 Type 2 diabetes mellitus with diabetic peripheral angiopathy without gangrene: Secondary | ICD-10-CM | POA: Diagnosis not present

## 2020-02-11 DIAGNOSIS — I110 Hypertensive heart disease with heart failure: Secondary | ICD-10-CM | POA: Diagnosis not present

## 2020-02-11 DIAGNOSIS — I872 Venous insufficiency (chronic) (peripheral): Secondary | ICD-10-CM | POA: Diagnosis not present

## 2020-02-11 DIAGNOSIS — I509 Heart failure, unspecified: Secondary | ICD-10-CM | POA: Diagnosis not present

## 2020-02-12 DIAGNOSIS — I110 Hypertensive heart disease with heart failure: Secondary | ICD-10-CM | POA: Diagnosis not present

## 2020-02-12 DIAGNOSIS — I509 Heart failure, unspecified: Secondary | ICD-10-CM | POA: Diagnosis not present

## 2020-02-12 DIAGNOSIS — E1151 Type 2 diabetes mellitus with diabetic peripheral angiopathy without gangrene: Secondary | ICD-10-CM | POA: Diagnosis not present

## 2020-02-12 DIAGNOSIS — I4891 Unspecified atrial fibrillation: Secondary | ICD-10-CM | POA: Diagnosis not present

## 2020-02-12 DIAGNOSIS — I872 Venous insufficiency (chronic) (peripheral): Secondary | ICD-10-CM | POA: Diagnosis not present

## 2020-02-12 DIAGNOSIS — Z7901 Long term (current) use of anticoagulants: Secondary | ICD-10-CM | POA: Diagnosis not present

## 2020-02-12 DIAGNOSIS — Z7984 Long term (current) use of oral hypoglycemic drugs: Secondary | ICD-10-CM | POA: Diagnosis not present

## 2020-02-12 DIAGNOSIS — L89153 Pressure ulcer of sacral region, stage 3: Secondary | ICD-10-CM | POA: Diagnosis not present

## 2020-02-12 DIAGNOSIS — M6281 Muscle weakness (generalized): Secondary | ICD-10-CM | POA: Diagnosis not present

## 2020-02-16 ENCOUNTER — Encounter: Payer: Self-pay | Admitting: Hematology and Oncology

## 2020-02-16 ENCOUNTER — Other Ambulatory Visit: Payer: Self-pay

## 2020-02-16 ENCOUNTER — Inpatient Hospital Stay: Payer: Medicare HMO

## 2020-02-16 ENCOUNTER — Inpatient Hospital Stay: Payer: Medicare HMO | Admitting: Hematology and Oncology

## 2020-02-16 ENCOUNTER — Telehealth: Payer: Self-pay | Admitting: Oncology

## 2020-02-16 VITALS — BP 150/78 | HR 82 | Temp 98.4°F | Resp 18 | Ht 61.0 in | Wt 205.8 lb

## 2020-02-16 DIAGNOSIS — I110 Hypertensive heart disease with heart failure: Secondary | ICD-10-CM | POA: Diagnosis not present

## 2020-02-16 DIAGNOSIS — E1151 Type 2 diabetes mellitus with diabetic peripheral angiopathy without gangrene: Secondary | ICD-10-CM | POA: Diagnosis not present

## 2020-02-16 DIAGNOSIS — I4891 Unspecified atrial fibrillation: Secondary | ICD-10-CM | POA: Diagnosis not present

## 2020-02-16 DIAGNOSIS — Z7984 Long term (current) use of oral hypoglycemic drugs: Secondary | ICD-10-CM | POA: Diagnosis not present

## 2020-02-16 DIAGNOSIS — D46Z Other myelodysplastic syndromes: Secondary | ICD-10-CM | POA: Diagnosis not present

## 2020-02-16 DIAGNOSIS — L89153 Pressure ulcer of sacral region, stage 3: Secondary | ICD-10-CM | POA: Diagnosis not present

## 2020-02-16 DIAGNOSIS — M6281 Muscle weakness (generalized): Secondary | ICD-10-CM | POA: Diagnosis not present

## 2020-02-16 DIAGNOSIS — Z7901 Long term (current) use of anticoagulants: Secondary | ICD-10-CM | POA: Diagnosis not present

## 2020-02-16 DIAGNOSIS — I872 Venous insufficiency (chronic) (peripheral): Secondary | ICD-10-CM | POA: Diagnosis not present

## 2020-02-16 DIAGNOSIS — I509 Heart failure, unspecified: Secondary | ICD-10-CM | POA: Diagnosis not present

## 2020-02-16 LAB — BASIC METABOLIC PANEL
BUN: 31 — AB (ref 4–21)
CO2: 24 — AB (ref 13–22)
Chloride: 105 (ref 99–108)
Creatinine: 1 (ref 0.5–1.1)
Glucose: 140
Potassium: 4.6 (ref 3.4–5.3)
Sodium: 137 (ref 137–147)

## 2020-02-16 LAB — COMPREHENSIVE METABOLIC PANEL: Calcium: 9.5 (ref 8.7–10.7)

## 2020-02-16 LAB — CBC
Absolute Lymphocytes: 0.47 — AB (ref 0.65–4.75)
MCV: 108 — AB (ref 81–99)
RBC: 2.93 — AB (ref 3.87–5.11)

## 2020-02-16 LAB — CBC AND DIFFERENTIAL
HCT: 32 — AB (ref 36–46)
Hemoglobin: 10.2 — AB (ref 12.0–16.0)
Neutrophils Absolute: 0.56
Platelets: 383 (ref 150–399)
WBC: 1.2

## 2020-02-16 NOTE — Progress Notes (Signed)
Sent in request for DOS 02/19/2020-02/25/2020 to [employer].

## 2020-02-16 NOTE — Progress Notes (Signed)
[facility]  [address] [place],  [place]  [address] [phone]  Clinic Day:  02/16/2020  Referring physician: Philemon Kingdom, MD   CHIEF COMPLAINT:  CC: Myelodysplasia-excess blasts-1  Current Treatment:   Azacytidine days 1 through 7 every 28 days   HISTORY OF PRESENT ILLNESS:  [name] is a 80 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  Her bone marrow biopsy revealed 5-6% blasts present.  She has completed 3 cycles of azacitidine with improvement in her hemoglobin and has not required a blood transfusion in over 2 months. Prior to her 3rd cycle of azacytidine, she was found to have a violaceous lesion of the left thigh concerning for leukemia cutis.  We referred her for biopsy of this lesion.  She was also referred for physical therapy due to generalized weakness.  INTERVAL HISTORY:  [name] is here today prior to a 4th cycle of azacitidine. She claims to have tolerated her 3rd cycle of treatment well and states she is feeling stronger than previous. She has started home physical therapy.  She states she had biopsy of 2 skin lesions of the left thigh and was told these were not malignant, however, a culture is pending.  She sees the dermatologist again tomorrow.  The biopsy sites are not completely healed, but have overlying eschar.  She also has 2 pressure sores being managed by home health.  They are scheduled to change her dressings today and she did not want me to remove the dressings. She denies fevers or chills. She  denies pain. Her appetite  Has improved. Her weight has been stable.  REVIEW OF SYSTEMS:  Review of Systems  Constitutional: Negative for appetite change, chills, fatigue, fever and unexpected weight change.  HENT:   Negative for lump/mass, mouth sores and sore throat.   Respiratory: Negative for cough and shortness of breath.   Cardiovascular: Negative for chest pain and leg swelling.  Gastrointestinal:  Negative for abdominal pain, constipation, diarrhea, nausea and vomiting.  Genitourinary: Negative for difficulty urinating, dysuria, frequency and hematuria.   Musculoskeletal: Negative for arthralgias, back pain and myalgias.  Skin: Positive for wound (2 biopsy sites left leg). Negative for rash.  Neurological: Negative for dizziness and headaches.  Psychiatric/Behavioral: Negative for decreased concentration. The patient is not nervous/anxious.      VITALS:  Blood pressure (!) 150/78, pulse 82, temperature 98.4 F (36.9 C), temperature source Oral, resp. rate 18, height 5\' 1"  (1.549 m), weight 205 lb 12.8 oz (93.4 kg), SpO2 94 %.  Wt Readings from Last 3 Encounters:  02/16/20 205 lb 12.8 oz (93.4 kg)  01/28/20 204 lb 4 oz (92.6 kg)  01/27/20 205 lb 4 oz (93.1 kg)    Body mass index is 38.89 kg/m.  Performance status (ECOG): 2 - Symptomatic, <50% confined to bed  PHYSICAL EXAM:  Physical Exam Vitals and nursing note reviewed.  HENT:     Head: Normocephalic and atraumatic.     Mouth/Throat:     Mouth: Mucous membranes are moist.     Pharynx: Oropharynx is clear. No oropharyngeal exudate or posterior oropharyngeal erythema.  Eyes:     General: No scleral icterus.    Extraocular Movements: Extraocular movements intact.     Conjunctiva/sclera: Conjunctivae normal.     Pupils: Pupils are equal, round, and reactive to light.  Cardiovascular:     Rate and Rhythm: Normal rate and regular rhythm.     Heart sounds: Normal heart sounds. No murmur heard.  No friction rub. No gallop.   Pulmonary:     Effort: No respiratory distress.     Breath sounds: Normal breath sounds. No stridor. No wheezing, rhonchi or rales.  Chest:  Breasts:     Right: No axillary adenopathy or supraclavicular adenopathy.     Left: No axillary adenopathy or supraclavicular adenopathy.    Abdominal:     General: There is no distension.     Palpations: Abdomen is soft. There is no hepatomegaly, splenomegaly  or mass.     Tenderness: There is abdominal tenderness (injection sites). There is no guarding.     Hernia: No hernia is present.  Musculoskeletal:     Cervical back: Normal range of motion and neck supple. No tenderness.     Right lower leg: No edema.     Left lower leg: No edema.  Lymphadenopathy:     Cervical: No cervical adenopathy.     Upper Body:     Right upper body: No supraclavicular or axillary adenopathy.     Left upper body: No supraclavicular or axillary adenopathy.     Lower Body: No right inguinal adenopathy. No left inguinal adenopathy.  Skin:    General: Skin is warm and dry.     Coloration: Skin is jaundiced.     Findings: Lesion (left inner thigh, dressed, left lateral thigh, overlying eschar) present. No rash.  Neurological:     Mental Status: She is oriented to person, place, and time.     Cranial Nerves: No cranial nerve deficit.  Psychiatric:        Mood and Affect: Mood normal.        Behavior: Behavior normal.        Thought Content: Thought content normal.     LABS:   CBC Latest Ref Rng & Units 02/16/2020 01/19/2020 01/13/2020  WBC - 1.2 2.5 0.5  Hemoglobin 12.0 - 16.0 10.2(A) 9.5(A) 9.1(A)  Hematocrit 36 - 46 32(A) 29(A) 28(A)  Platelets 150 - 399 383 344 381   CMP Latest Ref Rng & Units 02/16/2020 01/19/2020 01/13/2020  Glucose 65 - 99 mg/dL - - -  BUN 4 - 21 31(A) 38(A) 37(A)  Creatinine 0.5 - 1.1 1.0 1.3(A) 1.2(A)  Sodium 137 - 147 137 136(A) 139  Potassium 3.4 - 5.3 4.6 4.2 4.3  Chloride 99 - 108 105 103 104  CO2 13 - 22 24(A) 20 26(A)  Calcium 8.7 - 10.7 9.5 8.5(A) 8.9  Alkaline Phos 25 - 125 - - -  AST 13 - 35 - - -  ALT 7 - 35 - - -     No results found for: CEA1 / No results found for: CEA1 No results found for: PSA1 No results found for: KZS010 No results found for: XNA355  No results found for: TOTALPROTELP, ALBUMINELP, A1GS, A2GS, BETS, BETA2SER, GAMS, MSPIKE, SPEI No results found for: TIBC, FERRITIN, IRONPCTSAT No results found  for: LDH  STUDIES:  No results found.    HISTORY:   Past Medical History:  Diagnosis Date  . Arthritis    SPURS, DDD, NECK  . Diabetes mellitus without complication [facility])    dx  2008 ?  Marland Kitchen Dysrhythmia    afib ([place], Dr. Bettina Gavia)  . Edema    legs , left leg weeping, is wrapped 10/28/13  . GERD (gastroesophageal reflux disease)   . H/O hiatal hernia   . Headache    h/o 'muscular vascular' headaches  . Hypertension   . Hypothyroidism   .  Mucous colitis   . Pacemaker   . Phlebitis    hx of  . Pulmonary hypertension ([place])   . Shortness of breath    due to pulmonary hypeertension  . Stroke [facility])    1998    Past Surgical History:  Procedure Laterality Date  . [place] VITRECTOMY WITH 20 GAUGE MVR PORT FOR MACULAR HOLE Left 11/12/2012   Procedure: 25 GAUGE PARS PLANA VITRECTOMY WITH 20 GAUGE MVR PORT FOR MACULAR HOLE;  Surgeon: Hayden Pedro, MD;  Location: [place];  Service: Ophthalmology;  Laterality: Left;  . ABDOMINAL HYSTERECTOMY  1977  . CATARACT EXTRACTION W/PHACO Left 10/29/2013   Procedure: LEFT CATARACT EXTRACTION PHACO AND INTRAOCULAR LENS PLACEMENT (IOC);  Surgeon: [name], MD;  Location: [place];  Service: Ophthalmology;  Laterality: Left;  . CHOLECYSTECTOMY  1983  . EYE SURGERY    . GAS INSERTION Left 11/12/2012   Procedure: INSERTION OF GAS;  Surgeon: Hayden Pedro, MD;  Location: [place];  Service: Ophthalmology;  Laterality: Left;  C3F8 (expires 11/2014)  . INSERT / REPLACE / REMOVE PACEMAKER     2008, DR BRIAN MUNLEY/Dr. Minna Merritts  . KNEE ARTHROSCOPY     1990S  RT   . MEMBRANE PEEL Left 11/12/2012   Procedure: MEMBRANE PEEL;  Surgeon: Hayden Pedro, MD;  Location: [place];  Service: Ophthalmology;  Laterality: Left;  . OSTECTOMY Right 09/04/2016   Procedure: OSTECTOMY, COMPLETE METATARSAL HEAD FIFTH RIGHT;  Surgeon: Landis Martins, DPM;  Location: [place];  Service: Podiatry;  Laterality: Right;  . PARS PLANA VITRECTOMY Left 11/12/2012    with macular hole    Dr Zigmund Daniel  . PHOTOCOAGULATION WITH LASER Left 11/12/2012   Procedure: HEADSCOPE LASER;  Surgeon: Hayden Pedro, MD;  Location: [place];  Service: Ophthalmology;  Laterality: Left;  . SERUM PATCH Left 11/12/2012   Procedure: SERUM PATCH;  Surgeon: Hayden Pedro, MD;  Location: [place];  Service: Ophthalmology;  Laterality: Left;  . TONSILLECTOMY      Family History  Problem Relation Age of Onset  . Heart attack Father   . Hypertension Father     Social History:  reports that she has never smoked. She has never used smokeless tobacco. She reports that she does not drink alcohol and does not use drugs.The patient is accompanied by her daughter, Jenny Reichmann, today.  Allergies: No Known Allergies  Current Medications: Current Outpatient Medications  Medication Sig Dispense Refill  . benazepril (LOTENSIN) 5 MG tablet Take 1 tablet by mouth daily.    . bumetanide (BUMEX) 2 MG tablet Take 1 tablet by mouth 2 (two) times daily.    Marland Kitchen gabapentin (NEURONTIN) 800 MG tablet Take 1 tablet by mouth daily.    Marland Kitchen glucose blood (ACCU-CHEK AVIVA PLUS) test strip     . potassium chloride (MICRO-K) 10 MEQ CR capsule Take by mouth.    Marland Kitchen acetaminophen (TYLENOL) 500 MG tablet Take 1,000 mg by mouth at bedtime as needed (knee pain).     Marland Kitchen allopurinol (ZYLOPRIM) 100 MG tablet Take 100 mg by mouth daily.    Marland Kitchen ALPRAZolam (XANAX) 0.25 MG tablet Take 0.25 mg by mouth at bedtime.    Marland Kitchen aspirin 81 MG chewable tablet Chew by mouth.    . cephALEXin (KEFLEX) 500 MG capsule Take 1 capsule (500 mg total) by mouth 2 (two) times daily. 14 capsule 0  . Cholecalciferol (VITAMIN D) 2000 UNITS CAPS Take 2,000 Units by mouth daily.    Marland Kitchen  docusate sodium (COLACE) 100 MG capsule Take 300 mg by mouth at bedtime.    Marland Kitchen estradiol (ESTRACE) 0.5 MG tablet Take 0.5 mg by mouth daily.    Marland Kitchen estradiol (ESTRACE) 1 MG tablet     . FLUZONE HIGH-DOSE QUADRIVALENT 0.7 ML SUSY     . furosemide (LASIX) 40 MG tablet TAKE 1 TABLET  TWICE DAILY 180 tablet 1  . glipiZIDE (GLUCOTROL XL) 5 MG 24 hr tablet Take by mouth.    Marland Kitchen HYDROcodone-acetaminophen (NORCO) 5-325 MG tablet Take 1-2 tablets by mouth every 4 (four) hours as needed for moderate pain. 120 tablet 0  . Lactobacillus (ACIDOPHILUS) CAPS capsule Take 1 capsule by mouth daily.    Marland Kitchen levothyroxine (SYNTHROID, LEVOTHROID) 112 MCG tablet Take 112 mcg by mouth daily before breakfast.     . lovastatin (MEVACOR) 40 MG tablet Take 40 mg by mouth 2 (two) times daily.    . metFORMIN (GLUCOPHAGE) 500 MG tablet Take by mouth.    . metolazone (ZAROXOLYN) 2.5 MG tablet Take 1 tablet by mouth twice weekly on Tuesday and Friday. 8 tablet 3  . metoprolol succinate (TOPROL-XL) 50 MG 24 hr tablet Take 50 mg by mouth daily. Take with or immediately following a meal.    . omeprazole (PRILOSEC) 20 MG capsule Take 20 mg by mouth daily.    . ondansetron (ZOFRAN) 4 MG tablet Take 1 tablet (4 mg total) by mouth every 4 (four) hours as needed for nausea. 90 tablet 3  . polyethylene glycol (MIRALAX / GLYCOLAX) 17 g packet Take 17 g by mouth daily.    . prochlorperazine (COMPAZINE) 10 MG tablet Take 1 tablet (10 mg total) by mouth every 6 (six) hours as needed for nausea or vomiting. 90 tablet 3  . tiZANidine (ZANAFLEX) 4 MG capsule Take by mouth.    . warfarin (COUMADIN) 3 MG tablet Take 3-6 mg by mouth daily at 6 PM. Take 2 tablets (6 mg) by mouth on Tuesday, Thursday, Saturday, and Sunday and take 1 tablet (3 mg) on Monday, Wednesday, and Friday     No current facility-administered medications for this visit.     ASSESSMENT & PLAN:   Assessment/Plan:  [name] is a 80 y.o. female  with myelodysplasia-excess blasts -1. We continue to be pleased as her hemoglobin has been rising without needing a blood transfusion.  This suggests her bone marrow is responding to her azacitidine.  The patient asks what would happen if she discontinue treatment at this time and I explained that she would  have recurrent anemia and worsening neutropenia if we discontinue treatment before her disease is well controlled.  After discussion with Dr. Bobby Rumpf, she will proceed with her 4th cycle of azacitidine this week.  The biopsy of the violaceous lesion of her left thigh was apparently negative for malignancy, but culture is pending.  She sees a dermatologist again tomorrow. The patient and her daughter understand the plans discussed today and are in agreement with them. They know to contact our office if she develops concerns prior to her next appointment.    Marvia Pickles, PA-C

## 2020-02-16 NOTE — Telephone Encounter (Signed)
Per 1/31 los next appt sched and given to patient 

## 2020-02-17 ENCOUNTER — Inpatient Hospital Stay: Payer: Medicare HMO | Attending: Oncology

## 2020-02-17 ENCOUNTER — Telehealth: Payer: Self-pay | Admitting: Internal Medicine

## 2020-02-17 VITALS — BP 123/55 | HR 87 | Temp 98.5°F | Resp 20 | Ht 61.0 in | Wt 205.0 lb

## 2020-02-17 DIAGNOSIS — D4621 Refractory anemia with excess of blasts 1: Secondary | ICD-10-CM | POA: Diagnosis not present

## 2020-02-17 DIAGNOSIS — I10 Essential (primary) hypertension: Secondary | ICD-10-CM | POA: Insufficient documentation

## 2020-02-17 DIAGNOSIS — Z5111 Encounter for antineoplastic chemotherapy: Secondary | ICD-10-CM | POA: Insufficient documentation

## 2020-02-17 DIAGNOSIS — L309 Dermatitis, unspecified: Secondary | ICD-10-CM | POA: Diagnosis not present

## 2020-02-17 DIAGNOSIS — Z8673 Personal history of transient ischemic attack (TIA), and cerebral infarction without residual deficits: Secondary | ICD-10-CM | POA: Diagnosis not present

## 2020-02-17 DIAGNOSIS — E039 Hypothyroidism, unspecified: Secondary | ICD-10-CM | POA: Insufficient documentation

## 2020-02-17 DIAGNOSIS — Z7984 Long term (current) use of oral hypoglycemic drugs: Secondary | ICD-10-CM | POA: Insufficient documentation

## 2020-02-17 DIAGNOSIS — Z79899 Other long term (current) drug therapy: Secondary | ICD-10-CM | POA: Insufficient documentation

## 2020-02-17 DIAGNOSIS — Z7901 Long term (current) use of anticoagulants: Secondary | ICD-10-CM | POA: Diagnosis not present

## 2020-02-17 DIAGNOSIS — M1990 Unspecified osteoarthritis, unspecified site: Secondary | ICD-10-CM | POA: Insufficient documentation

## 2020-02-17 DIAGNOSIS — K219 Gastro-esophageal reflux disease without esophagitis: Secondary | ICD-10-CM | POA: Insufficient documentation

## 2020-02-17 DIAGNOSIS — D46Z Other myelodysplastic syndromes: Secondary | ICD-10-CM

## 2020-02-17 DIAGNOSIS — E785 Hyperlipidemia, unspecified: Secondary | ICD-10-CM | POA: Insufficient documentation

## 2020-02-17 DIAGNOSIS — Z7982 Long term (current) use of aspirin: Secondary | ICD-10-CM | POA: Diagnosis not present

## 2020-02-17 MED ORDER — AZACITIDINE CHEMO SQ INJECTION
75.0000 mg/m2 | Freq: Once | INTRAMUSCULAR | Status: AC
  Administered 2020-02-17: 152.5 mg via SUBCUTANEOUS
  Filled 2020-02-17: qty 6.1

## 2020-02-17 MED ORDER — ONDANSETRON HCL 8 MG PO TABS: 8.0000 mg | ORAL_TABLET | Freq: Once | ORAL | Status: DC

## 2020-02-17 NOTE — Telephone Encounter (Signed)
   [name] DOB: 1940/06/28 MRN: [id number]   RIDER WAIVER AND RELEASE OF LIABILITY  For purposes of improving physical access to our facilities, [facility] is pleased to partner with third parties to provide [facility] patients or other authorized individuals the option of convenient, on-demand ground transportation services (the [employer]") through use of the technology service that enables users to request on-demand ground transportation from independent third-party providers.  By opting to use and accept these [employer], I, the undersigned, hereby agree on behalf of myself, and on behalf of any minor child using the [employer] for whom I am the parent or legal guardian, as follows:  1. Government social research officer provided to me are provided by independent third-party transportation providers who are not [employer] or employees and who are unaffiliated with [employer]. 2. [name] is neither a transportation carrier nor a common or public carrier. 3. [name] has no control over the quality or safety of the transportation that occurs as a result of the [employer]. 4. [name] cannot guarantee that any third-party transportation provider will complete any arranged transportation service. 5. [facility] makes no representation, warranty, or guarantee regarding the reliability, timeliness, quality, safety, suitability, or availability of any of the Transport Services or that they will be error free. 6. I fully understand that traveling by vehicle involves risks and dangers of serious bodily injury, including permanent disability, paralysis, and death. I agree, on behalf of myself and on behalf of any minor child using the Transport Services for whom I am the parent or legal guardian, that the entire risk arising out of my use of the [employer] remains solely with me, to the maximum extent permitted under applicable law. 7. [name] are provided "as is" and "as available." [name] disclaims all representations and warranties, express, implied or statutory, not expressly set out in these terms, including the implied warranties of merchantability and fitness for a particular purpose. 8. I hereby waive and release [name], its agents, employees, officers, directors, representatives, insurers, attorneys, assigns, successors, subsidiaries, and affiliates from any and all past, present, or future claims, demands, liabilities, actions, causes of action, or suits of any kind directly or indirectly arising from acceptance and use of the [employer]. 9. I further waive and release [name] and its affiliates from all present and future liability and responsibility for any injury or death to persons or damages to property caused by or related to the use of the [employer]. 10. I have read this Waiver and Release of Liability, and I understand the terms used in it and their legal significance. This Waiver is freely and voluntarily given with the understanding that my right (as well as the right of any minor child for whom I am the parent or legal guardian using the [employer]) to legal recourse against [name] in connection with the [employer] is knowingly surrendered in return for use of these services.   I attest that I read the consent document to [name], gave [name] the opportunity to ask questions and answered the questions asked (if any). I affirm that [name] then provided consent for she's participation in this program.     [name]

## 2020-02-17 NOTE — Patient Instructions (Signed)
[facility] - [place] Discharge Instructions for Patients Receiving Chemotherapy  Today you received the following chemotherapy agents Azacitadine  To help prevent nausea and vomiting after your treatment, we encourage you to take your nausea medication as directed.   If you develop nausea and vomiting that is not controlled by your nausea medication, call the clinic.   BELOW ARE SYMPTOMS THAT SHOULD BE REPORTED IMMEDIATELY:  *FEVER GREATER THAN 100.5 F  *CHILLS WITH OR WITHOUT FEVER  NAUSEA AND VOMITING THAT IS NOT CONTROLLED WITH YOUR NAUSEA MEDICATION  *UNUSUAL SHORTNESS OF BREATH  *UNUSUAL BRUISING OR BLEEDING  TENDERNESS IN MOUTH AND THROAT WITH OR WITHOUT PRESENCE OF ULCERS  *URINARY PROBLEMS  *BOWEL PROBLEMS  UNUSUAL RASH Items with * indicate a potential emergency and should be followed up as soon as possible.  Feel free to call the clinic should you have any questions or concerns at The clinic phone number is [phone].  Please show the CHEMO ALERT CARD at check-in to the Emergency Department and triage nurse.   

## 2020-02-17 NOTE — Progress Notes (Signed)
1215: PT STABLE AT TIME OF DISCHARGE  

## 2020-02-18 ENCOUNTER — Inpatient Hospital Stay: Payer: Medicare HMO

## 2020-02-18 ENCOUNTER — Other Ambulatory Visit: Payer: Self-pay

## 2020-02-18 VITALS — BP 149/68 | HR 80 | Temp 98.6°F | Resp 20 | Ht 61.0 in | Wt 206.0 lb

## 2020-02-18 DIAGNOSIS — E785 Hyperlipidemia, unspecified: Secondary | ICD-10-CM | POA: Diagnosis not present

## 2020-02-18 DIAGNOSIS — E039 Hypothyroidism, unspecified: Secondary | ICD-10-CM | POA: Diagnosis not present

## 2020-02-18 DIAGNOSIS — D46Z Other myelodysplastic syndromes: Secondary | ICD-10-CM

## 2020-02-18 DIAGNOSIS — Z7984 Long term (current) use of oral hypoglycemic drugs: Secondary | ICD-10-CM | POA: Diagnosis not present

## 2020-02-18 DIAGNOSIS — Z7982 Long term (current) use of aspirin: Secondary | ICD-10-CM | POA: Diagnosis not present

## 2020-02-18 DIAGNOSIS — L89153 Pressure ulcer of sacral region, stage 3: Secondary | ICD-10-CM | POA: Diagnosis not present

## 2020-02-18 DIAGNOSIS — M1990 Unspecified osteoarthritis, unspecified site: Secondary | ICD-10-CM | POA: Diagnosis not present

## 2020-02-18 DIAGNOSIS — I10 Essential (primary) hypertension: Secondary | ICD-10-CM | POA: Diagnosis not present

## 2020-02-18 DIAGNOSIS — M6281 Muscle weakness (generalized): Secondary | ICD-10-CM | POA: Diagnosis not present

## 2020-02-18 DIAGNOSIS — Z5111 Encounter for antineoplastic chemotherapy: Secondary | ICD-10-CM | POA: Diagnosis not present

## 2020-02-18 DIAGNOSIS — I110 Hypertensive heart disease with heart failure: Secondary | ICD-10-CM | POA: Diagnosis not present

## 2020-02-18 DIAGNOSIS — K219 Gastro-esophageal reflux disease without esophagitis: Secondary | ICD-10-CM | POA: Diagnosis not present

## 2020-02-18 DIAGNOSIS — D4621 Refractory anemia with excess of blasts 1: Secondary | ICD-10-CM | POA: Diagnosis not present

## 2020-02-18 DIAGNOSIS — I872 Venous insufficiency (chronic) (peripheral): Secondary | ICD-10-CM | POA: Diagnosis not present

## 2020-02-18 DIAGNOSIS — E1151 Type 2 diabetes mellitus with diabetic peripheral angiopathy without gangrene: Secondary | ICD-10-CM | POA: Diagnosis not present

## 2020-02-18 DIAGNOSIS — I509 Heart failure, unspecified: Secondary | ICD-10-CM | POA: Diagnosis not present

## 2020-02-18 DIAGNOSIS — I4891 Unspecified atrial fibrillation: Secondary | ICD-10-CM | POA: Diagnosis not present

## 2020-02-18 DIAGNOSIS — Z7901 Long term (current) use of anticoagulants: Secondary | ICD-10-CM | POA: Diagnosis not present

## 2020-02-18 MED ORDER — ONDANSETRON HCL 8 MG PO TABS: 8.0000 mg | ORAL_TABLET | Freq: Once | ORAL | Status: DC

## 2020-02-18 MED ORDER — AZACITIDINE CHEMO SQ INJECTION
75.0000 mg/m2 | Freq: Once | INTRAMUSCULAR | Status: AC
  Administered 2020-02-18: 152.5 mg via SUBCUTANEOUS
  Filled 2020-02-18: qty 6.1

## 2020-02-18 NOTE — Patient Instructions (Signed)
[facility] - [place] Discharge Instructions for Patients Receiving Chemotherapy  Today you received the following chemotherapy agents Azacitadine  To help prevent nausea and vomiting after your treatment, we encourage you to take your nausea medication as directed.   If you develop nausea and vomiting that is not controlled by your nausea medication, call the clinic.   BELOW ARE SYMPTOMS THAT SHOULD BE REPORTED IMMEDIATELY:  *FEVER GREATER THAN 100.5 F  *CHILLS WITH OR WITHOUT FEVER  NAUSEA AND VOMITING THAT IS NOT CONTROLLED WITH YOUR NAUSEA MEDICATION  *UNUSUAL SHORTNESS OF BREATH  *UNUSUAL BRUISING OR BLEEDING  TENDERNESS IN MOUTH AND THROAT WITH OR WITHOUT PRESENCE OF ULCERS  *URINARY PROBLEMS  *BOWEL PROBLEMS  UNUSUAL RASH Items with * indicate a potential emergency and should be followed up as soon as possible.  Feel free to call the clinic should you have any questions or concerns at The clinic phone number is [phone].  Please show the CHEMO ALERT CARD at check-in to the Emergency Department and triage nurse.   

## 2020-02-18 NOTE — Progress Notes (Signed)
1145:PT STABLE AT TIME OF DISCHARGE  

## 2020-02-19 ENCOUNTER — Inpatient Hospital Stay: Payer: Medicare HMO

## 2020-02-19 VITALS — BP 137/56 | HR 84 | Temp 99.0°F | Resp 20 | Ht 61.0 in | Wt 208.0 lb

## 2020-02-19 DIAGNOSIS — I10 Essential (primary) hypertension: Secondary | ICD-10-CM | POA: Diagnosis not present

## 2020-02-19 DIAGNOSIS — M1990 Unspecified osteoarthritis, unspecified site: Secondary | ICD-10-CM | POA: Diagnosis not present

## 2020-02-19 DIAGNOSIS — I110 Hypertensive heart disease with heart failure: Secondary | ICD-10-CM | POA: Diagnosis not present

## 2020-02-19 DIAGNOSIS — M6281 Muscle weakness (generalized): Secondary | ICD-10-CM | POA: Diagnosis not present

## 2020-02-19 DIAGNOSIS — Z7984 Long term (current) use of oral hypoglycemic drugs: Secondary | ICD-10-CM | POA: Diagnosis not present

## 2020-02-19 DIAGNOSIS — Z5111 Encounter for antineoplastic chemotherapy: Secondary | ICD-10-CM | POA: Diagnosis not present

## 2020-02-19 DIAGNOSIS — L89153 Pressure ulcer of sacral region, stage 3: Secondary | ICD-10-CM | POA: Diagnosis not present

## 2020-02-19 DIAGNOSIS — I509 Heart failure, unspecified: Secondary | ICD-10-CM | POA: Diagnosis not present

## 2020-02-19 DIAGNOSIS — E039 Hypothyroidism, unspecified: Secondary | ICD-10-CM | POA: Diagnosis not present

## 2020-02-19 DIAGNOSIS — Z7982 Long term (current) use of aspirin: Secondary | ICD-10-CM | POA: Diagnosis not present

## 2020-02-19 DIAGNOSIS — D46Z Other myelodysplastic syndromes: Secondary | ICD-10-CM

## 2020-02-19 DIAGNOSIS — I4891 Unspecified atrial fibrillation: Secondary | ICD-10-CM | POA: Diagnosis not present

## 2020-02-19 DIAGNOSIS — E785 Hyperlipidemia, unspecified: Secondary | ICD-10-CM | POA: Diagnosis not present

## 2020-02-19 DIAGNOSIS — I872 Venous insufficiency (chronic) (peripheral): Secondary | ICD-10-CM | POA: Diagnosis not present

## 2020-02-19 DIAGNOSIS — Z7901 Long term (current) use of anticoagulants: Secondary | ICD-10-CM | POA: Diagnosis not present

## 2020-02-19 DIAGNOSIS — D4621 Refractory anemia with excess of blasts 1: Secondary | ICD-10-CM | POA: Diagnosis not present

## 2020-02-19 DIAGNOSIS — E1151 Type 2 diabetes mellitus with diabetic peripheral angiopathy without gangrene: Secondary | ICD-10-CM | POA: Diagnosis not present

## 2020-02-19 DIAGNOSIS — K219 Gastro-esophageal reflux disease without esophagitis: Secondary | ICD-10-CM | POA: Diagnosis not present

## 2020-02-19 MED ORDER — AZACITIDINE CHEMO SQ INJECTION
75.0000 mg/m2 | Freq: Once | INTRAMUSCULAR | Status: AC
  Administered 2020-02-19: 152.5 mg via SUBCUTANEOUS
  Filled 2020-02-19: qty 6.1

## 2020-02-19 MED ORDER — ONDANSETRON HCL 8 MG PO TABS: 8.0000 mg | ORAL_TABLET | Freq: Once | ORAL | Status: DC

## 2020-02-19 NOTE — Patient Instructions (Signed)
[facility] Discharge Instructions for Patients Receiving Chemotherapy  Today you received the following chemotherapy agents Azacitidine  To help prevent nausea and vomiting after your treatment, we encourage you to take your nausea medication.   If you develop nausea and vomiting that is not controlled by your nausea medication, call the clinic.   BELOW ARE SYMPTOMS THAT SHOULD BE REPORTED IMMEDIATELY:  *FEVER GREATER THAN 100.5 F  *CHILLS WITH OR WITHOUT FEVER  NAUSEA AND VOMITING THAT IS NOT CONTROLLED WITH YOUR NAUSEA MEDICATION  *UNUSUAL SHORTNESS OF BREATH  *UNUSUAL BRUISING OR BLEEDING  TENDERNESS IN MOUTH AND THROAT WITH OR WITHOUT PRESENCE OF ULCERS  *URINARY PROBLEMS  *BOWEL PROBLEMS  UNUSUAL RASH Items with * indicate a potential emergency and should be followed up as soon as possible.  Feel free to call the clinic should you have any questions or concerns at The clinic phone number is [phone].  Please show the [place] at check-in to the Emergency Department and triage nurse.  Azacitidine suspension for injection (subcutaneous use) What is this medicine? AZACITIDINE (ay [place]) is a chemotherapy drug. This medicine reduces the growth of cancer cells and can suppress the immune system. It is used for treating myelodysplastic syndrome or some types of leukemia. This medicine may be used for other purposes; ask your health care provider or pharmacist if you have questions. COMMON BRAND NAME(S): Vidaza What should I tell my health care provider before I take this medicine? They need to know if you have any of these conditions:  kidney disease  liver disease  liver tumors  an unusual or allergic reaction to azacitidine, mannitol, other medicines, foods, dyes, or preservatives  pregnant or trying to get pregnant  breast-feeding How should I use this medicine? This medicine is for injection under the skin. It  is administered in a hospital or clinic by a specially trained health care professional. Talk to your pediatrician regarding the use of this medicine in children. While this drug may be prescribed for selected conditions, precautions do apply. Overdosage: If you think you have taken too much of this medicine contact a poison control center or emergency room at once. NOTE: This medicine is only for you. Do not share this medicine with others. What if I miss a dose? It is important not to miss your dose. Call your doctor or health care professional if you are unable to keep an appointment. What may interact with this medicine? Interactions have not been studied. Give your health care provider a list of all the medicines, herbs, non-prescription drugs, or dietary supplements you use. Also tell them if you smoke, drink alcohol, or use illegal drugs. Some items may interact with your medicine. This list may not describe all possible interactions. Give your health care provider a list of all the medicines, herbs, non-prescription drugs, or dietary supplements you use. Also tell them if you smoke, drink alcohol, or use illegal drugs. Some items may interact with your medicine. What should I watch for while using this medicine? Visit your doctor for checks on your progress. This drug may make you feel generally unwell. This is not uncommon, as chemotherapy can affect healthy cells as well as cancer cells. Report any side effects. Continue your course of treatment even though you feel ill unless your doctor tells you to stop. In some cases, you may be given additional medicines to help with side effects. Follow all directions for their use. Call  your doctor or health care professional for advice if you get a fever, chills or sore throat, or other symptoms of a cold or flu. Do not treat yourself. This drug decreases your body's ability to fight infections. Try to avoid being around people who are sick. This  medicine may increase your risk to bruise or bleed. Call your doctor or health care professional if you notice any unusual bleeding. You may need blood work done while you are taking this medicine. Do not become pregnant while taking this medicine and for 6 months after the last dose. Women should inform their doctor if they wish to become pregnant or think they might be pregnant. Men should not father a child while taking this medicine and for 3 months after the last dose. There is a potential for serious side effects to an unborn child. Talk to your health care professional or pharmacist for more information. Do not breast-feed an infant while taking this medicine and for 1 week after the last dose. This medicine may interfere with the ability to have a child. Talk with your doctor or health care professional if you are concerned about your fertility. What side effects may I notice from receiving this medicine? Side effects that you should report to your doctor or health care professional as soon as possible:  allergic reactions like skin rash, itching or hives, swelling of the face, lips, or tongue  low blood counts - this medicine may decrease the number of white blood cells, red blood cells and platelets. You may be at increased risk for infections and bleeding.  signs of infection - fever or chills, cough, sore throat, pain passing urine  signs of decreased platelets or bleeding - bruising, pinpoint red spots on the skin, black, tarry stools, blood in the urine  signs of decreased red blood cells - unusually weak or tired, fainting spells, lightheadedness  signs and symptoms of kidney injury like trouble passing urine or change in the amount of urine  signs and symptoms of liver injury like dark yellow or brown urine; general ill feeling or flu-like symptoms; light-colored stools; loss of appetite; nausea; right upper belly pain; unusually weak or tired; yellowing of the eyes or skin Side  effects that usually do not require medical attention (report to your doctor or health care professional if they continue or are bothersome):  constipation  diarrhea  nausea, vomiting  pain or redness at the injection site  unusually weak or tired This list may not describe all possible side effects. Call your doctor for medical advice about side effects. You may report side effects to FDA at 1-800-FDA-1088. Where should I keep my medicine? This drug is given in a hospital or clinic and will not be stored at home. NOTE: This sheet is a summary. It may not cover all possible information. If you have questions about this medicine, talk to your doctor, pharmacist, or health care provider.  2021 Elsevier/Gold Standard (2016-02-01 14:37:51)

## 2020-02-20 ENCOUNTER — Other Ambulatory Visit: Payer: Self-pay

## 2020-02-20 ENCOUNTER — Inpatient Hospital Stay: Payer: Medicare HMO

## 2020-02-20 VITALS — BP 149/63 | HR 83 | Temp 99.0°F | Resp 20 | Ht 61.0 in | Wt 209.8 lb

## 2020-02-20 DIAGNOSIS — E785 Hyperlipidemia, unspecified: Secondary | ICD-10-CM | POA: Diagnosis not present

## 2020-02-20 DIAGNOSIS — K219 Gastro-esophageal reflux disease without esophagitis: Secondary | ICD-10-CM | POA: Diagnosis not present

## 2020-02-20 DIAGNOSIS — M1990 Unspecified osteoarthritis, unspecified site: Secondary | ICD-10-CM | POA: Diagnosis not present

## 2020-02-20 DIAGNOSIS — Z7982 Long term (current) use of aspirin: Secondary | ICD-10-CM | POA: Diagnosis not present

## 2020-02-20 DIAGNOSIS — D46Z Other myelodysplastic syndromes: Secondary | ICD-10-CM

## 2020-02-20 DIAGNOSIS — I4891 Unspecified atrial fibrillation: Secondary | ICD-10-CM | POA: Diagnosis not present

## 2020-02-20 DIAGNOSIS — Z5111 Encounter for antineoplastic chemotherapy: Secondary | ICD-10-CM | POA: Diagnosis not present

## 2020-02-20 DIAGNOSIS — E1151 Type 2 diabetes mellitus with diabetic peripheral angiopathy without gangrene: Secondary | ICD-10-CM | POA: Diagnosis not present

## 2020-02-20 DIAGNOSIS — I110 Hypertensive heart disease with heart failure: Secondary | ICD-10-CM | POA: Diagnosis not present

## 2020-02-20 DIAGNOSIS — D4621 Refractory anemia with excess of blasts 1: Secondary | ICD-10-CM | POA: Diagnosis not present

## 2020-02-20 DIAGNOSIS — E039 Hypothyroidism, unspecified: Secondary | ICD-10-CM | POA: Diagnosis not present

## 2020-02-20 DIAGNOSIS — I872 Venous insufficiency (chronic) (peripheral): Secondary | ICD-10-CM | POA: Diagnosis not present

## 2020-02-20 DIAGNOSIS — M6281 Muscle weakness (generalized): Secondary | ICD-10-CM | POA: Diagnosis not present

## 2020-02-20 DIAGNOSIS — Z7984 Long term (current) use of oral hypoglycemic drugs: Secondary | ICD-10-CM | POA: Diagnosis not present

## 2020-02-20 DIAGNOSIS — L89153 Pressure ulcer of sacral region, stage 3: Secondary | ICD-10-CM | POA: Diagnosis not present

## 2020-02-20 DIAGNOSIS — I509 Heart failure, unspecified: Secondary | ICD-10-CM | POA: Diagnosis not present

## 2020-02-20 DIAGNOSIS — Z7901 Long term (current) use of anticoagulants: Secondary | ICD-10-CM | POA: Diagnosis not present

## 2020-02-20 DIAGNOSIS — I10 Essential (primary) hypertension: Secondary | ICD-10-CM | POA: Diagnosis not present

## 2020-02-20 MED ORDER — ONDANSETRON HCL 8 MG PO TABS: 8.0000 mg | ORAL_TABLET | Freq: Once | ORAL | Status: DC

## 2020-02-20 MED ORDER — AZACITIDINE CHEMO SQ INJECTION
75.0000 mg/m2 | Freq: Once | INTRAMUSCULAR | Status: AC
  Administered 2020-02-20: 152.5 mg via SUBCUTANEOUS
  Filled 2020-02-20: qty 6.1

## 2020-02-20 NOTE — Patient Instructions (Signed)
[facility] - [place] Discharge Instructions for Patients Receiving Chemotherapy  Today you received the following chemotherapy agents Azacitadine  To help prevent nausea and vomiting after your treatment, we encourage you to take your nausea medication as directed.   If you develop nausea and vomiting that is not controlled by your nausea medication, call the clinic.   BELOW ARE SYMPTOMS THAT SHOULD BE REPORTED IMMEDIATELY:  *FEVER GREATER THAN 100.5 F  *CHILLS WITH OR WITHOUT FEVER  NAUSEA AND VOMITING THAT IS NOT CONTROLLED WITH YOUR NAUSEA MEDICATION  *UNUSUAL SHORTNESS OF BREATH  *UNUSUAL BRUISING OR BLEEDING  TENDERNESS IN MOUTH AND THROAT WITH OR WITHOUT PRESENCE OF ULCERS  *URINARY PROBLEMS  *BOWEL PROBLEMS  UNUSUAL RASH Items with * indicate a potential emergency and should be followed up as soon as possible.  Feel free to call the clinic should you have any questions or concerns at The clinic phone number is [phone].  Please show the CHEMO ALERT CARD at check-in to the Emergency Department and triage nurse.   

## 2020-02-20 NOTE — Progress Notes (Signed)
1135 PT STABLE AT TIME OF DISCHARGE. 

## 2020-02-21 DIAGNOSIS — M6281 Muscle weakness (generalized): Secondary | ICD-10-CM | POA: Diagnosis not present

## 2020-02-21 DIAGNOSIS — E1151 Type 2 diabetes mellitus with diabetic peripheral angiopathy without gangrene: Secondary | ICD-10-CM | POA: Diagnosis not present

## 2020-02-21 DIAGNOSIS — L89153 Pressure ulcer of sacral region, stage 3: Secondary | ICD-10-CM | POA: Diagnosis not present

## 2020-02-21 DIAGNOSIS — Z7901 Long term (current) use of anticoagulants: Secondary | ICD-10-CM | POA: Diagnosis not present

## 2020-02-21 DIAGNOSIS — Z7984 Long term (current) use of oral hypoglycemic drugs: Secondary | ICD-10-CM | POA: Diagnosis not present

## 2020-02-21 DIAGNOSIS — I872 Venous insufficiency (chronic) (peripheral): Secondary | ICD-10-CM | POA: Diagnosis not present

## 2020-02-21 DIAGNOSIS — I110 Hypertensive heart disease with heart failure: Secondary | ICD-10-CM | POA: Diagnosis not present

## 2020-02-21 DIAGNOSIS — I4891 Unspecified atrial fibrillation: Secondary | ICD-10-CM | POA: Diagnosis not present

## 2020-02-21 DIAGNOSIS — I509 Heart failure, unspecified: Secondary | ICD-10-CM | POA: Diagnosis not present

## 2020-02-23 ENCOUNTER — Other Ambulatory Visit: Payer: Self-pay

## 2020-02-23 ENCOUNTER — Inpatient Hospital Stay: Payer: Medicare HMO

## 2020-02-23 VITALS — BP 137/46 | HR 74 | Temp 97.8°F | Resp 20 | Ht 61.0 in | Wt 208.2 lb

## 2020-02-23 DIAGNOSIS — K219 Gastro-esophageal reflux disease without esophagitis: Secondary | ICD-10-CM | POA: Diagnosis not present

## 2020-02-23 DIAGNOSIS — Z5111 Encounter for antineoplastic chemotherapy: Secondary | ICD-10-CM | POA: Diagnosis not present

## 2020-02-23 DIAGNOSIS — L89153 Pressure ulcer of sacral region, stage 3: Secondary | ICD-10-CM | POA: Diagnosis not present

## 2020-02-23 DIAGNOSIS — M6281 Muscle weakness (generalized): Secondary | ICD-10-CM | POA: Diagnosis not present

## 2020-02-23 DIAGNOSIS — D4621 Refractory anemia with excess of blasts 1: Secondary | ICD-10-CM | POA: Diagnosis not present

## 2020-02-23 DIAGNOSIS — I872 Venous insufficiency (chronic) (peripheral): Secondary | ICD-10-CM | POA: Diagnosis not present

## 2020-02-23 DIAGNOSIS — Z7984 Long term (current) use of oral hypoglycemic drugs: Secondary | ICD-10-CM | POA: Diagnosis not present

## 2020-02-23 DIAGNOSIS — E1151 Type 2 diabetes mellitus with diabetic peripheral angiopathy without gangrene: Secondary | ICD-10-CM | POA: Diagnosis not present

## 2020-02-23 DIAGNOSIS — E785 Hyperlipidemia, unspecified: Secondary | ICD-10-CM | POA: Diagnosis not present

## 2020-02-23 DIAGNOSIS — I10 Essential (primary) hypertension: Secondary | ICD-10-CM | POA: Diagnosis not present

## 2020-02-23 DIAGNOSIS — Z7982 Long term (current) use of aspirin: Secondary | ICD-10-CM | POA: Diagnosis not present

## 2020-02-23 DIAGNOSIS — D46Z Other myelodysplastic syndromes: Secondary | ICD-10-CM

## 2020-02-23 DIAGNOSIS — M1990 Unspecified osteoarthritis, unspecified site: Secondary | ICD-10-CM | POA: Diagnosis not present

## 2020-02-23 DIAGNOSIS — Z7901 Long term (current) use of anticoagulants: Secondary | ICD-10-CM | POA: Diagnosis not present

## 2020-02-23 DIAGNOSIS — I4891 Unspecified atrial fibrillation: Secondary | ICD-10-CM | POA: Diagnosis not present

## 2020-02-23 DIAGNOSIS — I110 Hypertensive heart disease with heart failure: Secondary | ICD-10-CM | POA: Diagnosis not present

## 2020-02-23 DIAGNOSIS — E039 Hypothyroidism, unspecified: Secondary | ICD-10-CM | POA: Diagnosis not present

## 2020-02-23 DIAGNOSIS — I509 Heart failure, unspecified: Secondary | ICD-10-CM | POA: Diagnosis not present

## 2020-02-23 MED ORDER — ONDANSETRON HCL 8 MG PO TABS: 8.0000 mg | ORAL_TABLET | Freq: Once | ORAL | Status: DC

## 2020-02-23 MED ORDER — AZACITIDINE CHEMO SQ INJECTION
75.0000 mg/m2 | Freq: Once | INTRAMUSCULAR | Status: AC
  Administered 2020-02-23: 152.5 mg via SUBCUTANEOUS
  Filled 2020-02-23: qty 6.1

## 2020-02-23 NOTE — Progress Notes (Signed)
1154: PT STABLE AT TIME OF DISCHARGE

## 2020-02-24 ENCOUNTER — Inpatient Hospital Stay: Payer: Medicare HMO

## 2020-02-24 VITALS — BP 132/60 | HR 82 | Temp 98.5°F | Resp 18 | Ht 61.0 in | Wt 209.8 lb

## 2020-02-24 DIAGNOSIS — K219 Gastro-esophageal reflux disease without esophagitis: Secondary | ICD-10-CM | POA: Diagnosis not present

## 2020-02-24 DIAGNOSIS — D4621 Refractory anemia with excess of blasts 1: Secondary | ICD-10-CM | POA: Diagnosis not present

## 2020-02-24 DIAGNOSIS — D46Z Other myelodysplastic syndromes: Secondary | ICD-10-CM

## 2020-02-24 DIAGNOSIS — E039 Hypothyroidism, unspecified: Secondary | ICD-10-CM | POA: Diagnosis not present

## 2020-02-24 DIAGNOSIS — M1990 Unspecified osteoarthritis, unspecified site: Secondary | ICD-10-CM | POA: Diagnosis not present

## 2020-02-24 DIAGNOSIS — E785 Hyperlipidemia, unspecified: Secondary | ICD-10-CM | POA: Diagnosis not present

## 2020-02-24 DIAGNOSIS — Z7984 Long term (current) use of oral hypoglycemic drugs: Secondary | ICD-10-CM | POA: Diagnosis not present

## 2020-02-24 DIAGNOSIS — Z7982 Long term (current) use of aspirin: Secondary | ICD-10-CM | POA: Diagnosis not present

## 2020-02-24 DIAGNOSIS — Z5111 Encounter for antineoplastic chemotherapy: Secondary | ICD-10-CM | POA: Diagnosis not present

## 2020-02-24 DIAGNOSIS — I10 Essential (primary) hypertension: Secondary | ICD-10-CM | POA: Diagnosis not present

## 2020-02-24 MED ORDER — AZACITIDINE CHEMO SQ INJECTION
75.0000 mg/m2 | Freq: Once | INTRAMUSCULAR | Status: AC
  Administered 2020-02-24: 152.5 mg via SUBCUTANEOUS
  Filled 2020-02-24: qty 6.1

## 2020-02-24 MED ORDER — ONDANSETRON HCL 8 MG PO TABS: 8.0000 mg | ORAL_TABLET | Freq: Once | ORAL | Status: DC

## 2020-02-24 NOTE — Patient Instructions (Signed)
[facility] - [place] Discharge Instructions for Patients Receiving Chemotherapy  Today you received the following chemotherapy agents Azacitadine  To help prevent nausea and vomiting after your treatment, we encourage you to take your nausea medication as directed.   If you develop nausea and vomiting that is not controlled by your nausea medication, call the clinic.   BELOW ARE SYMPTOMS THAT SHOULD BE REPORTED IMMEDIATELY:  *FEVER GREATER THAN 100.5 F  *CHILLS WITH OR WITHOUT FEVER  NAUSEA AND VOMITING THAT IS NOT CONTROLLED WITH YOUR NAUSEA MEDICATION  *UNUSUAL SHORTNESS OF BREATH  *UNUSUAL BRUISING OR BLEEDING  TENDERNESS IN MOUTH AND THROAT WITH OR WITHOUT PRESENCE OF ULCERS  *URINARY PROBLEMS  *BOWEL PROBLEMS  UNUSUAL RASH Items with * indicate a potential emergency and should be followed up as soon as possible.  Feel free to call the clinic should you have any questions or concerns at The clinic phone number is [phone].  Please show the CHEMO ALERT CARD at check-in to the Emergency Department and triage nurse.   

## 2020-02-24 NOTE — Progress Notes (Signed)
1111: PT STABLE AT TIME OF DISCHARGE  

## 2020-02-25 ENCOUNTER — Inpatient Hospital Stay: Payer: Medicare HMO

## 2020-02-25 ENCOUNTER — Other Ambulatory Visit: Payer: Self-pay

## 2020-02-25 VITALS — BP 164/70 | HR 64 | Temp 98.4°F | Resp 20 | Ht 61.0 in | Wt 209.8 lb

## 2020-02-25 DIAGNOSIS — I10 Essential (primary) hypertension: Secondary | ICD-10-CM | POA: Diagnosis not present

## 2020-02-25 DIAGNOSIS — Z5111 Encounter for antineoplastic chemotherapy: Secondary | ICD-10-CM | POA: Diagnosis not present

## 2020-02-25 DIAGNOSIS — D46Z Other myelodysplastic syndromes: Secondary | ICD-10-CM

## 2020-02-25 DIAGNOSIS — I509 Heart failure, unspecified: Secondary | ICD-10-CM | POA: Diagnosis not present

## 2020-02-25 DIAGNOSIS — M1990 Unspecified osteoarthritis, unspecified site: Secondary | ICD-10-CM | POA: Diagnosis not present

## 2020-02-25 DIAGNOSIS — Z7984 Long term (current) use of oral hypoglycemic drugs: Secondary | ICD-10-CM | POA: Diagnosis not present

## 2020-02-25 DIAGNOSIS — I872 Venous insufficiency (chronic) (peripheral): Secondary | ICD-10-CM | POA: Diagnosis not present

## 2020-02-25 DIAGNOSIS — I4891 Unspecified atrial fibrillation: Secondary | ICD-10-CM | POA: Diagnosis not present

## 2020-02-25 DIAGNOSIS — E785 Hyperlipidemia, unspecified: Secondary | ICD-10-CM | POA: Diagnosis not present

## 2020-02-25 DIAGNOSIS — M6281 Muscle weakness (generalized): Secondary | ICD-10-CM | POA: Diagnosis not present

## 2020-02-25 DIAGNOSIS — E1151 Type 2 diabetes mellitus with diabetic peripheral angiopathy without gangrene: Secondary | ICD-10-CM | POA: Diagnosis not present

## 2020-02-25 DIAGNOSIS — D4621 Refractory anemia with excess of blasts 1: Secondary | ICD-10-CM | POA: Diagnosis not present

## 2020-02-25 DIAGNOSIS — I110 Hypertensive heart disease with heart failure: Secondary | ICD-10-CM | POA: Diagnosis not present

## 2020-02-25 DIAGNOSIS — K219 Gastro-esophageal reflux disease without esophagitis: Secondary | ICD-10-CM | POA: Diagnosis not present

## 2020-02-25 DIAGNOSIS — L89153 Pressure ulcer of sacral region, stage 3: Secondary | ICD-10-CM | POA: Diagnosis not present

## 2020-02-25 DIAGNOSIS — Z7982 Long term (current) use of aspirin: Secondary | ICD-10-CM | POA: Diagnosis not present

## 2020-02-25 DIAGNOSIS — E039 Hypothyroidism, unspecified: Secondary | ICD-10-CM | POA: Diagnosis not present

## 2020-02-25 DIAGNOSIS — Z7901 Long term (current) use of anticoagulants: Secondary | ICD-10-CM | POA: Diagnosis not present

## 2020-02-25 MED ORDER — AZACITIDINE CHEMO SQ INJECTION
75.0000 mg/m2 | Freq: Once | INTRAMUSCULAR | Status: AC
  Administered 2020-02-25: 152.5 mg via SUBCUTANEOUS
  Filled 2020-02-25: qty 6.1

## 2020-02-25 MED ORDER — ONDANSETRON HCL 8 MG PO TABS: 8.0000 mg | ORAL_TABLET | Freq: Once | ORAL | Status: DC

## 2020-02-25 NOTE — Patient Instructions (Signed)
[facility] Discharge Instructions for Patients Receiving Chemotherapy  Today you received the following chemotherapy agents Azacitidine. To help prevent nausea and vomiting after your treatment, we encourage you to take your nausea medication. Azacitidine suspension for injection (subcutaneous use) What is this medicine? AZACITIDINE (ay [place]) is a chemotherapy drug. This medicine reduces the growth of cancer cells and can suppress the immune system. It is used for treating myelodysplastic syndrome or some types of leukemia. This medicine may be used for other purposes; ask your health care provider or pharmacist if you have questions. COMMON BRAND NAME(S): Vidaza What should I tell my health care provider before I take this medicine? They need to know if you have any of these conditions:  kidney disease  liver disease  liver tumors  an unusual or allergic reaction to azacitidine, mannitol, other medicines, foods, dyes, or preservatives  pregnant or trying to get pregnant  breast-feeding How should I use this medicine? This medicine is for injection under the skin. It is administered in a hospital or clinic by a specially trained health care professional. Talk to your pediatrician regarding the use of this medicine in children. While this drug may be prescribed for selected conditions, precautions do apply. Overdosage: If you think you have taken too much of this medicine contact a poison control center or emergency room at once. NOTE: This medicine is only for you. Do not share this medicine with others. What if I miss a dose? It is important not to miss your dose. Call your doctor or health care professional if you are unable to keep an appointment. What may interact with this medicine? Interactions have not been studied. Give your health care provider a list of all the medicines, herbs, non-prescription drugs, or dietary supplements you use. Also  tell them if you smoke, drink alcohol, or use illegal drugs. Some items may interact with your medicine. This list may not describe all possible interactions. Give your health care provider a list of all the medicines, herbs, non-prescription drugs, or dietary supplements you use. Also tell them if you smoke, drink alcohol, or use illegal drugs. Some items may interact with your medicine. What should I watch for while using this medicine? Visit your doctor for checks on your progress. This drug may make you feel generally unwell. This is not uncommon, as chemotherapy can affect healthy cells as well as cancer cells. Report any side effects. Continue your course of treatment even though you feel ill unless your doctor tells you to stop. In some cases, you may be given additional medicines to help with side effects. Follow all directions for their use. Call your doctor or health care professional for advice if you get a fever, chills or sore throat, or other symptoms of a cold or flu. Do not treat yourself. This drug decreases your body's ability to fight infections. Try to avoid being around people who are sick. This medicine may increase your risk to bruise or bleed. Call your doctor or health care professional if you notice any unusual bleeding. You may need blood work done while you are taking this medicine. Do not become pregnant while taking this medicine and for 6 months after the last dose. Women should inform their doctor if they wish to become pregnant or think they might be pregnant. Men should not father a child while taking this medicine and for 3 months after the last dose. There is a potential for serious side  effects to an unborn child. Talk to your health care professional or pharmacist for more information. Do not breast-feed an infant while taking this medicine and for 1 week after the last dose. This medicine may interfere with the ability to have a child. Talk with your doctor or health  care professional if you are concerned about your fertility. What side effects may I notice from receiving this medicine? Side effects that you should report to your doctor or health care professional as soon as possible:  allergic reactions like skin rash, itching or hives, swelling of the face, lips, or tongue  low blood counts - this medicine may decrease the number of white blood cells, red blood cells and platelets. You may be at increased risk for infections and bleeding.  signs of infection - fever or chills, cough, sore throat, pain passing urine  signs of decreased platelets or bleeding - bruising, pinpoint red spots on the skin, black, tarry stools, blood in the urine  signs of decreased red blood cells - unusually weak or tired, fainting spells, lightheadedness  signs and symptoms of kidney injury like trouble passing urine or change in the amount of urine  signs and symptoms of liver injury like dark yellow or brown urine; general ill feeling or flu-like symptoms; light-colored stools; loss of appetite; nausea; right upper belly pain; unusually weak or tired; yellowing of the eyes or skin Side effects that usually do not require medical attention (report to your doctor or health care professional if they continue or are bothersome):  constipation  diarrhea  nausea, vomiting  pain or redness at the injection site  unusually weak or tired This list may not describe all possible side effects. Call your doctor for medical advice about side effects. You may report side effects to FDA at 1-800-FDA-1088. Where should I keep my medicine? This drug is given in a hospital or clinic and will not be stored at home. NOTE: This sheet is a summary. It may not cover all possible information. If you have questions about this medicine, talk to your doctor, pharmacist, or health care provider.  2021 Elsevier/Gold Standard (2016-02-01 14:37:51)    If you develop nausea and vomiting that  is not controlled by your nausea medication, call the clinic.   BELOW ARE SYMPTOMS THAT SHOULD BE REPORTED IMMEDIATELY:  *FEVER GREATER THAN 100.5 F  *CHILLS WITH OR WITHOUT FEVER  NAUSEA AND VOMITING THAT IS NOT CONTROLLED WITH YOUR NAUSEA MEDICATION  *UNUSUAL SHORTNESS OF BREATH  *UNUSUAL BRUISING OR BLEEDING  TENDERNESS IN MOUTH AND THROAT WITH OR WITHOUT PRESENCE OF ULCERS  *URINARY PROBLEMS  *BOWEL PROBLEMS  UNUSUAL RASH Items with * indicate a potential emergency and should be followed up as soon as possible.  Feel free to call the clinic should you have any questions or concerns at The clinic phone number is [phone].  Please show the [place] at check-in to the Emergency Department and triage nurse.

## 2020-02-26 DIAGNOSIS — I442 Atrioventricular block, complete: Secondary | ICD-10-CM | POA: Diagnosis not present

## 2020-02-26 DIAGNOSIS — I4891 Unspecified atrial fibrillation: Secondary | ICD-10-CM | POA: Diagnosis not present

## 2020-02-26 DIAGNOSIS — M6281 Muscle weakness (generalized): Secondary | ICD-10-CM | POA: Diagnosis not present

## 2020-02-26 DIAGNOSIS — L89153 Pressure ulcer of sacral region, stage 3: Secondary | ICD-10-CM | POA: Diagnosis not present

## 2020-02-26 DIAGNOSIS — E1151 Type 2 diabetes mellitus with diabetic peripheral angiopathy without gangrene: Secondary | ICD-10-CM | POA: Diagnosis not present

## 2020-02-26 DIAGNOSIS — I509 Heart failure, unspecified: Secondary | ICD-10-CM | POA: Diagnosis not present

## 2020-02-26 DIAGNOSIS — Z7901 Long term (current) use of anticoagulants: Secondary | ICD-10-CM | POA: Diagnosis not present

## 2020-02-26 DIAGNOSIS — I110 Hypertensive heart disease with heart failure: Secondary | ICD-10-CM | POA: Diagnosis not present

## 2020-02-26 DIAGNOSIS — I872 Venous insufficiency (chronic) (peripheral): Secondary | ICD-10-CM | POA: Diagnosis not present

## 2020-02-26 DIAGNOSIS — Z7984 Long term (current) use of oral hypoglycemic drugs: Secondary | ICD-10-CM | POA: Diagnosis not present

## 2020-02-26 DIAGNOSIS — Z45018 Encounter for adjustment and management of other part of cardiac pacemaker: Secondary | ICD-10-CM | POA: Diagnosis not present

## 2020-02-27 DIAGNOSIS — M6281 Muscle weakness (generalized): Secondary | ICD-10-CM | POA: Diagnosis not present

## 2020-02-27 DIAGNOSIS — I4891 Unspecified atrial fibrillation: Secondary | ICD-10-CM | POA: Diagnosis not present

## 2020-02-27 DIAGNOSIS — Z7984 Long term (current) use of oral hypoglycemic drugs: Secondary | ICD-10-CM | POA: Diagnosis not present

## 2020-02-27 DIAGNOSIS — E1151 Type 2 diabetes mellitus with diabetic peripheral angiopathy without gangrene: Secondary | ICD-10-CM | POA: Diagnosis not present

## 2020-02-27 DIAGNOSIS — I872 Venous insufficiency (chronic) (peripheral): Secondary | ICD-10-CM | POA: Diagnosis not present

## 2020-02-27 DIAGNOSIS — Z7901 Long term (current) use of anticoagulants: Secondary | ICD-10-CM | POA: Diagnosis not present

## 2020-02-27 DIAGNOSIS — I110 Hypertensive heart disease with heart failure: Secondary | ICD-10-CM | POA: Diagnosis not present

## 2020-02-27 DIAGNOSIS — L89153 Pressure ulcer of sacral region, stage 3: Secondary | ICD-10-CM | POA: Diagnosis not present

## 2020-02-27 DIAGNOSIS — I509 Heart failure, unspecified: Secondary | ICD-10-CM | POA: Diagnosis not present

## 2020-03-01 DIAGNOSIS — M6281 Muscle weakness (generalized): Secondary | ICD-10-CM | POA: Diagnosis not present

## 2020-03-01 DIAGNOSIS — I872 Venous insufficiency (chronic) (peripheral): Secondary | ICD-10-CM | POA: Diagnosis not present

## 2020-03-01 DIAGNOSIS — I4891 Unspecified atrial fibrillation: Secondary | ICD-10-CM | POA: Diagnosis not present

## 2020-03-01 DIAGNOSIS — Z7984 Long term (current) use of oral hypoglycemic drugs: Secondary | ICD-10-CM | POA: Diagnosis not present

## 2020-03-01 DIAGNOSIS — Z7901 Long term (current) use of anticoagulants: Secondary | ICD-10-CM | POA: Diagnosis not present

## 2020-03-01 DIAGNOSIS — I509 Heart failure, unspecified: Secondary | ICD-10-CM | POA: Diagnosis not present

## 2020-03-01 DIAGNOSIS — I110 Hypertensive heart disease with heart failure: Secondary | ICD-10-CM | POA: Diagnosis not present

## 2020-03-01 DIAGNOSIS — E1151 Type 2 diabetes mellitus with diabetic peripheral angiopathy without gangrene: Secondary | ICD-10-CM | POA: Diagnosis not present

## 2020-03-01 DIAGNOSIS — L89153 Pressure ulcer of sacral region, stage 3: Secondary | ICD-10-CM | POA: Diagnosis not present

## 2020-03-02 DIAGNOSIS — I509 Heart failure, unspecified: Secondary | ICD-10-CM | POA: Diagnosis not present

## 2020-03-02 DIAGNOSIS — I872 Venous insufficiency (chronic) (peripheral): Secondary | ICD-10-CM | POA: Diagnosis not present

## 2020-03-02 DIAGNOSIS — E1151 Type 2 diabetes mellitus with diabetic peripheral angiopathy without gangrene: Secondary | ICD-10-CM | POA: Diagnosis not present

## 2020-03-02 DIAGNOSIS — I110 Hypertensive heart disease with heart failure: Secondary | ICD-10-CM | POA: Diagnosis not present

## 2020-03-02 DIAGNOSIS — I4891 Unspecified atrial fibrillation: Secondary | ICD-10-CM | POA: Diagnosis not present

## 2020-03-02 DIAGNOSIS — L89153 Pressure ulcer of sacral region, stage 3: Secondary | ICD-10-CM | POA: Diagnosis not present

## 2020-03-03 DIAGNOSIS — I890 Lymphedema, not elsewhere classified: Secondary | ICD-10-CM | POA: Diagnosis not present

## 2020-03-03 DIAGNOSIS — L89154 Pressure ulcer of sacral region, stage 4: Secondary | ICD-10-CM | POA: Diagnosis not present

## 2020-03-03 DIAGNOSIS — I509 Heart failure, unspecified: Secondary | ICD-10-CM | POA: Diagnosis not present

## 2020-03-03 DIAGNOSIS — I872 Venous insufficiency (chronic) (peripheral): Secondary | ICD-10-CM | POA: Diagnosis not present

## 2020-03-03 DIAGNOSIS — L89223 Pressure ulcer of left hip, stage 3: Secondary | ICD-10-CM | POA: Diagnosis not present

## 2020-03-03 DIAGNOSIS — I4891 Unspecified atrial fibrillation: Secondary | ICD-10-CM | POA: Diagnosis not present

## 2020-03-03 DIAGNOSIS — E119 Type 2 diabetes mellitus without complications: Secondary | ICD-10-CM | POA: Diagnosis not present

## 2020-03-03 DIAGNOSIS — T8131XA Disruption of external operation (surgical) wound, not elsewhere classified, initial encounter: Secondary | ICD-10-CM | POA: Diagnosis not present

## 2020-03-04 DIAGNOSIS — I509 Heart failure, unspecified: Secondary | ICD-10-CM | POA: Diagnosis not present

## 2020-03-04 DIAGNOSIS — E1151 Type 2 diabetes mellitus with diabetic peripheral angiopathy without gangrene: Secondary | ICD-10-CM | POA: Diagnosis not present

## 2020-03-04 DIAGNOSIS — L89153 Pressure ulcer of sacral region, stage 3: Secondary | ICD-10-CM | POA: Diagnosis not present

## 2020-03-04 DIAGNOSIS — I872 Venous insufficiency (chronic) (peripheral): Secondary | ICD-10-CM | POA: Diagnosis not present

## 2020-03-04 DIAGNOSIS — Z7901 Long term (current) use of anticoagulants: Secondary | ICD-10-CM | POA: Diagnosis not present

## 2020-03-04 DIAGNOSIS — I110 Hypertensive heart disease with heart failure: Secondary | ICD-10-CM | POA: Diagnosis not present

## 2020-03-04 DIAGNOSIS — I4891 Unspecified atrial fibrillation: Secondary | ICD-10-CM | POA: Diagnosis not present

## 2020-03-04 DIAGNOSIS — Z7984 Long term (current) use of oral hypoglycemic drugs: Secondary | ICD-10-CM | POA: Diagnosis not present

## 2020-03-04 DIAGNOSIS — M6281 Muscle weakness (generalized): Secondary | ICD-10-CM | POA: Diagnosis not present

## 2020-03-08 ENCOUNTER — Other Ambulatory Visit: Payer: Self-pay | Admitting: Pharmacist

## 2020-03-08 DIAGNOSIS — Z7901 Long term (current) use of anticoagulants: Secondary | ICD-10-CM | POA: Diagnosis not present

## 2020-03-08 DIAGNOSIS — I4891 Unspecified atrial fibrillation: Secondary | ICD-10-CM | POA: Diagnosis not present

## 2020-03-08 DIAGNOSIS — I872 Venous insufficiency (chronic) (peripheral): Secondary | ICD-10-CM | POA: Diagnosis not present

## 2020-03-08 DIAGNOSIS — I110 Hypertensive heart disease with heart failure: Secondary | ICD-10-CM | POA: Diagnosis not present

## 2020-03-08 DIAGNOSIS — Z7984 Long term (current) use of oral hypoglycemic drugs: Secondary | ICD-10-CM | POA: Diagnosis not present

## 2020-03-08 DIAGNOSIS — L89153 Pressure ulcer of sacral region, stage 3: Secondary | ICD-10-CM | POA: Diagnosis not present

## 2020-03-08 DIAGNOSIS — M6281 Muscle weakness (generalized): Secondary | ICD-10-CM | POA: Diagnosis not present

## 2020-03-08 DIAGNOSIS — I509 Heart failure, unspecified: Secondary | ICD-10-CM | POA: Diagnosis not present

## 2020-03-08 DIAGNOSIS — E1151 Type 2 diabetes mellitus with diabetic peripheral angiopathy without gangrene: Secondary | ICD-10-CM | POA: Diagnosis not present

## 2020-03-08 NOTE — Progress Notes (Signed)
Sent in request for DOS 30/01/2020-03/24/2020, to [employer].

## 2020-03-09 DIAGNOSIS — E039 Hypothyroidism, unspecified: Secondary | ICD-10-CM | POA: Diagnosis not present

## 2020-03-09 DIAGNOSIS — Z79899 Other long term (current) drug therapy: Secondary | ICD-10-CM | POA: Diagnosis not present

## 2020-03-09 DIAGNOSIS — T148XXA Other injury of unspecified body region, initial encounter: Secondary | ICD-10-CM | POA: Diagnosis not present

## 2020-03-09 DIAGNOSIS — Z6841 Body Mass Index (BMI) 40.0 and over, adult: Secondary | ICD-10-CM | POA: Diagnosis not present

## 2020-03-09 DIAGNOSIS — E1169 Type 2 diabetes mellitus with other specified complication: Secondary | ICD-10-CM | POA: Diagnosis not present

## 2020-03-09 DIAGNOSIS — E785 Hyperlipidemia, unspecified: Secondary | ICD-10-CM | POA: Diagnosis not present

## 2020-03-09 DIAGNOSIS — I10 Essential (primary) hypertension: Secondary | ICD-10-CM | POA: Diagnosis not present

## 2020-03-09 DIAGNOSIS — E1151 Type 2 diabetes mellitus with diabetic peripheral angiopathy without gangrene: Secondary | ICD-10-CM | POA: Diagnosis not present

## 2020-03-10 DIAGNOSIS — L89153 Pressure ulcer of sacral region, stage 3: Secondary | ICD-10-CM | POA: Diagnosis not present

## 2020-03-10 DIAGNOSIS — E1151 Type 2 diabetes mellitus with diabetic peripheral angiopathy without gangrene: Secondary | ICD-10-CM | POA: Diagnosis not present

## 2020-03-10 DIAGNOSIS — I872 Venous insufficiency (chronic) (peripheral): Secondary | ICD-10-CM | POA: Diagnosis not present

## 2020-03-10 DIAGNOSIS — Z7984 Long term (current) use of oral hypoglycemic drugs: Secondary | ICD-10-CM | POA: Diagnosis not present

## 2020-03-10 DIAGNOSIS — I509 Heart failure, unspecified: Secondary | ICD-10-CM | POA: Diagnosis not present

## 2020-03-10 DIAGNOSIS — I4891 Unspecified atrial fibrillation: Secondary | ICD-10-CM | POA: Diagnosis not present

## 2020-03-10 DIAGNOSIS — Z7901 Long term (current) use of anticoagulants: Secondary | ICD-10-CM | POA: Diagnosis not present

## 2020-03-10 DIAGNOSIS — M6281 Muscle weakness (generalized): Secondary | ICD-10-CM | POA: Diagnosis not present

## 2020-03-10 DIAGNOSIS — I110 Hypertensive heart disease with heart failure: Secondary | ICD-10-CM | POA: Diagnosis not present

## 2020-03-11 DIAGNOSIS — I110 Hypertensive heart disease with heart failure: Secondary | ICD-10-CM | POA: Diagnosis not present

## 2020-03-11 DIAGNOSIS — Z7901 Long term (current) use of anticoagulants: Secondary | ICD-10-CM | POA: Diagnosis not present

## 2020-03-11 DIAGNOSIS — L89153 Pressure ulcer of sacral region, stage 3: Secondary | ICD-10-CM | POA: Diagnosis not present

## 2020-03-11 DIAGNOSIS — Z7984 Long term (current) use of oral hypoglycemic drugs: Secondary | ICD-10-CM | POA: Diagnosis not present

## 2020-03-11 DIAGNOSIS — E1151 Type 2 diabetes mellitus with diabetic peripheral angiopathy without gangrene: Secondary | ICD-10-CM | POA: Diagnosis not present

## 2020-03-11 DIAGNOSIS — I509 Heart failure, unspecified: Secondary | ICD-10-CM | POA: Diagnosis not present

## 2020-03-11 DIAGNOSIS — M6281 Muscle weakness (generalized): Secondary | ICD-10-CM | POA: Diagnosis not present

## 2020-03-11 DIAGNOSIS — I4891 Unspecified atrial fibrillation: Secondary | ICD-10-CM | POA: Diagnosis not present

## 2020-03-11 DIAGNOSIS — I872 Venous insufficiency (chronic) (peripheral): Secondary | ICD-10-CM | POA: Diagnosis not present

## 2020-03-12 DIAGNOSIS — L89153 Pressure ulcer of sacral region, stage 3: Secondary | ICD-10-CM | POA: Diagnosis not present

## 2020-03-12 DIAGNOSIS — M6281 Muscle weakness (generalized): Secondary | ICD-10-CM | POA: Diagnosis not present

## 2020-03-12 DIAGNOSIS — E1151 Type 2 diabetes mellitus with diabetic peripheral angiopathy without gangrene: Secondary | ICD-10-CM | POA: Diagnosis not present

## 2020-03-12 DIAGNOSIS — I4891 Unspecified atrial fibrillation: Secondary | ICD-10-CM | POA: Diagnosis not present

## 2020-03-12 DIAGNOSIS — I110 Hypertensive heart disease with heart failure: Secondary | ICD-10-CM | POA: Diagnosis not present

## 2020-03-12 DIAGNOSIS — Z7901 Long term (current) use of anticoagulants: Secondary | ICD-10-CM | POA: Diagnosis not present

## 2020-03-12 DIAGNOSIS — Z7984 Long term (current) use of oral hypoglycemic drugs: Secondary | ICD-10-CM | POA: Diagnosis not present

## 2020-03-12 DIAGNOSIS — I509 Heart failure, unspecified: Secondary | ICD-10-CM | POA: Diagnosis not present

## 2020-03-12 DIAGNOSIS — I872 Venous insufficiency (chronic) (peripheral): Secondary | ICD-10-CM | POA: Diagnosis not present

## 2020-03-14 ENCOUNTER — Other Ambulatory Visit: Payer: Self-pay | Admitting: Oncology

## 2020-03-14 DIAGNOSIS — Z95828 Presence of other vascular implants and grafts: Secondary | ICD-10-CM

## 2020-03-14 DIAGNOSIS — D46Z Other myelodysplastic syndromes: Secondary | ICD-10-CM

## 2020-03-14 NOTE — Progress Notes (Signed)
[facility]  [address] [place],  [place]  [address] [phone]  Clinic Day:  03/14/2020  Referring physician: Ernestene Kiel, MD   CHIEF COMPLAINT:  CC: Myelodysplasia-excess blasts-1  Current Treatment:   Azacytidine days 1 through 7 every 28 days   HISTORY OF PRESENT ILLNESS:  [name] is a 80 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  Her bone marrow biopsy revealed 5-6% blasts present.  She has completed 3 cycles of azacitidine with improvement in her hemoglobin and has not required a blood transfusion in over 2 months. Prior to her 3rd cycle of azacytidine, she was found to have a violaceous lesion of the left thigh concerning for leukemia cutis.  We referred her for biopsy of this lesion.  She was also referred for physical therapy due to generalized weakness.  INTERVAL HISTORY:  [name] is here today prior to a 4th cycle of azacitidine. She claims to have tolerated her 3rd cycle of treatment well and states she is feeling stronger than previous. She has started home physical therapy.  She states she had biopsy of 2 skin lesions of the left thigh and was told these were not malignant, however, a culture is pending.  She sees the dermatologist again tomorrow.  The biopsy sites are not completely healed, but have overlying eschar.  She also has 2 pressure sores being managed by home health.  They are scheduled to change her dressings today and she did not want me to remove the dressings. She denies fevers or chills. She  denies pain. Her appetite  Has improved. Her weight has been stable.  REVIEW OF SYSTEMS:  Review of Systems  Constitutional: Negative for appetite change, chills, fatigue, fever and unexpected weight change.  HENT:   Negative for lump/mass, mouth sores and sore throat.   Respiratory: Negative for cough and shortness of breath.   Cardiovascular: Negative for chest pain and leg swelling.  Gastrointestinal:  Negative for abdominal pain, constipation, diarrhea, nausea and vomiting.  Genitourinary: Negative for difficulty urinating, dysuria, frequency and hematuria.   Musculoskeletal: Negative for arthralgias, back pain and myalgias.  Skin: Positive for wound (2 biopsy sites left leg). Negative for rash.  Neurological: Negative for dizziness and headaches.  Psychiatric/Behavioral: Negative for decreased concentration. The patient is not nervous/anxious.      VITALS:  There were no vitals taken for this visit.  Wt Readings from Last 3 Encounters:  02/25/20 209 lb 12 oz (95.1 kg)  02/24/20 209 lb 12 oz (95.1 kg)  02/23/20 208 lb 4 oz (94.5 kg)    There is no height or weight on file to calculate BMI.  Performance status (ECOG): 2 - Symptomatic, <50% confined to bed  PHYSICAL EXAM:  Physical Exam Vitals and nursing note reviewed.  HENT:     Head: Normocephalic and atraumatic.     Mouth/Throat:     Mouth: Mucous membranes are moist.     Pharynx: Oropharynx is clear. No oropharyngeal exudate or posterior oropharyngeal erythema.  Eyes:     General: No scleral icterus.    Extraocular Movements: Extraocular movements intact.     Conjunctiva/sclera: Conjunctivae normal.     Pupils: Pupils are equal, round, and reactive to light.  Cardiovascular:     Rate and Rhythm: Normal rate and regular rhythm.     Heart sounds: Normal heart sounds. No murmur heard. No friction rub. No gallop.   Pulmonary:     Effort: No respiratory distress.  Breath sounds: Normal breath sounds. No stridor. No wheezing, rhonchi or rales.  Chest:  Breasts:     Right: No axillary adenopathy or supraclavicular adenopathy.     Left: No axillary adenopathy or supraclavicular adenopathy.    Abdominal:     General: There is no distension.     Palpations: Abdomen is soft. There is no hepatomegaly, splenomegaly or mass.     Tenderness: There is abdominal tenderness (injection sites). There is no guarding.     Hernia:  No hernia is present.  Musculoskeletal:     Cervical back: Normal range of motion and neck supple. No tenderness.     Right lower leg: No edema.     Left lower leg: No edema.  Lymphadenopathy:     Cervical: No cervical adenopathy.     Upper Body:     Right upper body: No supraclavicular or axillary adenopathy.     Left upper body: No supraclavicular or axillary adenopathy.     Lower Body: No right inguinal adenopathy. No left inguinal adenopathy.  Skin:    General: Skin is warm and dry.     Coloration: Skin is jaundiced.     Findings: Lesion (left inner thigh, dressed, left lateral thigh, overlying eschar) present. No rash.  Neurological:     Mental Status: She is oriented to person, place, and time.     Cranial Nerves: No cranial nerve deficit.  Psychiatric:        Mood and Affect: Mood normal.        Behavior: Behavior normal.        Thought Content: Thought content normal.     LABS:   CBC Latest Ref Rng & Units 02/16/2020 01/19/2020 01/13/2020  WBC - 1.2 2.5 0.5  Hemoglobin 12.0 - 16.0 10.2(A) 9.5(A) 9.1(A)  Hematocrit 36 - 46 32(A) 29(A) 28(A)  Platelets 150 - 399 383 344 381   CMP Latest Ref Rng & Units 02/16/2020 01/19/2020 01/13/2020  Glucose 65 - 99 mg/dL - - -  BUN 4 - 21 31(A) 38(A) 37(A)  Creatinine 0.5 - 1.1 1.0 1.3(A) 1.2(A)  Sodium 137 - 147 137 136(A) 139  Potassium 3.4 - 5.3 4.6 4.2 4.3  Chloride 99 - 108 105 103 104  CO2 13 - 22 24(A) 20 26(A)  Calcium 8.7 - 10.7 9.5 8.5(A) 8.9  Alkaline Phos 25 - 125 - - -  AST 13 - 35 - - -  ALT 7 - 35 - - -     No results found for: CEA1 / No results found for: CEA1 No results found for: PSA1 No results found for: ZSW109 No results found for: NAT557  No results found for: TOTALPROTELP, ALBUMINELP, A1GS, A2GS, BETS, BETA2SER, GAMS, MSPIKE, SPEI No results found for: TIBC, FERRITIN, IRONPCTSAT No results found for: LDH  STUDIES:  No results found.    HISTORY:   Past Medical History:  Diagnosis Date  .  Arthritis    SPURS, DDD, NECK  . Diabetes mellitus without complication [facility])    dx  2008 ?  Marland Kitchen Dysrhythmia    afib ([facility], Dr. Bettina Gavia)  . Edema    legs , left leg weeping, is wrapped 10/28/13  . GERD (gastroesophageal reflux disease)   . H/O hiatal hernia   . Headache    h/o 'muscular vascular' headaches  . Hypertension   . Hypothyroidism   . Mucous colitis   . Pacemaker   . Phlebitis    hx of  . Pulmonary hypertension (  HCC)   . Shortness of breath    due to pulmonary hypeertension  . Stroke (HCC)    1998    Past Surgical History:  Procedure Laterality Date  . 25 GAUGE PARS PLANA VITRECTOMY WITH 20 GAUGE MVR PORT FOR MACULAR HOLE Left 11/12/2012   Procedure: 25 GAUGE PARS PLANA VITRECTOMY WITH 20 GAUGE MVR PORT FOR MACULAR HOLE;  Surgeon: John D Matthews, MD;  Location: [facility] OR;  Service: Ophthalmology;  Laterality: Left;  . ABDOMINAL HYSTERECTOMY  1977  . CATARACT EXTRACTION W/PHACO Left 10/29/2013   Procedure: LEFT CATARACT EXTRACTION PHACO AND INTRAOCULAR LENS PLACEMENT (IOC);  Surgeon: Roy Whitaker, MD;  Location: [facility] OR;  Service: Ophthalmology;  Laterality: Left;  . CHOLECYSTECTOMY  1983  . EYE SURGERY    . GAS INSERTION Left 11/12/2012   Procedure: INSERTION OF GAS;  Surgeon: John D Matthews, MD;  Location: [facility] OR;  Service: Ophthalmology;  Laterality: Left;  C3F8 (expires 11/2014)  . INSERT / REPLACE / REMOVE PACEMAKER     2008, DR BRIAN MUNLEY/Dr. Akbary  . KNEE ARTHROSCOPY     1990S  RT   . MEMBRANE PEEL Left 11/12/2012   Procedure: MEMBRANE PEEL;  Surgeon: John D Matthews, MD;  Location: [facility] OR;  Service: Ophthalmology;  Laterality: Left;  . OSTECTOMY Right 09/04/2016   Procedure: OSTECTOMY, COMPLETE METATARSAL HEAD FIFTH RIGHT;  Surgeon: Stover, Titorya, DPM;  Location: [facility] OR;  Service: Podiatry;  Laterality: Right;  . PARS PLANA VITRECTOMY Left 11/12/2012   with macular hole    Dr Matthews  . PHOTOCOAGULATION WITH LASER Left 11/12/2012   Procedure:  HEADSCOPE LASER;  Surgeon: John D Matthews, MD;  Location: [facility] OR;  Service: Ophthalmology;  Laterality: Left;  . SERUM PATCH Left 11/12/2012   Procedure: SERUM PATCH;  Surgeon: John D Matthews, MD;  Location: [facility] OR;  Service: Ophthalmology;  Laterality: Left;  . TONSILLECTOMY      Family History  Problem Relation Age of Onset  . Heart attack Father   . Hypertension Father     Social History:  reports that she has never smoked. She has never used smokeless tobacco. She reports that she does not drink alcohol and does not use drugs.The patient is accompanied by her daughter, [name], today.  Allergies: No Known Allergies  Current Medications: Current Outpatient Medications  Medication Sig Dispense Refill  . acetaminophen (TYLENOL) 500 MG tablet Take 1,000 mg by mouth at bedtime as needed (knee pain).     . allopurinol (ZYLOPRIM) 100 MG tablet Take 100 mg by mouth daily.    . ALPRAZolam (XANAX) 0.25 MG tablet Take 0.25 mg by mouth at bedtime.    . aspirin 81 MG chewable tablet Chew by mouth.    . benazepril (LOTENSIN) 5 MG tablet Take 1 tablet by mouth daily.    . bumetanide (BUMEX) 2 MG tablet Take 1 tablet by mouth 2 (two) times daily.    . cephALEXin (KEFLEX) 500 MG capsule Take 1 capsule (500 mg total) by mouth 2 (two) times daily. 14 capsule 0  . Cholecalciferol (VITAMIN D) 2000 UNITS CAPS Take 2,000 Units by mouth daily.    . docusate sodium (COLACE) 100 MG capsule Take 300 mg by mouth at bedtime.    . estradiol (ESTRACE) 0.5 MG tablet Take 0.5 mg by mouth daily.    . estradiol (ESTRACE) 1 MG tablet     . FLUZONE HIGH-DOSE QUADRIVALENT 0.7 ML SUSY     . furosemide (LASIX) 40 MG tablet TAKE   1 TABLET TWICE DAILY 180 tablet 1  . gabapentin (NEURONTIN) 800 MG tablet Take 1 tablet by mouth daily.    . glipiZIDE (GLUCOTROL XL) 5 MG 24 hr tablet Take by mouth.    . glucose blood (ACCU-CHEK AVIVA PLUS) test strip     . HYDROcodone-acetaminophen (NORCO) 5-325 MG tablet Take 1-2 tablets by  mouth every 4 (four) hours as needed for moderate pain. 120 tablet 0  . Lactobacillus (ACIDOPHILUS) CAPS capsule Take 1 capsule by mouth daily.    . levothyroxine (SYNTHROID, LEVOTHROID) 112 MCG tablet Take 112 mcg by mouth daily before breakfast.     . lovastatin (MEVACOR) 40 MG tablet Take 40 mg by mouth 2 (two) times daily.    . metFORMIN (GLUCOPHAGE) 500 MG tablet Take by mouth.    . metolazone (ZAROXOLYN) 2.5 MG tablet Take 1 tablet by mouth twice weekly on Tuesday and Friday. 8 tablet 3  . metoprolol succinate (TOPROL-XL) 50 MG 24 hr tablet Take 50 mg by mouth daily. Take with or immediately following a meal.    . omeprazole (PRILOSEC) 20 MG capsule Take 20 mg by mouth daily.    . ondansetron (ZOFRAN) 4 MG tablet Take 1 tablet (4 mg total) by mouth every 4 (four) hours as needed for nausea. 90 tablet 3  . polyethylene glycol (MIRALAX / GLYCOLAX) 17 g packet Take 17 g by mouth daily.    . potassium chloride (MICRO-K) 10 MEQ CR capsule Take by mouth.    . prochlorperazine (COMPAZINE) 10 MG tablet Take 1 tablet (10 mg total) by mouth every 6 (six) hours as needed for nausea or vomiting. 90 tablet 3  . tiZANidine (ZANAFLEX) 4 MG capsule Take by mouth.    . warfarin (COUMADIN) 3 MG tablet Take 3-6 mg by mouth daily at 6 PM. Take 2 tablets (6 mg) by mouth on Tuesday, Thursday, Saturday, and Sunday and take 1 tablet (3 mg) on Monday, Wednesday, and Friday     No current facility-administered medications for this visit.     ASSESSMENT & PLAN:   Assessment/Plan:  [name] is a 79 y.o. female  with myelodysplasia-excess blasts -1. We continue to be pleased as her hemoglobin has been rising without needing a blood transfusion.  This suggests her bone marrow is responding to her azacitidine.  The patient asks what would happen if she discontinue treatment at this time and I explained that she would have recurrent anemia and worsening neutropenia if we discontinue treatment before her disease is  well controlled.  After discussion with Dr. Lewis, she will proceed with her 4th cycle of azacitidine this week.  The biopsy of the violaceous lesion of her left thigh was apparently negative for malignancy, but culture is pending.  She sees a dermatologist again tomorrow. The patient and her daughter understand the plans discussed today and are in agreement with them. They know to contact our office if she develops concerns prior to her next appointment.    Dequincy A Lewis, MD       

## 2020-03-15 ENCOUNTER — Inpatient Hospital Stay: Payer: Medicare HMO | Admitting: Oncology

## 2020-03-15 ENCOUNTER — Other Ambulatory Visit: Payer: Self-pay | Admitting: Hematology and Oncology

## 2020-03-15 ENCOUNTER — Other Ambulatory Visit: Payer: Self-pay

## 2020-03-15 ENCOUNTER — Other Ambulatory Visit: Payer: Self-pay | Admitting: Oncology

## 2020-03-15 ENCOUNTER — Inpatient Hospital Stay: Payer: Medicare HMO

## 2020-03-15 VITALS — BP 180/75 | HR 81 | Temp 98.2°F | Resp 16 | Ht 61.0 in | Wt 199.8 lb

## 2020-03-15 DIAGNOSIS — I872 Venous insufficiency (chronic) (peripheral): Secondary | ICD-10-CM | POA: Diagnosis not present

## 2020-03-15 DIAGNOSIS — E1151 Type 2 diabetes mellitus with diabetic peripheral angiopathy without gangrene: Secondary | ICD-10-CM | POA: Diagnosis not present

## 2020-03-15 DIAGNOSIS — I4891 Unspecified atrial fibrillation: Secondary | ICD-10-CM | POA: Diagnosis not present

## 2020-03-15 DIAGNOSIS — D46Z Other myelodysplastic syndromes: Secondary | ICD-10-CM

## 2020-03-15 DIAGNOSIS — L89153 Pressure ulcer of sacral region, stage 3: Secondary | ICD-10-CM | POA: Diagnosis not present

## 2020-03-15 DIAGNOSIS — Z7984 Long term (current) use of oral hypoglycemic drugs: Secondary | ICD-10-CM | POA: Diagnosis not present

## 2020-03-15 DIAGNOSIS — I110 Hypertensive heart disease with heart failure: Secondary | ICD-10-CM | POA: Diagnosis not present

## 2020-03-15 DIAGNOSIS — I509 Heart failure, unspecified: Secondary | ICD-10-CM | POA: Diagnosis not present

## 2020-03-15 DIAGNOSIS — M6281 Muscle weakness (generalized): Secondary | ICD-10-CM | POA: Diagnosis not present

## 2020-03-15 DIAGNOSIS — Z7901 Long term (current) use of anticoagulants: Secondary | ICD-10-CM | POA: Diagnosis not present

## 2020-03-15 LAB — BASIC METABOLIC PANEL
BUN: 43 — AB (ref 4–21)
CO2: 26 — AB (ref 13–22)
Chloride: 100 (ref 99–108)
Creatinine: 1.1 (ref 0.5–1.1)
Glucose: 102
Potassium: 3.7 (ref 3.4–5.3)
Sodium: 136 — AB (ref 137–147)

## 2020-03-15 LAB — CBC AND DIFFERENTIAL
HCT: 33 — AB (ref 36–46)
Hemoglobin: 10.8 — AB (ref 12.0–16.0)
Neutrophils Absolute: 0.67
Platelets: 645 — AB (ref 150–399)
WBC: 1.6

## 2020-03-15 LAB — CBC
Absolute Lymphocytes: 0.67 (ref 0.65–4.75)
MCV: 100 — AB (ref 81–99)
RBC: 3 — AB (ref 3.87–5.11)

## 2020-03-15 LAB — COMPREHENSIVE METABOLIC PANEL: Calcium: 8.7 (ref 8.7–10.7)

## 2020-03-16 ENCOUNTER — Inpatient Hospital Stay: Payer: Medicare HMO | Attending: Oncology

## 2020-03-16 VITALS — BP 156/67 | HR 79 | Temp 97.4°F | Resp 18 | Ht 61.0 in | Wt 194.1 lb

## 2020-03-16 DIAGNOSIS — Z5111 Encounter for antineoplastic chemotherapy: Secondary | ICD-10-CM | POA: Diagnosis not present

## 2020-03-16 DIAGNOSIS — D4621 Refractory anemia with excess of blasts 1: Secondary | ICD-10-CM | POA: Diagnosis not present

## 2020-03-16 DIAGNOSIS — D46Z Other myelodysplastic syndromes: Secondary | ICD-10-CM

## 2020-03-16 MED ORDER — AZACITIDINE CHEMO SQ INJECTION
75.0000 mg/m2 | Freq: Once | INTRAMUSCULAR | Status: AC
  Administered 2020-03-16: 152.5 mg via SUBCUTANEOUS
  Filled 2020-03-16: qty 6.1

## 2020-03-16 MED ORDER — ONDANSETRON HCL 8 MG PO TABS: 8.0000 mg | ORAL_TABLET | Freq: Once | ORAL | Status: DC

## 2020-03-16 NOTE — Patient Instructions (Signed)
Azacitidine suspension for injection (subcutaneous use) What is this medicine? AZACITIDINE (ay za SITE i deen) is a chemotherapy drug. This medicine reduces the growth of cancer cells and can suppress the immune system. It is used for treating myelodysplastic syndrome or some types of leukemia. This medicine may be used for other purposes; ask your health care provider or pharmacist if you have questions. COMMON BRAND NAME(S): Vidaza What should I tell my health care provider before I take this medicine? They need to know if you have any of these conditions:  kidney disease  liver disease  liver tumors  an unusual or allergic reaction to azacitidine, mannitol, other medicines, foods, dyes, or preservatives  pregnant or trying to get pregnant  breast-feeding How should I use this medicine? This medicine is for injection under the skin. It is administered in a hospital or clinic by a specially trained health care professional. Talk to your pediatrician regarding the use of this medicine in children. While this drug may be prescribed for selected conditions, precautions do apply. Overdosage: If you think you have taken too much of this medicine contact a poison control center or emergency room at once. NOTE: This medicine is only for you. Do not share this medicine with others. What if I miss a dose? It is important not to miss your dose. Call your doctor or health care professional if you are unable to keep an appointment. What may interact with this medicine? Interactions have not been studied. Give your health care provider a list of all the medicines, herbs, non-prescription drugs, or dietary supplements you use. Also tell them if you smoke, drink alcohol, or use illegal drugs. Some items may interact with your medicine. This list may not describe all possible interactions. Give your health care provider a list of all the medicines, herbs, non-prescription drugs, or dietary supplements  you use. Also tell them if you smoke, drink alcohol, or use illegal drugs. Some items may interact with your medicine. What should I watch for while using this medicine? Visit your doctor for checks on your progress. This drug may make you feel generally unwell. This is not uncommon, as chemotherapy can affect healthy cells as well as cancer cells. Report any side effects. Continue your course of treatment even though you feel ill unless your doctor tells you to stop. In some cases, you may be given additional medicines to help with side effects. Follow all directions for their use. Call your doctor or health care professional for advice if you get a fever, chills or sore throat, or other symptoms of a cold or flu. Do not treat yourself. This drug decreases your body's ability to fight infections. Try to avoid being around people who are sick. This medicine may increase your risk to bruise or bleed. Call your doctor or health care professional if you notice any unusual bleeding. You may need blood work done while you are taking this medicine. Do not become pregnant while taking this medicine and for 6 months after the last dose. Women should inform their doctor if they wish to become pregnant or think they might be pregnant. Men should not father a child while taking this medicine and for 3 months after the last dose. There is a potential for serious side effects to an unborn child. Talk to your health care professional or pharmacist for more information. Do not breast-feed an infant while taking this medicine and for 1 week after the last dose. This medicine may interfere with   the ability to have a child. Talk with your doctor or health care professional if you are concerned about your fertility. What side effects may I notice from receiving this medicine? Side effects that you should report to your doctor or health care professional as soon as possible:  allergic reactions like skin rash, itching or  hives, swelling of the face, lips, or tongue  low blood counts - this medicine may decrease the number of white blood cells, red blood cells and platelets. You may be at increased risk for infections and bleeding.  signs of infection - fever or chills, cough, sore throat, pain passing urine  signs of decreased platelets or bleeding - bruising, pinpoint red spots on the skin, black, tarry stools, blood in the urine  signs of decreased red blood cells - unusually weak or tired, fainting spells, lightheadedness  signs and symptoms of kidney injury like trouble passing urine or change in the amount of urine  signs and symptoms of liver injury like dark yellow or brown urine; general ill feeling or flu-like symptoms; light-colored stools; loss of appetite; nausea; right upper belly pain; unusually weak or tired; yellowing of the eyes or skin Side effects that usually do not require medical attention (report to your doctor or health care professional if they continue or are bothersome):  constipation  diarrhea  nausea, vomiting  pain or redness at the injection site  unusually weak or tired This list may not describe all possible side effects. Call your doctor for medical advice about side effects. You may report side effects to FDA at 1-800-FDA-1088. Where should I keep my medicine? This drug is given in a hospital or clinic and will not be stored at home. NOTE: This sheet is a summary. It may not cover all possible information. If you have questions about this medicine, talk to your doctor, pharmacist, or health care provider.  2021 Elsevier/Gold Standard (2016-02-01 14:37:51)  

## 2020-03-17 ENCOUNTER — Inpatient Hospital Stay: Payer: Medicare HMO

## 2020-03-17 DIAGNOSIS — Z7984 Long term (current) use of oral hypoglycemic drugs: Secondary | ICD-10-CM | POA: Diagnosis not present

## 2020-03-17 DIAGNOSIS — I509 Heart failure, unspecified: Secondary | ICD-10-CM | POA: Diagnosis not present

## 2020-03-17 DIAGNOSIS — L89153 Pressure ulcer of sacral region, stage 3: Secondary | ICD-10-CM | POA: Diagnosis not present

## 2020-03-17 DIAGNOSIS — M6281 Muscle weakness (generalized): Secondary | ICD-10-CM | POA: Diagnosis not present

## 2020-03-17 DIAGNOSIS — E1151 Type 2 diabetes mellitus with diabetic peripheral angiopathy without gangrene: Secondary | ICD-10-CM | POA: Diagnosis not present

## 2020-03-17 DIAGNOSIS — I4891 Unspecified atrial fibrillation: Secondary | ICD-10-CM | POA: Diagnosis not present

## 2020-03-17 DIAGNOSIS — I872 Venous insufficiency (chronic) (peripheral): Secondary | ICD-10-CM | POA: Diagnosis not present

## 2020-03-17 DIAGNOSIS — I110 Hypertensive heart disease with heart failure: Secondary | ICD-10-CM | POA: Diagnosis not present

## 2020-03-17 DIAGNOSIS — Z7901 Long term (current) use of anticoagulants: Secondary | ICD-10-CM | POA: Diagnosis not present

## 2020-03-18 ENCOUNTER — Other Ambulatory Visit: Payer: Self-pay

## 2020-03-18 ENCOUNTER — Inpatient Hospital Stay: Payer: Medicare HMO

## 2020-03-18 VITALS — BP 136/62 | HR 78 | Temp 98.6°F | Resp 18 | Ht 61.0 in | Wt 196.8 lb

## 2020-03-18 DIAGNOSIS — D4621 Refractory anemia with excess of blasts 1: Secondary | ICD-10-CM | POA: Diagnosis not present

## 2020-03-18 DIAGNOSIS — D46Z Other myelodysplastic syndromes: Secondary | ICD-10-CM

## 2020-03-18 DIAGNOSIS — Z5111 Encounter for antineoplastic chemotherapy: Secondary | ICD-10-CM | POA: Diagnosis not present

## 2020-03-18 MED ORDER — AZACITIDINE CHEMO SQ INJECTION
75.0000 mg/m2 | Freq: Once | INTRAMUSCULAR | Status: AC
  Administered 2020-03-18: 152.5 mg via SUBCUTANEOUS
  Filled 2020-03-18 (×2): qty 6.1

## 2020-03-18 MED ORDER — ONDANSETRON HCL 8 MG PO TABS: 8.0000 mg | ORAL_TABLET | Freq: Once | ORAL | Status: DC

## 2020-03-18 NOTE — Patient Instructions (Signed)
[facility] Discharge Instructions for Patients Receiving Chemotherapy  Today you received the following chemotherapy agents Azacitadine  To help prevent nausea and vomiting after your treatment, we encourage you to take your nausea medication as directed.   If you develop nausea and vomiting that is not controlled by your nausea medication, call the clinic.   BELOW ARE SYMPTOMS THAT SHOULD BE REPORTED IMMEDIATELY:  *FEVER GREATER THAN 100.5 F  *CHILLS WITH OR WITHOUT FEVER  NAUSEA AND VOMITING THAT IS NOT CONTROLLED WITH YOUR NAUSEA MEDICATION  *UNUSUAL SHORTNESS OF BREATH  *UNUSUAL BRUISING OR BLEEDING  TENDERNESS IN MOUTH AND THROAT WITH OR WITHOUT PRESENCE OF ULCERS  *URINARY PROBLEMS  *BOWEL PROBLEMS  UNUSUAL RASH Items with * indicate a potential emergency and should be followed up as soon as possible.  Feel free to call the clinic should you have any questions or concerns at The clinic phone number is [phone].  Please show the CHEMO ALERT CARD at check-in to the Emergency Department and triage nurse.   

## 2020-03-19 ENCOUNTER — Telehealth: Payer: Self-pay

## 2020-03-19 ENCOUNTER — Inpatient Hospital Stay: Payer: Medicare HMO

## 2020-03-19 DIAGNOSIS — Z6838 Body mass index (BMI) 38.0-38.9, adult: Secondary | ICD-10-CM | POA: Diagnosis not present

## 2020-03-19 DIAGNOSIS — I10 Essential (primary) hypertension: Secondary | ICD-10-CM | POA: Diagnosis not present

## 2020-03-19 DIAGNOSIS — Z7901 Long term (current) use of anticoagulants: Secondary | ICD-10-CM | POA: Diagnosis not present

## 2020-03-19 DIAGNOSIS — Z1331 Encounter for screening for depression: Secondary | ICD-10-CM | POA: Diagnosis not present

## 2020-03-19 DIAGNOSIS — I890 Lymphedema, not elsewhere classified: Secondary | ICD-10-CM | POA: Diagnosis not present

## 2020-03-19 DIAGNOSIS — L989 Disorder of the skin and subcutaneous tissue, unspecified: Secondary | ICD-10-CM | POA: Diagnosis not present

## 2020-03-19 NOTE — Telephone Encounter (Signed)
CLICKED ON THIS BY ERROR

## 2020-03-19 NOTE — Progress Notes (Signed)
Sent in request for DOS 03/25/20 and 03/26/20 to [employer].

## 2020-03-19 NOTE — Telephone Encounter (Signed)
I spoke with pt. She is having a lot of dizziness. It was really bad Wednesday & today. She has an appt w/PCP today @ 12n. I see dizziness is a side effect of Vidaza according to chemo care web site. She cancelled her appt today. Please advise.   Per Estill Dooms, pharmacist: I don't think it is the vidaza because she has had 4 prior cycles without dizziness. I'm interested in what the PCP will say today.  03/19/20 @ 1044, I called pt back, & notified her of Susan's response. Pt did admit that her ears had been popping some. She will see her PCP for evaluation. Pt verbalized understanding. - Amy C.

## 2020-03-20 NOTE — Progress Notes (Signed)
[name]  [address] [place],  [place]  [address] [phone]  Clinic Day:  03/20/2020  Referring physician: Ernestene Kiel, MD   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  Her bone marrow biopsy revealed 5-6% blasts present.  She comes in today to be evaluated before heading into her 5th cycle of azacitidine.  The patient claims to have tolerated her 4th cycle of treatment fairly well.  With respect to her myelodysplasia, she has not needed a blood transfusion months.  Her weakness has improved.  However, she is developing more skin lesions over her body that are becoming exquisitely painful.  She has one of these lesions biopsied, which fortunately did not show leukemia cutis.  Cultures, including fungal testing, are still pending.  She has been on doxycycline for a week, but her skin lesions remain very prominent and painful.  PHYSICAL EXAM:  Blood pressure (!) 180/75, pulse 81, temperature 98.2 F (36.8 C), resp. rate 16, height 5' 1" (1.549 m), weight 199 lb 12.8 oz (90.6 kg), SpO2 95 %. Wt Readings from Last 3 Encounters:  03/18/20 196 lb 12 oz (89.2 kg)  03/16/20 194 lb 1.9 oz (88.1 kg)  03/15/20 199 lb 12.8 oz (90.6 kg)   Body mass index is 37.75 kg/m. Performance status (ECOG): 1 Physical Exam Constitutional:      Appearance: Normal appearance. She is not ill-appearing.  HENT:     Mouth/Throat:     Mouth: Mucous membranes are moist.     Pharynx: Oropharynx is clear. No oropharyngeal exudate or posterior oropharyngeal erythema.  Cardiovascular:     Rate and Rhythm: Normal rate and regular rhythm.     Heart sounds: No murmur heard. No friction rub. No gallop.   Pulmonary:     Effort: Pulmonary effort is normal. No respiratory distress.     Breath sounds: Normal breath sounds. No wheezing, rhonchi or rales.  Chest:  Breasts:     Right: No axillary adenopathy or  supraclavicular adenopathy.     Left: No axillary adenopathy or supraclavicular adenopathy.    Abdominal:     General: Bowel sounds are normal. There is no distension.     Palpations: Abdomen is soft. There is no mass.     Tenderness: There is no abdominal tenderness.  Musculoskeletal:        General: No swelling.     Right lower leg: No edema.     Left lower leg: No edema.  Lymphadenopathy:     Cervical: No cervical adenopathy.     Upper Body:     Right upper body: No supraclavicular or axillary adenopathy.     Left upper body: No supraclavicular or axillary adenopathy.     Lower Body: No right inguinal adenopathy. No left inguinal adenopathy.  Skin:    General: Skin is warm.     Coloration: Skin is not jaundiced.     Findings: Lesion (multiple violaceous lesions on her left thigh and trunk) present. No rash.  Neurological:     General: No focal deficit present.     Mental Status: She is alert and oriented to person, place, and time. Mental status is at baseline.     Cranial Nerves: Cranial nerves are intact.  Psychiatric:        Mood and Affect: Mood normal.        Behavior: Behavior normal.        Thought Content:  Thought content normal.     LABS:   CBC Latest Ref Rng & Units 03/15/2020 02/16/2020 01/19/2020  WBC - 1.6 1.2 2.5  Hemoglobin 12.0 - 16.0 10.8(A) 10.2(A) 9.5(A)  Hematocrit 36 - 46 33(A) 32(A) 29(A)  Platelets 150 - 399 645(A) 383 344   CMP Latest Ref Rng & Units 03/15/2020 02/16/2020 01/19/2020  Glucose 65 - 99 mg/dL - - -  BUN 4 - 21 43(A) 31(A) 38(A)  Creatinine 0.5 - 1.1 1.1 1.0 1.3(A)  Sodium 137 - 147 136(A) 137 136(A)  Potassium 3.4 - 5.3 3.7 4.6 4.2  Chloride 99 - 108 100 105 103  CO2 13 - 22 26(A) 24(A) 20  Calcium 8.7 - 10.7 8.7 9.5 8.5(A)  Alkaline Phos 25 - 125 - - -  AST 13 - 35 - - -  ALT 7 - 35 - - -    ASSESSMENT & PLAN:  Assessment/Plan:  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  She will proceed with her 5th cycle of  azacitidine this week.  I am pleased as her hemoglobin has been rising with each successive cycle of treatment.  Her platelets have also exponentially risen.  Her white count remains low, which is likely the reason why she is having her skin infections.  This patient will likely need multiple weeks of antibiotics vs antifungals are her skin lesions are becoming more diffuse and systemic in nature.  I definitely would not be opposed to a PICC line being placed, through which multiple weeks of IV therapy could be given.  I will see her back in 4 weeks before she heads into her 6th cycle of azacitidine.  The patient understands all the plans discussed today and is in agreement with them.    Dequincy Macarthur Critchley, MD

## 2020-03-22 ENCOUNTER — Inpatient Hospital Stay: Payer: Medicare HMO

## 2020-03-22 ENCOUNTER — Other Ambulatory Visit: Payer: Self-pay

## 2020-03-22 VITALS — BP 148/67 | HR 80 | Temp 97.7°F | Resp 18 | Ht 61.0 in | Wt 196.0 lb

## 2020-03-22 DIAGNOSIS — I110 Hypertensive heart disease with heart failure: Secondary | ICD-10-CM | POA: Diagnosis not present

## 2020-03-22 DIAGNOSIS — Z7901 Long term (current) use of anticoagulants: Secondary | ICD-10-CM | POA: Diagnosis not present

## 2020-03-22 DIAGNOSIS — D46Z Other myelodysplastic syndromes: Secondary | ICD-10-CM

## 2020-03-22 DIAGNOSIS — E1151 Type 2 diabetes mellitus with diabetic peripheral angiopathy without gangrene: Secondary | ICD-10-CM | POA: Diagnosis not present

## 2020-03-22 DIAGNOSIS — I509 Heart failure, unspecified: Secondary | ICD-10-CM | POA: Diagnosis not present

## 2020-03-22 DIAGNOSIS — Z7984 Long term (current) use of oral hypoglycemic drugs: Secondary | ICD-10-CM | POA: Diagnosis not present

## 2020-03-22 DIAGNOSIS — I872 Venous insufficiency (chronic) (peripheral): Secondary | ICD-10-CM | POA: Diagnosis not present

## 2020-03-22 DIAGNOSIS — I4891 Unspecified atrial fibrillation: Secondary | ICD-10-CM | POA: Diagnosis not present

## 2020-03-22 DIAGNOSIS — Z5111 Encounter for antineoplastic chemotherapy: Secondary | ICD-10-CM | POA: Diagnosis not present

## 2020-03-22 DIAGNOSIS — L89153 Pressure ulcer of sacral region, stage 3: Secondary | ICD-10-CM | POA: Diagnosis not present

## 2020-03-22 DIAGNOSIS — D4621 Refractory anemia with excess of blasts 1: Secondary | ICD-10-CM | POA: Diagnosis not present

## 2020-03-22 DIAGNOSIS — M6281 Muscle weakness (generalized): Secondary | ICD-10-CM | POA: Diagnosis not present

## 2020-03-22 MED ORDER — ONDANSETRON HCL 8 MG PO TABS: 8.0000 mg | ORAL_TABLET | Freq: Once | ORAL | Status: DC

## 2020-03-22 MED ORDER — AZACITIDINE CHEMO SQ INJECTION
75.0000 mg/m2 | Freq: Once | INTRAMUSCULAR | Status: AC
  Administered 2020-03-22: 152.5 mg via SUBCUTANEOUS
  Filled 2020-03-22 (×2): qty 6.1

## 2020-03-22 NOTE — Progress Notes (Signed)
Pt d/c stable at 1150

## 2020-03-22 NOTE — Patient Instructions (Signed)
[facility] Discharge Instructions for Patients Receiving Chemotherapy  Today you received the following chemotherapy agents Azacitidine  To help prevent nausea and vomiting after your treatment, we encourage you to take your nausea medication as directed   If you develop nausea and vomiting that is not controlled by your nausea medication, call the clinic.   BELOW ARE SYMPTOMS THAT SHOULD BE REPORTED IMMEDIATELY:  *FEVER GREATER THAN 100.5 F  *CHILLS WITH OR WITHOUT FEVER  NAUSEA AND VOMITING THAT IS NOT CONTROLLED WITH YOUR NAUSEA MEDICATION  *UNUSUAL SHORTNESS OF BREATH  *UNUSUAL BRUISING OR BLEEDING  TENDERNESS IN MOUTH AND THROAT WITH OR WITHOUT PRESENCE OF ULCERS  *URINARY PROBLEMS  *BOWEL PROBLEMS  UNUSUAL RASH Items with * indicate a potential emergency and should be followed up as soon as possible.  Feel free to call the clinic should you have any questions or concerns at The clinic phone number is [phone].  Please show the [facility] at check-in to the Emergency Department and triage nurse.  Azacitidine suspension for injection (subcutaneous use) What is this medicine? AZACITIDINE (ay [place]) is a chemotherapy drug. This medicine reduces the growth of cancer cells and can suppress the immune system. It is used for treating myelodysplastic syndrome or some types of leukemia. This medicine may be used for other purposes; ask your health care provider or pharmacist if you have questions. COMMON BRAND NAME(S): Vidaza What should I tell my health care provider before I take this medicine? They need to know if you have any of these conditions:  kidney disease  liver disease  liver tumors  an unusual or allergic reaction to azacitidine, mannitol, other medicines, foods, dyes, or preservatives  pregnant or trying to get pregnant  breast-feeding How should I use this medicine? This medicine is for injection under the  skin. It is administered in a hospital or clinic by a specially trained health care professional. Talk to your pediatrician regarding the use of this medicine in children. While this drug may be prescribed for selected conditions, precautions do apply. Overdosage: If you think you have taken too much of this medicine contact a poison control center or emergency room at once. NOTE: This medicine is only for you. Do not share this medicine with others. What if I miss a dose? It is important not to miss your dose. Call your doctor or health care professional if you are unable to keep an appointment. What may interact with this medicine? Interactions have not been studied. Give your health care provider a list of all the medicines, herbs, non-prescription drugs, or dietary supplements you use. Also tell them if you smoke, drink alcohol, or use illegal drugs. Some items may interact with your medicine. This list may not describe all possible interactions. Give your health care provider a list of all the medicines, herbs, non-prescription drugs, or dietary supplements you use. Also tell them if you smoke, drink alcohol, or use illegal drugs. Some items may interact with your medicine. What should I watch for while using this medicine? Visit your doctor for checks on your progress. This drug may make you feel generally unwell. This is not uncommon, as chemotherapy can affect healthy cells as well as cancer cells. Report any side effects. Continue your course of treatment even though you feel ill unless your doctor tells you to stop. In some cases, you may be given additional medicines to help with side effects. Follow all directions for their  use. Call your doctor or health care professional for advice if you get a fever, chills or sore throat, or other symptoms of a cold or flu. Do not treat yourself. This drug decreases your body's ability to fight infections. Try to avoid being around people who are  sick. This medicine may increase your risk to bruise or bleed. Call your doctor or health care professional if you notice any unusual bleeding. You may need blood work done while you are taking this medicine. Do not become pregnant while taking this medicine and for 6 months after the last dose. Women should inform their doctor if they wish to become pregnant or think they might be pregnant. Men should not father a child while taking this medicine and for 3 months after the last dose. There is a potential for serious side effects to an unborn child. Talk to your health care professional or pharmacist for more information. Do not breast-feed an infant while taking this medicine and for 1 week after the last dose. This medicine may interfere with the ability to have a child. Talk with your doctor or health care professional if you are concerned about your fertility. What side effects may I notice from receiving this medicine? Side effects that you should report to your doctor or health care professional as soon as possible:  allergic reactions like skin rash, itching or hives, swelling of the face, lips, or tongue  low blood counts - this medicine may decrease the number of white blood cells, red blood cells and platelets. You may be at increased risk for infections and bleeding.  signs of infection - fever or chills, cough, sore throat, pain passing urine  signs of decreased platelets or bleeding - bruising, pinpoint red spots on the skin, black, tarry stools, blood in the urine  signs of decreased red blood cells - unusually weak or tired, fainting spells, lightheadedness  signs and symptoms of kidney injury like trouble passing urine or change in the amount of urine  signs and symptoms of liver injury like dark yellow or brown urine; general ill feeling or flu-like symptoms; light-colored stools; loss of appetite; nausea; right upper belly pain; unusually weak or tired; yellowing of the eyes or  skin Side effects that usually do not require medical attention (report to your doctor or health care professional if they continue or are bothersome):  constipation  diarrhea  nausea, vomiting  pain or redness at the injection site  unusually weak or tired This list may not describe all possible side effects. Call your doctor for medical advice about side effects. You may report side effects to FDA at 1-800-FDA-1088. Where should I keep my medicine? This drug is given in a hospital or clinic and will not be stored at home. NOTE: This sheet is a summary. It may not cover all possible information. If you have questions about this medicine, talk to your doctor, pharmacist, or health care provider.  2021 Elsevier/Gold Standard (2016-02-01 14:37:51)

## 2020-03-23 ENCOUNTER — Inpatient Hospital Stay: Payer: Medicare HMO

## 2020-03-23 VITALS — BP 161/69 | HR 82 | Temp 97.9°F | Resp 18 | Ht 61.0 in | Wt 195.1 lb

## 2020-03-23 DIAGNOSIS — D4621 Refractory anemia with excess of blasts 1: Secondary | ICD-10-CM | POA: Diagnosis not present

## 2020-03-23 DIAGNOSIS — Z5111 Encounter for antineoplastic chemotherapy: Secondary | ICD-10-CM | POA: Diagnosis not present

## 2020-03-23 DIAGNOSIS — D46Z Other myelodysplastic syndromes: Secondary | ICD-10-CM

## 2020-03-23 MED ORDER — ONDANSETRON HCL 8 MG PO TABS: 8.0000 mg | ORAL_TABLET | Freq: Once | ORAL | Status: DC

## 2020-03-23 MED ORDER — AZACITIDINE CHEMO SQ INJECTION
75.0000 mg/m2 | Freq: Once | INTRAMUSCULAR | Status: AC
  Administered 2020-03-23: 152.5 mg via SUBCUTANEOUS
  Filled 2020-03-23: qty 6.1

## 2020-03-23 NOTE — Progress Notes (Signed)
Patient d/c stable at 1145

## 2020-03-23 NOTE — Patient Instructions (Signed)
[facility] Discharge Instructions for Patients Receiving Chemotherapy  Today you received the following chemotherapy agents Azacitidine  To help prevent nausea and vomiting after your treatment, we encourage you to take your nausea medication as directed.  If you develop nausea and vomiting that is not controlled by your nausea medication, call the clinic.   BELOW ARE SYMPTOMS THAT SHOULD BE REPORTED IMMEDIATELY:  *FEVER GREATER THAN 100.5 F  *CHILLS WITH OR WITHOUT FEVER  NAUSEA AND VOMITING THAT IS NOT CONTROLLED WITH YOUR NAUSEA MEDICATION  *UNUSUAL SHORTNESS OF BREATH  *UNUSUAL BRUISING OR BLEEDING  TENDERNESS IN MOUTH AND THROAT WITH OR WITHOUT PRESENCE OF ULCERS  *URINARY PROBLEMS  *BOWEL PROBLEMS  UNUSUAL RASH Items with * indicate a potential emergency and should be followed up as soon as possible.  Feel free to call the clinic should you have any questions or concerns at The clinic phone number is [phone].  Please show the [facility] at check-in to the Emergency Department and triage nurse.  Azacitidine suspension for injection (subcutaneous use) What is this medicine? AZACITIDINE (ay [place]) is a chemotherapy drug. This medicine reduces the growth of cancer cells and can suppress the immune system. It is used for treating myelodysplastic syndrome or some types of leukemia. This medicine may be used for other purposes; ask your health care provider or pharmacist if you have questions. COMMON BRAND NAME(S): Vidaza What should I tell my health care provider before I take this medicine? They need to know if you have any of these conditions:  kidney disease  liver disease  liver tumors  an unusual or allergic reaction to azacitidine, mannitol, other medicines, foods, dyes, or preservatives  pregnant or trying to get pregnant  breast-feeding How should I use this medicine? This medicine is for injection under the  skin. It is administered in a hospital or clinic by a specially trained health care professional. Talk to your pediatrician regarding the use of this medicine in children. While this drug may be prescribed for selected conditions, precautions do apply. Overdosage: If you think you have taken too much of this medicine contact a poison control center or emergency room at once. NOTE: This medicine is only for you. Do not share this medicine with others. What if I miss a dose? It is important not to miss your dose. Call your doctor or health care professional if you are unable to keep an appointment. What may interact with this medicine? Interactions have not been studied. Give your health care provider a list of all the medicines, herbs, non-prescription drugs, or dietary supplements you use. Also tell them if you smoke, drink alcohol, or use illegal drugs. Some items may interact with your medicine. This list may not describe all possible interactions. Give your health care provider a list of all the medicines, herbs, non-prescription drugs, or dietary supplements you use. Also tell them if you smoke, drink alcohol, or use illegal drugs. Some items may interact with your medicine. What should I watch for while using this medicine? Visit your doctor for checks on your progress. This drug may make you feel generally unwell. This is not uncommon, as chemotherapy can affect healthy cells as well as cancer cells. Report any side effects. Continue your course of treatment even though you feel ill unless your doctor tells you to stop. In some cases, you may be given additional medicines to help with side effects. Follow all directions for their use.  Call your doctor or health care professional for advice if you get a fever, chills or sore throat, or other symptoms of a cold or flu. Do not treat yourself. This drug decreases your body's ability to fight infections. Try to avoid being around people who are  sick. This medicine may increase your risk to bruise or bleed. Call your doctor or health care professional if you notice any unusual bleeding. You may need blood work done while you are taking this medicine. Do not become pregnant while taking this medicine and for 6 months after the last dose. Women should inform their doctor if they wish to become pregnant or think they might be pregnant. Men should not father a child while taking this medicine and for 3 months after the last dose. There is a potential for serious side effects to an unborn child. Talk to your health care professional or pharmacist for more information. Do not breast-feed an infant while taking this medicine and for 1 week after the last dose. This medicine may interfere with the ability to have a child. Talk with your doctor or health care professional if you are concerned about your fertility. What side effects may I notice from receiving this medicine? Side effects that you should report to your doctor or health care professional as soon as possible:  allergic reactions like skin rash, itching or hives, swelling of the face, lips, or tongue  low blood counts - this medicine may decrease the number of white blood cells, red blood cells and platelets. You may be at increased risk for infections and bleeding.  signs of infection - fever or chills, cough, sore throat, pain passing urine  signs of decreased platelets or bleeding - bruising, pinpoint red spots on the skin, black, tarry stools, blood in the urine  signs of decreased red blood cells - unusually weak or tired, fainting spells, lightheadedness  signs and symptoms of kidney injury like trouble passing urine or change in the amount of urine  signs and symptoms of liver injury like dark yellow or brown urine; general ill feeling or flu-like symptoms; light-colored stools; loss of appetite; nausea; right upper belly pain; unusually weak or tired; yellowing of the eyes or  skin Side effects that usually do not require medical attention (report to your doctor or health care professional if they continue or are bothersome):  constipation  diarrhea  nausea, vomiting  pain or redness at the injection site  unusually weak or tired This list may not describe all possible side effects. Call your doctor for medical advice about side effects. You may report side effects to FDA at 1-800-FDA-1088. Where should I keep my medicine? This drug is given in a hospital or clinic and will not be stored at home. NOTE: This sheet is a summary. It may not cover all possible information. If you have questions about this medicine, talk to your doctor, pharmacist, or health care provider.  2021 Elsevier/Gold Standard (2016-02-01 14:37:51)

## 2020-03-24 ENCOUNTER — Inpatient Hospital Stay: Payer: Medicare HMO

## 2020-03-24 ENCOUNTER — Other Ambulatory Visit: Payer: Self-pay

## 2020-03-24 VITALS — BP 177/72 | HR 88 | Temp 98.1°F | Resp 18 | Ht 61.0 in | Wt 195.1 lb

## 2020-03-24 DIAGNOSIS — E119 Type 2 diabetes mellitus without complications: Secondary | ICD-10-CM | POA: Diagnosis not present

## 2020-03-24 DIAGNOSIS — L89154 Pressure ulcer of sacral region, stage 4: Secondary | ICD-10-CM | POA: Diagnosis not present

## 2020-03-24 DIAGNOSIS — I890 Lymphedema, not elsewhere classified: Secondary | ICD-10-CM | POA: Diagnosis not present

## 2020-03-24 DIAGNOSIS — D709 Neutropenia, unspecified: Secondary | ICD-10-CM | POA: Diagnosis not present

## 2020-03-24 DIAGNOSIS — D46Z Other myelodysplastic syndromes: Secondary | ICD-10-CM

## 2020-03-24 DIAGNOSIS — T8131XD Disruption of external operation (surgical) wound, not elsewhere classified, subsequent encounter: Secondary | ICD-10-CM | POA: Diagnosis not present

## 2020-03-24 DIAGNOSIS — Z5111 Encounter for antineoplastic chemotherapy: Secondary | ICD-10-CM | POA: Diagnosis not present

## 2020-03-24 DIAGNOSIS — Z6838 Body mass index (BMI) 38.0-38.9, adult: Secondary | ICD-10-CM | POA: Diagnosis not present

## 2020-03-24 DIAGNOSIS — D4621 Refractory anemia with excess of blasts 1: Secondary | ICD-10-CM | POA: Diagnosis not present

## 2020-03-24 DIAGNOSIS — I872 Venous insufficiency (chronic) (peripheral): Secondary | ICD-10-CM | POA: Diagnosis not present

## 2020-03-24 DIAGNOSIS — S81802D Unspecified open wound, left lower leg, subsequent encounter: Secondary | ICD-10-CM | POA: Diagnosis not present

## 2020-03-24 DIAGNOSIS — L89223 Pressure ulcer of left hip, stage 3: Secondary | ICD-10-CM | POA: Diagnosis not present

## 2020-03-24 DIAGNOSIS — I509 Heart failure, unspecified: Secondary | ICD-10-CM | POA: Diagnosis not present

## 2020-03-24 DIAGNOSIS — I4891 Unspecified atrial fibrillation: Secondary | ICD-10-CM | POA: Diagnosis not present

## 2020-03-24 MED ORDER — ONDANSETRON HCL 8 MG PO TABS: 8.0000 mg | ORAL_TABLET | Freq: Once | ORAL | Status: DC

## 2020-03-24 MED ORDER — AZACITIDINE CHEMO SQ INJECTION
75.0000 mg/m2 | Freq: Once | INTRAMUSCULAR | Status: AC
  Administered 2020-03-24: 152.5 mg via SUBCUTANEOUS
  Filled 2020-03-24: qty 6.1

## 2020-03-24 NOTE — Progress Notes (Signed)
Pt d/c stable at 1139

## 2020-03-24 NOTE — Patient Instructions (Signed)
[facility] Discharge Instructions for Patients Receiving Chemotherapy  Today you received the following chemotherapy agents Azacitidine  To help prevent nausea and vomiting after your treatment, we encourage you to take your nausea medication as directed.   If you develop nausea and vomiting that is not controlled by your nausea medication, call the clinic.   BELOW ARE SYMPTOMS THAT SHOULD BE REPORTED IMMEDIATELY:  *FEVER GREATER THAN 100.5 F  *CHILLS WITH OR WITHOUT FEVER  NAUSEA AND VOMITING THAT IS NOT CONTROLLED WITH YOUR NAUSEA MEDICATION  *UNUSUAL SHORTNESS OF BREATH  *UNUSUAL BRUISING OR BLEEDING  TENDERNESS IN MOUTH AND THROAT WITH OR WITHOUT PRESENCE OF ULCERS  *URINARY PROBLEMS  *BOWEL PROBLEMS  UNUSUAL RASH Items with * indicate a potential emergency and should be followed up as soon as possible.  Feel free to call the clinic should you have any questions or concerns at The clinic phone number is [phone].  Please show the [facility] at check-in to the Emergency Department and triage nurse.  Azacitidine suspension for injection (subcutaneous use) What is this medicine? AZACITIDINE (ay [place]) is a chemotherapy drug. This medicine reduces the growth of cancer cells and can suppress the immune system. It is used for treating myelodysplastic syndrome or some types of leukemia. This medicine may be used for other purposes; ask your health care provider or pharmacist if you have questions. COMMON BRAND NAME(S): Vidaza What should I tell my health care provider before I take this medicine? They need to know if you have any of these conditions:  kidney disease  liver disease  liver tumors  an unusual or allergic reaction to azacitidine, mannitol, other medicines, foods, dyes, or preservatives  pregnant or trying to get pregnant  breast-feeding How should I use this medicine? This medicine is for injection under  the skin. It is administered in a hospital or clinic by a specially trained health care professional. Talk to your pediatrician regarding the use of this medicine in children. While this drug may be prescribed for selected conditions, precautions do apply. Overdosage: If you think you have taken too much of this medicine contact a poison control center or emergency room at once. NOTE: This medicine is only for you. Do not share this medicine with others. What if I miss a dose? It is important not to miss your dose. Call your doctor or health care professional if you are unable to keep an appointment. What may interact with this medicine? Interactions have not been studied. Give your health care provider a list of all the medicines, herbs, non-prescription drugs, or dietary supplements you use. Also tell them if you smoke, drink alcohol, or use illegal drugs. Some items may interact with your medicine. This list may not describe all possible interactions. Give your health care provider a list of all the medicines, herbs, non-prescription drugs, or dietary supplements you use. Also tell them if you smoke, drink alcohol, or use illegal drugs. Some items may interact with your medicine. What should I watch for while using this medicine? Visit your doctor for checks on your progress. This drug may make you feel generally unwell. This is not uncommon, as chemotherapy can affect healthy cells as well as cancer cells. Report any side effects. Continue your course of treatment even though you feel ill unless your doctor tells you to stop. In some cases, you may be given additional medicines to help with side effects. Follow all directions for their  use. Call your doctor or health care professional for advice if you get a fever, chills or sore throat, or other symptoms of a cold or flu. Do not treat yourself. This drug decreases your body's ability to fight infections. Try to avoid being around people who are  sick. This medicine may increase your risk to bruise or bleed. Call your doctor or health care professional if you notice any unusual bleeding. You may need blood work done while you are taking this medicine. Do not become pregnant while taking this medicine and for 6 months after the last dose. Women should inform their doctor if they wish to become pregnant or think they might be pregnant. Men should not father a child while taking this medicine and for 3 months after the last dose. There is a potential for serious side effects to an unborn child. Talk to your health care professional or pharmacist for more information. Do not breast-feed an infant while taking this medicine and for 1 week after the last dose. This medicine may interfere with the ability to have a child. Talk with your doctor or health care professional if you are concerned about your fertility. What side effects may I notice from receiving this medicine? Side effects that you should report to your doctor or health care professional as soon as possible:  allergic reactions like skin rash, itching or hives, swelling of the face, lips, or tongue  low blood counts - this medicine may decrease the number of white blood cells, red blood cells and platelets. You may be at increased risk for infections and bleeding.  signs of infection - fever or chills, cough, sore throat, pain passing urine  signs of decreased platelets or bleeding - bruising, pinpoint red spots on the skin, black, tarry stools, blood in the urine  signs of decreased red blood cells - unusually weak or tired, fainting spells, lightheadedness  signs and symptoms of kidney injury like trouble passing urine or change in the amount of urine  signs and symptoms of liver injury like dark yellow or brown urine; general ill feeling or flu-like symptoms; light-colored stools; loss of appetite; nausea; right upper belly pain; unusually weak or tired; yellowing of the eyes or  skin Side effects that usually do not require medical attention (report to your doctor or health care professional if they continue or are bothersome):  constipation  diarrhea  nausea, vomiting  pain or redness at the injection site  unusually weak or tired This list may not describe all possible side effects. Call your doctor for medical advice about side effects. You may report side effects to FDA at 1-800-FDA-1088. Where should I keep my medicine? This drug is given in a hospital or clinic and will not be stored at home. NOTE: This sheet is a summary. It may not cover all possible information. If you have questions about this medicine, talk to your doctor, pharmacist, or health care provider.  2021 [employer]vier/Gold Standard (2016-02-01 14:37:51)

## 2020-03-24 NOTE — Progress Notes (Signed)
pts bp is 177/72, notified melissa, NP and she stated that is fine since she is going to go see her primary care doctor at 12 today.

## 2020-03-25 ENCOUNTER — Inpatient Hospital Stay: Payer: Medicare HMO

## 2020-03-25 VITALS — BP 141/52 | HR 77 | Temp 97.7°F | Resp 18 | Ht 61.0 in | Wt 195.1 lb

## 2020-03-25 DIAGNOSIS — Z5111 Encounter for antineoplastic chemotherapy: Secondary | ICD-10-CM | POA: Diagnosis not present

## 2020-03-25 DIAGNOSIS — D46Z Other myelodysplastic syndromes: Secondary | ICD-10-CM

## 2020-03-25 DIAGNOSIS — D4621 Refractory anemia with excess of blasts 1: Secondary | ICD-10-CM | POA: Diagnosis not present

## 2020-03-25 DIAGNOSIS — Z95828 Presence of other vascular implants and grafts: Secondary | ICD-10-CM

## 2020-03-25 MED ORDER — SODIUM CHLORIDE 0.9% FLUSH
10.0000 mL | INTRAVENOUS | Status: DC | PRN
  Administered 2020-03-25: 10 mL
  Filled 2020-03-25: qty 10

## 2020-03-25 MED ORDER — ONDANSETRON HCL 8 MG PO TABS: 8.0000 mg | ORAL_TABLET | Freq: Once | ORAL | Status: DC

## 2020-03-25 MED ORDER — AZACITIDINE CHEMO SQ INJECTION
75.0000 mg/m2 | Freq: Once | INTRAMUSCULAR | Status: AC
  Administered 2020-03-25: 152.5 mg via SUBCUTANEOUS
  Filled 2020-03-25: qty 6.1

## 2020-03-25 MED ORDER — HEPARIN SOD (PORK) LOCK FLUSH 100 UNIT/ML IV SOLN
500.0000 [IU] | Freq: Once | INTRAVENOUS | Status: AC | PRN
  Administered 2020-03-25: 500 [IU]
  Filled 2020-03-25: qty 5

## 2020-03-25 NOTE — Patient Instructions (Signed)
[facility] Discharge Instructions for Patients Receiving Chemotherapy  Today you received the following chemotherapy agents Vidaza  To help prevent nausea and vomiting after your treatment, we encourage you to take your nausea medication as directedAzacitidine suspension for injection (subcutaneous use) What is this medicine? AZACITIDINE (ay [place]) is a chemotherapy drug. This medicine reduces the growth of cancer cells and can suppress the immune system. It is used for treating myelodysplastic syndrome or some types of leukemia. This medicine may be used for other purposes; ask your health care provider or pharmacist if you have questions. COMMON BRAND NAME(S): Vidaza What should I tell my health care provider before I take this medicine? They need to know if you have any of these conditions:  kidney disease  liver disease  liver tumors  an unusual or allergic reaction to azacitidine, mannitol, other medicines, foods, dyes, or preservatives  pregnant or trying to get pregnant  breast-feeding How should I use this medicine? This medicine is for injection under the skin. It is administered in a hospital or clinic by a specially trained health care professional. Talk to your pediatrician regarding the use of this medicine in children. While this drug may be prescribed for selected conditions, precautions do apply. Overdosage: If you think you have taken too much of this medicine contact a poison control center or emergency room at once. NOTE: This medicine is only for you. Do not share this medicine with others. What if I miss a dose? It is important not to miss your dose. Call your doctor or health care professional if you are unable to keep an appointment. What may interact with this medicine? Interactions have not been studied. Give your health care provider a list of all the medicines, herbs, non-prescription drugs, or dietary supplements you use.  Also tell them if you smoke, drink alcohol, or use illegal drugs. Some items may interact with your medicine. This list may not describe all possible interactions. Give your health care provider a list of all the medicines, herbs, non-prescription drugs, or dietary supplements you use. Also tell them if you smoke, drink alcohol, or use illegal drugs. Some items may interact with your medicine. What should I watch for while using this medicine? Visit your doctor for checks on your progress. This drug may make you feel generally unwell. This is not uncommon, as chemotherapy can affect healthy cells as well as cancer cells. Report any side effects. Continue your course of treatment even though you feel ill unless your doctor tells you to stop. In some cases, you may be given additional medicines to help with side effects. Follow all directions for their use. Call your doctor or health care professional for advice if you get a fever, chills or sore throat, or other symptoms of a cold or flu. Do not treat yourself. This drug decreases your body's ability to fight infections. Try to avoid being around people who are sick. This medicine may increase your risk to bruise or bleed. Call your doctor or health care professional if you notice any unusual bleeding. You may need blood work done while you are taking this medicine. Do not become pregnant while taking this medicine and for 6 months after the last dose. Women should inform their doctor if they wish to become pregnant or think they might be pregnant. Men should not father a child while taking this medicine and for 3 months after the last dose. There is a potential for  serious side effects to an unborn child. Talk to your health care professional or pharmacist for more information. Do not breast-feed an infant while taking this medicine and for 1 week after the last dose. This medicine may interfere with the ability to have a child. Talk with your doctor or  health care professional if you are concerned about your fertility. What side effects may I notice from receiving this medicine? Side effects that you should report to your doctor or health care professional as soon as possible:  allergic reactions like skin rash, itching or hives, swelling of the face, lips, or tongue  low blood counts - this medicine may decrease the number of white blood cells, red blood cells and platelets. You may be at increased risk for infections and bleeding.  signs of infection - fever or chills, cough, sore throat, pain passing urine  signs of decreased platelets or bleeding - bruising, pinpoint red spots on the skin, black, tarry stools, blood in the urine  signs of decreased red blood cells - unusually weak or tired, fainting spells, lightheadedness  signs and symptoms of kidney injury like trouble passing urine or change in the amount of urine  signs and symptoms of liver injury like dark yellow or brown urine; general ill feeling or flu-like symptoms; light-colored stools; loss of appetite; nausea; right upper belly pain; unusually weak or tired; yellowing of the eyes or skin Side effects that usually do not require medical attention (report to your doctor or health care professional if they continue or are bothersome):  constipation  diarrhea  nausea, vomiting  pain or redness at the injection site  unusually weak or tired This list may not describe all possible side effects. Call your doctor for medical advice about side effects. You may report side effects to FDA at 1-800-FDA-1088. Where should I keep my medicine? This drug is given in a hospital or clinic and will not be stored at home. NOTE: This sheet is a summary. It may not cover all possible information. If you have questions about this medicine, talk to your doctor, pharmacist, or health care provider.  2021 Elsevier/Gold Standard (2016-02-01 14:37:51)    If you develop nausea and vomiting  that is not controlled by your nausea medication, call the clinic.   BELOW ARE SYMPTOMS THAT SHOULD BE REPORTED IMMEDIATELY:  *FEVER GREATER THAN 100.5 F  *CHILLS WITH OR WITHOUT FEVER  NAUSEA AND VOMITING THAT IS NOT CONTROLLED WITH YOUR NAUSEA MEDICATION  *UNUSUAL SHORTNESS OF BREATH  *UNUSUAL BRUISING OR BLEEDING  TENDERNESS IN MOUTH AND THROAT WITH OR WITHOUT PRESENCE OF ULCERS  *URINARY PROBLEMS  *BOWEL PROBLEMS  UNUSUAL RASH Items with * indicate a potential emergency and should be followed up as soon as possible.  Feel free to call the clinic should you have any questions or concerns at The clinic phone number is [phone].  Please show the [facility] at check-in to the Emergency Department and triage nurse.

## 2020-03-25 NOTE — Progress Notes (Signed)
Spoke with susan pharmacist about her port not being flushed since December and she put in orders for a port flush.

## 2020-03-25 NOTE — Progress Notes (Signed)
Sent in request for DOS 04/19/2020-04/27/2020, to [employer].

## 2020-03-25 NOTE — Progress Notes (Signed)
Pt d/c at 1201 stable

## 2020-03-26 ENCOUNTER — Inpatient Hospital Stay: Payer: Medicare HMO

## 2020-03-26 ENCOUNTER — Other Ambulatory Visit: Payer: Self-pay

## 2020-03-26 VITALS — BP 143/70 | HR 73 | Temp 98.4°F | Resp 18 | Ht 61.0 in | Wt 196.1 lb

## 2020-03-26 DIAGNOSIS — D46Z Other myelodysplastic syndromes: Secondary | ICD-10-CM

## 2020-03-26 DIAGNOSIS — I4891 Unspecified atrial fibrillation: Secondary | ICD-10-CM | POA: Diagnosis not present

## 2020-03-26 DIAGNOSIS — E1151 Type 2 diabetes mellitus with diabetic peripheral angiopathy without gangrene: Secondary | ICD-10-CM | POA: Diagnosis not present

## 2020-03-26 DIAGNOSIS — Z7984 Long term (current) use of oral hypoglycemic drugs: Secondary | ICD-10-CM | POA: Diagnosis not present

## 2020-03-26 DIAGNOSIS — Z5111 Encounter for antineoplastic chemotherapy: Secondary | ICD-10-CM | POA: Diagnosis not present

## 2020-03-26 DIAGNOSIS — I509 Heart failure, unspecified: Secondary | ICD-10-CM | POA: Diagnosis not present

## 2020-03-26 DIAGNOSIS — D4621 Refractory anemia with excess of blasts 1: Secondary | ICD-10-CM | POA: Diagnosis not present

## 2020-03-26 DIAGNOSIS — Z7901 Long term (current) use of anticoagulants: Secondary | ICD-10-CM | POA: Diagnosis not present

## 2020-03-26 DIAGNOSIS — L89153 Pressure ulcer of sacral region, stage 3: Secondary | ICD-10-CM | POA: Diagnosis not present

## 2020-03-26 DIAGNOSIS — I110 Hypertensive heart disease with heart failure: Secondary | ICD-10-CM | POA: Diagnosis not present

## 2020-03-26 DIAGNOSIS — I872 Venous insufficiency (chronic) (peripheral): Secondary | ICD-10-CM | POA: Diagnosis not present

## 2020-03-26 DIAGNOSIS — M6281 Muscle weakness (generalized): Secondary | ICD-10-CM | POA: Diagnosis not present

## 2020-03-26 MED ORDER — AZACITIDINE CHEMO SQ INJECTION
75.0000 mg/m2 | Freq: Once | INTRAMUSCULAR | Status: AC
  Administered 2020-03-26: 152.5 mg via SUBCUTANEOUS
  Filled 2020-03-26: qty 6.1

## 2020-03-26 MED ORDER — ONDANSETRON HCL 8 MG PO TABS: 8.0000 mg | ORAL_TABLET | Freq: Once | ORAL | Status: DC

## 2020-03-26 NOTE — Patient Instructions (Signed)
[facility] Discharge Instructions for Patients Receiving Chemotherapy  Today you received the following chemotherapy agents azacitidine  To help prevent nausea and vomiting after your treatment, we encourage you to take your nausea medication as directed   If you develop nausea and vomiting that is not controlled by your nausea medication, call the clinic.   BELOW ARE SYMPTOMS THAT SHOULD BE REPORTED IMMEDIATELY:  *FEVER GREATER THAN 100.5 F  *CHILLS WITH OR WITHOUT FEVER  NAUSEA AND VOMITING THAT IS NOT CONTROLLED WITH YOUR NAUSEA MEDICATION  *UNUSUAL SHORTNESS OF BREATH  *UNUSUAL BRUISING OR BLEEDING  TENDERNESS IN MOUTH AND THROAT WITH OR WITHOUT PRESENCE OF ULCERS  *URINARY PROBLEMS  *BOWEL PROBLEMS  UNUSUAL RASH Items with * indicate a potential emergency and should be followed up as soon as possible.  Feel free to call the clinic should you have any questions or concerns at The clinic phone number is [phone].  Please show the [facility] at check-in to the Emergency Department and triage nurse.  Azacitidine suspension for injection (subcutaneous use) What is this medicine? AZACITIDINE (ay [place]) is a chemotherapy drug. This medicine reduces the growth of cancer cells and can suppress the immune system. It is used for treating myelodysplastic syndrome or some types of leukemia. This medicine may be used for other purposes; ask your health care provider or pharmacist if you have questions. COMMON BRAND NAME(S): Vidaza What should I tell my health care provider before I take this medicine? They need to know if you have any of these conditions:  kidney disease  liver disease  liver tumors  an unusual or allergic reaction to azacitidine, mannitol, other medicines, foods, dyes, or preservatives  pregnant or trying to get pregnant  breast-feeding How should I use this medicine? This medicine is for injection under the  skin. It is administered in a hospital or clinic by a specially trained health care professional. Talk to your pediatrician regarding the use of this medicine in children. While this drug may be prescribed for selected conditions, precautions do apply. Overdosage: If you think you have taken too much of this medicine contact a poison control center or emergency room at once. NOTE: This medicine is only for you. Do not share this medicine with others. What if I miss a dose? It is important not to miss your dose. Call your doctor or health care professional if you are unable to keep an appointment. What may interact with this medicine? Interactions have not been studied. Give your health care provider a list of all the medicines, herbs, non-prescription drugs, or dietary supplements you use. Also tell them if you smoke, drink alcohol, or use illegal drugs. Some items may interact with your medicine. This list may not describe all possible interactions. Give your health care provider a list of all the medicines, herbs, non-prescription drugs, or dietary supplements you use. Also tell them if you smoke, drink alcohol, or use illegal drugs. Some items may interact with your medicine. What should I watch for while using this medicine? Visit your doctor for checks on your progress. This drug may make you feel generally unwell. This is not uncommon, as chemotherapy can affect healthy cells as well as cancer cells. Report any side effects. Continue your course of treatment even though you feel ill unless your doctor tells you to stop. In some cases, you may be given additional medicines to help with side effects. Follow all directions for their  use. Call your doctor or health care professional for advice if you get a fever, chills or sore throat, or other symptoms of a cold or flu. Do not treat yourself. This drug decreases your body's ability to fight infections. Try to avoid being around people who are  sick. This medicine may increase your risk to bruise or bleed. Call your doctor or health care professional if you notice any unusual bleeding. You may need blood work done while you are taking this medicine. Do not become pregnant while taking this medicine and for 6 months after the last dose. Women should inform their doctor if they wish to become pregnant or think they might be pregnant. Men should not father a child while taking this medicine and for 3 months after the last dose. There is a potential for serious side effects to an unborn child. Talk to your health care professional or pharmacist for more information. Do not breast-feed an infant while taking this medicine and for 1 week after the last dose. This medicine may interfere with the ability to have a child. Talk with your doctor or health care professional if you are concerned about your fertility. What side effects may I notice from receiving this medicine? Side effects that you should report to your doctor or health care professional as soon as possible:  allergic reactions like skin rash, itching or hives, swelling of the face, lips, or tongue  low blood counts - this medicine may decrease the number of white blood cells, red blood cells and platelets. You may be at increased risk for infections and bleeding.  signs of infection - fever or chills, cough, sore throat, pain passing urine  signs of decreased platelets or bleeding - bruising, pinpoint red spots on the skin, black, tarry stools, blood in the urine  signs of decreased red blood cells - unusually weak or tired, fainting spells, lightheadedness  signs and symptoms of kidney injury like trouble passing urine or change in the amount of urine  signs and symptoms of liver injury like dark yellow or brown urine; general ill feeling or flu-like symptoms; light-colored stools; loss of appetite; nausea; right upper belly pain; unusually weak or tired; yellowing of the eyes or  skin Side effects that usually do not require medical attention (report to your doctor or health care professional if they continue or are bothersome):  constipation  diarrhea  nausea, vomiting  pain or redness at the injection site  unusually weak or tired This list may not describe all possible side effects. Call your doctor for medical advice about side effects. You may report side effects to FDA at 1-800-FDA-1088. Where should I keep my medicine? This drug is given in a hospital or clinic and will not be stored at home. NOTE: This sheet is a summary. It may not cover all possible information. If you have questions about this medicine, talk to your doctor, pharmacist, or health care provider.  2021 Elsevier/Gold Standard (2016-02-01 14:37:51)

## 2020-03-26 NOTE — Progress Notes (Signed)
Pt d/c at 1149 stable

## 2020-03-29 DIAGNOSIS — I8310 Varicose veins of unspecified lower extremity with inflammation: Secondary | ICD-10-CM | POA: Diagnosis not present

## 2020-03-29 DIAGNOSIS — I509 Heart failure, unspecified: Secondary | ICD-10-CM | POA: Diagnosis not present

## 2020-03-29 DIAGNOSIS — I872 Venous insufficiency (chronic) (peripheral): Secondary | ICD-10-CM | POA: Diagnosis not present

## 2020-03-29 DIAGNOSIS — S81802S Unspecified open wound, left lower leg, sequela: Secondary | ICD-10-CM | POA: Diagnosis not present

## 2020-03-29 DIAGNOSIS — Z7984 Long term (current) use of oral hypoglycemic drugs: Secondary | ICD-10-CM | POA: Diagnosis not present

## 2020-03-29 DIAGNOSIS — I110 Hypertensive heart disease with heart failure: Secondary | ICD-10-CM | POA: Diagnosis not present

## 2020-03-29 DIAGNOSIS — I4891 Unspecified atrial fibrillation: Secondary | ICD-10-CM | POA: Diagnosis not present

## 2020-03-29 DIAGNOSIS — E1151 Type 2 diabetes mellitus with diabetic peripheral angiopathy without gangrene: Secondary | ICD-10-CM | POA: Diagnosis not present

## 2020-03-29 DIAGNOSIS — Z7901 Long term (current) use of anticoagulants: Secondary | ICD-10-CM | POA: Diagnosis not present

## 2020-03-29 DIAGNOSIS — L89153 Pressure ulcer of sacral region, stage 3: Secondary | ICD-10-CM | POA: Diagnosis not present

## 2020-03-29 DIAGNOSIS — M6281 Muscle weakness (generalized): Secondary | ICD-10-CM | POA: Diagnosis not present

## 2020-03-29 DIAGNOSIS — I10 Essential (primary) hypertension: Secondary | ICD-10-CM | POA: Diagnosis not present

## 2020-03-31 DIAGNOSIS — I110 Hypertensive heart disease with heart failure: Secondary | ICD-10-CM | POA: Diagnosis not present

## 2020-03-31 DIAGNOSIS — Z7901 Long term (current) use of anticoagulants: Secondary | ICD-10-CM | POA: Diagnosis not present

## 2020-03-31 DIAGNOSIS — I872 Venous insufficiency (chronic) (peripheral): Secondary | ICD-10-CM | POA: Diagnosis not present

## 2020-03-31 DIAGNOSIS — M6281 Muscle weakness (generalized): Secondary | ICD-10-CM | POA: Diagnosis not present

## 2020-03-31 DIAGNOSIS — I509 Heart failure, unspecified: Secondary | ICD-10-CM | POA: Diagnosis not present

## 2020-03-31 DIAGNOSIS — I4891 Unspecified atrial fibrillation: Secondary | ICD-10-CM | POA: Diagnosis not present

## 2020-03-31 DIAGNOSIS — Z7984 Long term (current) use of oral hypoglycemic drugs: Secondary | ICD-10-CM | POA: Diagnosis not present

## 2020-03-31 DIAGNOSIS — E1151 Type 2 diabetes mellitus with diabetic peripheral angiopathy without gangrene: Secondary | ICD-10-CM | POA: Diagnosis not present

## 2020-03-31 DIAGNOSIS — L89153 Pressure ulcer of sacral region, stage 3: Secondary | ICD-10-CM | POA: Diagnosis not present

## 2020-04-02 DIAGNOSIS — L89153 Pressure ulcer of sacral region, stage 3: Secondary | ICD-10-CM | POA: Diagnosis not present

## 2020-04-02 DIAGNOSIS — I4891 Unspecified atrial fibrillation: Secondary | ICD-10-CM | POA: Diagnosis not present

## 2020-04-02 DIAGNOSIS — M6281 Muscle weakness (generalized): Secondary | ICD-10-CM | POA: Diagnosis not present

## 2020-04-02 DIAGNOSIS — E1151 Type 2 diabetes mellitus with diabetic peripheral angiopathy without gangrene: Secondary | ICD-10-CM | POA: Diagnosis not present

## 2020-04-02 DIAGNOSIS — I110 Hypertensive heart disease with heart failure: Secondary | ICD-10-CM | POA: Diagnosis not present

## 2020-04-02 DIAGNOSIS — I509 Heart failure, unspecified: Secondary | ICD-10-CM | POA: Diagnosis not present

## 2020-04-02 DIAGNOSIS — Z7984 Long term (current) use of oral hypoglycemic drugs: Secondary | ICD-10-CM | POA: Diagnosis not present

## 2020-04-02 DIAGNOSIS — Z7901 Long term (current) use of anticoagulants: Secondary | ICD-10-CM | POA: Diagnosis not present

## 2020-04-02 DIAGNOSIS — I872 Venous insufficiency (chronic) (peripheral): Secondary | ICD-10-CM | POA: Diagnosis not present

## 2020-04-05 DIAGNOSIS — Z7901 Long term (current) use of anticoagulants: Secondary | ICD-10-CM | POA: Diagnosis not present

## 2020-04-05 DIAGNOSIS — L89153 Pressure ulcer of sacral region, stage 3: Secondary | ICD-10-CM | POA: Diagnosis not present

## 2020-04-05 DIAGNOSIS — I110 Hypertensive heart disease with heart failure: Secondary | ICD-10-CM | POA: Diagnosis not present

## 2020-04-05 DIAGNOSIS — Z7984 Long term (current) use of oral hypoglycemic drugs: Secondary | ICD-10-CM | POA: Diagnosis not present

## 2020-04-05 DIAGNOSIS — E1151 Type 2 diabetes mellitus with diabetic peripheral angiopathy without gangrene: Secondary | ICD-10-CM | POA: Diagnosis not present

## 2020-04-05 DIAGNOSIS — I4891 Unspecified atrial fibrillation: Secondary | ICD-10-CM | POA: Diagnosis not present

## 2020-04-05 DIAGNOSIS — I509 Heart failure, unspecified: Secondary | ICD-10-CM | POA: Diagnosis not present

## 2020-04-05 DIAGNOSIS — I872 Venous insufficiency (chronic) (peripheral): Secondary | ICD-10-CM | POA: Diagnosis not present

## 2020-04-05 DIAGNOSIS — M6281 Muscle weakness (generalized): Secondary | ICD-10-CM | POA: Diagnosis not present

## 2020-04-07 DIAGNOSIS — I4891 Unspecified atrial fibrillation: Secondary | ICD-10-CM | POA: Diagnosis not present

## 2020-04-07 DIAGNOSIS — E1151 Type 2 diabetes mellitus with diabetic peripheral angiopathy without gangrene: Secondary | ICD-10-CM | POA: Diagnosis not present

## 2020-04-07 DIAGNOSIS — I509 Heart failure, unspecified: Secondary | ICD-10-CM | POA: Diagnosis not present

## 2020-04-07 DIAGNOSIS — I872 Venous insufficiency (chronic) (peripheral): Secondary | ICD-10-CM | POA: Diagnosis not present

## 2020-04-07 DIAGNOSIS — Z7901 Long term (current) use of anticoagulants: Secondary | ICD-10-CM | POA: Diagnosis not present

## 2020-04-07 DIAGNOSIS — M6281 Muscle weakness (generalized): Secondary | ICD-10-CM | POA: Diagnosis not present

## 2020-04-07 DIAGNOSIS — I110 Hypertensive heart disease with heart failure: Secondary | ICD-10-CM | POA: Diagnosis not present

## 2020-04-07 DIAGNOSIS — L89153 Pressure ulcer of sacral region, stage 3: Secondary | ICD-10-CM | POA: Diagnosis not present

## 2020-04-07 DIAGNOSIS — Z7984 Long term (current) use of oral hypoglycemic drugs: Secondary | ICD-10-CM | POA: Diagnosis not present

## 2020-04-09 DIAGNOSIS — Z7984 Long term (current) use of oral hypoglycemic drugs: Secondary | ICD-10-CM | POA: Diagnosis not present

## 2020-04-09 DIAGNOSIS — M6281 Muscle weakness (generalized): Secondary | ICD-10-CM | POA: Diagnosis not present

## 2020-04-09 DIAGNOSIS — E1151 Type 2 diabetes mellitus with diabetic peripheral angiopathy without gangrene: Secondary | ICD-10-CM | POA: Diagnosis not present

## 2020-04-09 DIAGNOSIS — I4891 Unspecified atrial fibrillation: Secondary | ICD-10-CM | POA: Diagnosis not present

## 2020-04-09 DIAGNOSIS — I872 Venous insufficiency (chronic) (peripheral): Secondary | ICD-10-CM | POA: Diagnosis not present

## 2020-04-09 DIAGNOSIS — L89153 Pressure ulcer of sacral region, stage 3: Secondary | ICD-10-CM | POA: Diagnosis not present

## 2020-04-09 DIAGNOSIS — I509 Heart failure, unspecified: Secondary | ICD-10-CM | POA: Diagnosis not present

## 2020-04-09 DIAGNOSIS — Z7901 Long term (current) use of anticoagulants: Secondary | ICD-10-CM | POA: Diagnosis not present

## 2020-04-09 DIAGNOSIS — I110 Hypertensive heart disease with heart failure: Secondary | ICD-10-CM | POA: Diagnosis not present

## 2020-04-12 DIAGNOSIS — I509 Heart failure, unspecified: Secondary | ICD-10-CM | POA: Diagnosis not present

## 2020-04-12 DIAGNOSIS — Z7984 Long term (current) use of oral hypoglycemic drugs: Secondary | ICD-10-CM | POA: Diagnosis not present

## 2020-04-12 DIAGNOSIS — I4891 Unspecified atrial fibrillation: Secondary | ICD-10-CM | POA: Diagnosis not present

## 2020-04-12 DIAGNOSIS — Z7901 Long term (current) use of anticoagulants: Secondary | ICD-10-CM | POA: Diagnosis not present

## 2020-04-12 DIAGNOSIS — E1151 Type 2 diabetes mellitus with diabetic peripheral angiopathy without gangrene: Secondary | ICD-10-CM | POA: Diagnosis not present

## 2020-04-12 DIAGNOSIS — I110 Hypertensive heart disease with heart failure: Secondary | ICD-10-CM | POA: Diagnosis not present

## 2020-04-12 DIAGNOSIS — I872 Venous insufficiency (chronic) (peripheral): Secondary | ICD-10-CM | POA: Diagnosis not present

## 2020-04-12 DIAGNOSIS — M6281 Muscle weakness (generalized): Secondary | ICD-10-CM | POA: Diagnosis not present

## 2020-04-12 DIAGNOSIS — L89153 Pressure ulcer of sacral region, stage 3: Secondary | ICD-10-CM | POA: Diagnosis not present

## 2020-04-13 ENCOUNTER — Other Ambulatory Visit: Payer: Self-pay | Admitting: Oncology

## 2020-04-13 DIAGNOSIS — D46Z Other myelodysplastic syndromes: Secondary | ICD-10-CM

## 2020-04-13 NOTE — Progress Notes (Signed)
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  Her bone marrow biopsy revealed 5-6% blasts present.  She comes in today to be evaluated before heading into her 6th cycle of azacitidine.  The patient claims to have tolerated her 5th cycle of treatment fairly well.  With respect to her myelodysplasia, the patient has felt better over the past few weeks.  Her previous skin lesions are now healing.  She denies having increased fatigue or other symptoms which concern her for disease progression.    PHYSICAL EXAM:  Blood pressure (!) 211/92, pulse 81, temperature 98.4 F (36.9 C), resp. rate 14, height _0  (1.549 m), weight 208 lb 14.4 oz (94.8 kg), SpO2 99 %. Wt Readings from Last 3 Encounters:  04/21/20 207 lb (93.9 kg)  04/20/20 206 lb 4 oz (93.6 kg)  04/19/20 206 lb 9 oz (93.7 kg)   Body mass index is 39.47 kg/m. Performance status (ECOG): 1 Physical Exam Constitutional:      Appearance: Normal appearance. She is not ill-appearing.  HENT:     Mouth/Throat:     Mouth: Mucous membranes are moist.     Pharynx: Oropharynx is clear. No oropharyngeal exudate or posterior oropharyngeal erythema.  Cardiovascular:     Rate and Rhythm: Normal rate and regular rhythm.     Heart sounds: No murmur heard. No friction rub. No gallop.   Pulmonary:     Effort: Pulmonary effort is normal. No respiratory distress.     Breath sounds: Normal breath sounds. No wheezing, rhonchi or rales.  Chest:  Breasts:     Right: No axillary adenopathy or supraclavicular adenopathy.     Left: No axillary adenopathy or supraclavicular adenopathy.    Abdominal:     General: Bowel sounds are normal. There is no distension.     Palpations: Abdomen is soft. There is no mass.      Tenderness: There is no abdominal tenderness.  Musculoskeletal:        General: No swelling.     Right lower leg: No edema.     Left lower leg: No edema.  Lymphadenopathy:     Cervical: No cervical adenopathy.     Upper Body:     Right upper body: No supraclavicular or axillary adenopathy.     Left upper body: No supraclavicular or axillary adenopathy.     Lower Body: No right inguinal adenopathy. No left inguinal adenopathy.  Skin:    General: Skin is warm.     Coloration: Skin is not jaundiced.     Findings: Lesion (multiple violaceous lesions on her left thigh and trunk are much improved) present. No rash.  Neurological:     General: No focal deficit present.     Mental Status: She is alert and oriented to person, place, and time. Mental status is at baseline.     Cranial Nerves: Cranial nerves are intact.  Psychiatric:        Mood and Affect: Mood normal.        Behavior: Behavior normal.        Thought Content: Thought content normal.     LABS:   CBC Latest Ref Rng & Units 04/14/2020 03/15/2020 02/16/2020  WBC - 1.8 1.6 1.2  Hemoglobin 12.0 - 16.0 10.4(A) 10.8(A) 10.2(A)  Hematocrit 36 - 46 33(A) 33(A) 32(A)  Platelets 150 - 399 212 645(A) 383   CMP Latest Ref Rng & Units 04/14/2020 03/15/2020 02/16/2020  Glucose 65 - 99 mg/dL - - -  BUN 4 - 21 16 43(A) 31(A)  Creatinine 0.5 - 1.1 0.8 1.1 1.0  Sodium 137 - 147 139 136(A) 137  Potassium 3.4 - 5.3 3.8 3.7 4.6  Chloride 99 - 108 103 100 105  CO2 13 - 22 28(A) 26(A) 24(A)  Calcium 8.7 - 10.7 8.6(A) 8.7 9.5  Alkaline Phos 25 - 125 - - -  AST 13 - 35 - - -  ALT 7 - 35 - - -    ASSESSMENT & PLAN:  Assessment/Plan:  A 79 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  She will proceed with her 6th cycle of azacitidine this week.  I am pleased as her hemoglobin remains over 10.  I am also pleased as her white count is better.  Clinically and emotionally, the patient appears to be doing much better.  I will see her back in 4  weeks before she heads into her 7th cycle of azacitidine.  The patient understands all the plans discussed today and is in agreement with them.    Ladonte Verstraete Macarthur Critchley, MD

## 2020-04-14 ENCOUNTER — Other Ambulatory Visit: Payer: Self-pay | Admitting: Hematology and Oncology

## 2020-04-14 ENCOUNTER — Other Ambulatory Visit: Payer: Self-pay

## 2020-04-14 ENCOUNTER — Inpatient Hospital Stay: Payer: Medicare HMO

## 2020-04-14 ENCOUNTER — Inpatient Hospital Stay: Payer: Medicare HMO | Admitting: Oncology

## 2020-04-14 ENCOUNTER — Other Ambulatory Visit: Payer: Self-pay | Admitting: Oncology

## 2020-04-14 ENCOUNTER — Telehealth: Payer: Self-pay | Admitting: Oncology

## 2020-04-14 VITALS — BP 211/92 | HR 81 | Temp 98.4°F | Resp 14 | Ht 61.0 in | Wt 208.9 lb

## 2020-04-14 DIAGNOSIS — Z7901 Long term (current) use of anticoagulants: Secondary | ICD-10-CM | POA: Diagnosis not present

## 2020-04-14 DIAGNOSIS — I110 Hypertensive heart disease with heart failure: Secondary | ICD-10-CM | POA: Diagnosis not present

## 2020-04-14 DIAGNOSIS — I872 Venous insufficiency (chronic) (peripheral): Secondary | ICD-10-CM | POA: Diagnosis not present

## 2020-04-14 DIAGNOSIS — I4891 Unspecified atrial fibrillation: Secondary | ICD-10-CM | POA: Diagnosis not present

## 2020-04-14 DIAGNOSIS — L89153 Pressure ulcer of sacral region, stage 3: Secondary | ICD-10-CM | POA: Diagnosis not present

## 2020-04-14 DIAGNOSIS — D46Z Other myelodysplastic syndromes: Secondary | ICD-10-CM

## 2020-04-14 DIAGNOSIS — M6281 Muscle weakness (generalized): Secondary | ICD-10-CM | POA: Diagnosis not present

## 2020-04-14 DIAGNOSIS — E1151 Type 2 diabetes mellitus with diabetic peripheral angiopathy without gangrene: Secondary | ICD-10-CM | POA: Diagnosis not present

## 2020-04-14 DIAGNOSIS — Z7984 Long term (current) use of oral hypoglycemic drugs: Secondary | ICD-10-CM | POA: Diagnosis not present

## 2020-04-14 DIAGNOSIS — I509 Heart failure, unspecified: Secondary | ICD-10-CM | POA: Diagnosis not present

## 2020-04-14 LAB — CBC AND DIFFERENTIAL
HCT: 33 — AB (ref 36–46)
Hemoglobin: 10.4 — AB (ref 12.0–16.0)
Neutrophils Absolute: 0.86
Platelets: 212 (ref 150–399)
WBC: 1.8

## 2020-04-14 LAB — COMPREHENSIVE METABOLIC PANEL: Calcium: 8.6 — AB (ref 8.7–10.7)

## 2020-04-14 LAB — BASIC METABOLIC PANEL
BUN: 16 (ref 4–21)
CO2: 28 — AB (ref 13–22)
Chloride: 103 (ref 99–108)
Creatinine: 0.8 (ref 0.5–1.1)
Glucose: 131
Potassium: 3.8 (ref 3.4–5.3)
Sodium: 139 (ref 137–147)

## 2020-04-14 LAB — CBC: RBC: 2.98 — AB (ref 3.87–5.11)

## 2020-04-14 NOTE — Telephone Encounter (Signed)
Per 3/30 los next appt scheduled and given to patient

## 2020-04-16 DIAGNOSIS — I872 Venous insufficiency (chronic) (peripheral): Secondary | ICD-10-CM | POA: Diagnosis not present

## 2020-04-16 DIAGNOSIS — I110 Hypertensive heart disease with heart failure: Secondary | ICD-10-CM | POA: Diagnosis not present

## 2020-04-16 DIAGNOSIS — F32A Depression, unspecified: Secondary | ICD-10-CM | POA: Diagnosis not present

## 2020-04-16 DIAGNOSIS — I4891 Unspecified atrial fibrillation: Secondary | ICD-10-CM | POA: Diagnosis not present

## 2020-04-16 DIAGNOSIS — L89153 Pressure ulcer of sacral region, stage 3: Secondary | ICD-10-CM | POA: Diagnosis not present

## 2020-04-16 DIAGNOSIS — I509 Heart failure, unspecified: Secondary | ICD-10-CM | POA: Diagnosis not present

## 2020-04-16 DIAGNOSIS — T8189XD Other complications of procedures, not elsewhere classified, subsequent encounter: Secondary | ICD-10-CM | POA: Diagnosis not present

## 2020-04-16 DIAGNOSIS — E039 Hypothyroidism, unspecified: Secondary | ICD-10-CM | POA: Diagnosis not present

## 2020-04-16 DIAGNOSIS — E1151 Type 2 diabetes mellitus with diabetic peripheral angiopathy without gangrene: Secondary | ICD-10-CM | POA: Diagnosis not present

## 2020-04-19 ENCOUNTER — Inpatient Hospital Stay: Payer: Medicare HMO | Attending: Oncology

## 2020-04-19 ENCOUNTER — Other Ambulatory Visit: Payer: Self-pay

## 2020-04-19 VITALS — BP 187/77 | HR 76 | Temp 98.2°F | Resp 16 | Ht 61.0 in | Wt 206.6 lb

## 2020-04-19 DIAGNOSIS — T8189XD Other complications of procedures, not elsewhere classified, subsequent encounter: Secondary | ICD-10-CM | POA: Diagnosis not present

## 2020-04-19 DIAGNOSIS — D46Z Other myelodysplastic syndromes: Secondary | ICD-10-CM

## 2020-04-19 DIAGNOSIS — Z79899 Other long term (current) drug therapy: Secondary | ICD-10-CM | POA: Insufficient documentation

## 2020-04-19 DIAGNOSIS — D4621 Refractory anemia with excess of blasts 1: Secondary | ICD-10-CM | POA: Insufficient documentation

## 2020-04-19 DIAGNOSIS — I4891 Unspecified atrial fibrillation: Secondary | ICD-10-CM | POA: Diagnosis not present

## 2020-04-19 DIAGNOSIS — Z5111 Encounter for antineoplastic chemotherapy: Secondary | ICD-10-CM | POA: Insufficient documentation

## 2020-04-19 DIAGNOSIS — I110 Hypertensive heart disease with heart failure: Secondary | ICD-10-CM | POA: Diagnosis not present

## 2020-04-19 DIAGNOSIS — F32A Depression, unspecified: Secondary | ICD-10-CM | POA: Diagnosis not present

## 2020-04-19 DIAGNOSIS — I509 Heart failure, unspecified: Secondary | ICD-10-CM | POA: Diagnosis not present

## 2020-04-19 DIAGNOSIS — L89153 Pressure ulcer of sacral region, stage 3: Secondary | ICD-10-CM | POA: Diagnosis not present

## 2020-04-19 DIAGNOSIS — E1151 Type 2 diabetes mellitus with diabetic peripheral angiopathy without gangrene: Secondary | ICD-10-CM | POA: Diagnosis not present

## 2020-04-19 DIAGNOSIS — E039 Hypothyroidism, unspecified: Secondary | ICD-10-CM | POA: Diagnosis not present

## 2020-04-19 DIAGNOSIS — I872 Venous insufficiency (chronic) (peripheral): Secondary | ICD-10-CM | POA: Diagnosis not present

## 2020-04-19 MED ORDER — AZACITIDINE CHEMO SQ INJECTION
75.0000 mg/m2 | Freq: Once | INTRAMUSCULAR | Status: AC
  Administered 2020-04-19: 152.5 mg via SUBCUTANEOUS
  Filled 2020-04-19: qty 6.1

## 2020-04-19 MED ORDER — ONDANSETRON HCL 8 MG PO TABS: 8.0000 mg | ORAL_TABLET | Freq: Once | ORAL | Status: DC

## 2020-04-19 NOTE — Patient Instructions (Signed)
Azacitidine suspension for injection (subcutaneous use) What is this medicine? AZACITIDINE (ay za SITE i deen) is a chemotherapy drug. This medicine reduces the growth of cancer cells and can suppress the immune system. It is used for treating myelodysplastic syndrome or some types of leukemia. This medicine may be used for other purposes; ask your health care provider or pharmacist if you have questions. COMMON BRAND NAME(S): Vidaza What should I tell my health care provider before I take this medicine? They need to know if you have any of these conditions:  kidney disease  liver disease  liver tumors  an unusual or allergic reaction to azacitidine, mannitol, other medicines, foods, dyes, or preservatives  pregnant or trying to get pregnant  breast-feeding How should I use this medicine? This medicine is for injection under the skin. It is administered in a hospital or clinic by a specially trained health care professional. Talk to your pediatrician regarding the use of this medicine in children. While this drug may be prescribed for selected conditions, precautions do apply. Overdosage: If you think you have taken too much of this medicine contact a poison control center or emergency room at once. NOTE: This medicine is only for you. Do not share this medicine with others. What if I miss a dose? It is important not to miss your dose. Call your doctor or health care professional if you are unable to keep an appointment. What may interact with this medicine? Interactions have not been studied. Give your health care provider a list of all the medicines, herbs, non-prescription drugs, or dietary supplements you use. Also tell them if you smoke, drink alcohol, or use illegal drugs. Some items may interact with your medicine. This list may not describe all possible interactions. Give your health care provider a list of all the medicines, herbs, non-prescription drugs, or dietary supplements  you use. Also tell them if you smoke, drink alcohol, or use illegal drugs. Some items may interact with your medicine. What should I watch for while using this medicine? Visit your doctor for checks on your progress. This drug may make you feel generally unwell. This is not uncommon, as chemotherapy can affect healthy cells as well as cancer cells. Report any side effects. Continue your course of treatment even though you feel ill unless your doctor tells you to stop. In some cases, you may be given additional medicines to help with side effects. Follow all directions for their use. Call your doctor or health care professional for advice if you get a fever, chills or sore throat, or other symptoms of a cold or flu. Do not treat yourself. This drug decreases your body's ability to fight infections. Try to avoid being around people who are sick. This medicine may increase your risk to bruise or bleed. Call your doctor or health care professional if you notice any unusual bleeding. You may need blood work done while you are taking this medicine. Do not become pregnant while taking this medicine and for 6 months after the last dose. Women should inform their doctor if they wish to become pregnant or think they might be pregnant. Men should not father a child while taking this medicine and for 3 months after the last dose. There is a potential for serious side effects to an unborn child. Talk to your health care professional or pharmacist for more information. Do not breast-feed an infant while taking this medicine and for 1 week after the last dose. This medicine may interfere with   the ability to have a child. Talk with your doctor or health care professional if you are concerned about your fertility. What side effects may I notice from receiving this medicine? Side effects that you should report to your doctor or health care professional as soon as possible:  allergic reactions like skin rash, itching or  hives, swelling of the face, lips, or tongue  low blood counts - this medicine may decrease the number of white blood cells, red blood cells and platelets. You may be at increased risk for infections and bleeding.  signs of infection - fever or chills, cough, sore throat, pain passing urine  signs of decreased platelets or bleeding - bruising, pinpoint red spots on the skin, black, tarry stools, blood in the urine  signs of decreased red blood cells - unusually weak or tired, fainting spells, lightheadedness  signs and symptoms of kidney injury like trouble passing urine or change in the amount of urine  signs and symptoms of liver injury like dark yellow or brown urine; general ill feeling or flu-like symptoms; light-colored stools; loss of appetite; nausea; right upper belly pain; unusually weak or tired; yellowing of the eyes or skin Side effects that usually do not require medical attention (report to your doctor or health care professional if they continue or are bothersome):  constipation  diarrhea  nausea, vomiting  pain or redness at the injection site  unusually weak or tired This list may not describe all possible side effects. Call your doctor for medical advice about side effects. You may report side effects to FDA at 1-800-FDA-1088. Where should I keep my medicine? This drug is given in a hospital or clinic and will not be stored at home. NOTE: This sheet is a summary. It may not cover all possible information. If you have questions about this medicine, talk to your doctor, pharmacist, or health care provider.  2021 Elsevier/Gold Standard (2016-02-01 14:37:51)  

## 2020-04-20 ENCOUNTER — Inpatient Hospital Stay: Payer: Medicare HMO

## 2020-04-20 VITALS — BP 177/87 | HR 77 | Temp 98.1°F | Resp 18 | Ht 61.0 in | Wt 206.2 lb

## 2020-04-20 DIAGNOSIS — D46Z Other myelodysplastic syndromes: Secondary | ICD-10-CM

## 2020-04-20 DIAGNOSIS — Z5111 Encounter for antineoplastic chemotherapy: Secondary | ICD-10-CM | POA: Diagnosis not present

## 2020-04-20 DIAGNOSIS — D4621 Refractory anemia with excess of blasts 1: Secondary | ICD-10-CM | POA: Diagnosis not present

## 2020-04-20 DIAGNOSIS — Z79899 Other long term (current) drug therapy: Secondary | ICD-10-CM | POA: Diagnosis not present

## 2020-04-20 MED ORDER — ONDANSETRON HCL 8 MG PO TABS: 8.0000 mg | ORAL_TABLET | Freq: Once | ORAL | Status: DC

## 2020-04-20 MED ORDER — AZACITIDINE CHEMO SQ INJECTION
75.0000 mg/m2 | Freq: Once | INTRAMUSCULAR | Status: AC
  Administered 2020-04-20: 152.5 mg via SUBCUTANEOUS
  Filled 2020-04-20: qty 6.1

## 2020-04-20 NOTE — Patient Instructions (Signed)
Azacitidine suspension for injection (subcutaneous use) What is this medicine? AZACITIDINE (ay za SITE i deen) is a chemotherapy drug. This medicine reduces the growth of cancer cells and can suppress the immune system. It is used for treating myelodysplastic syndrome or some types of leukemia. This medicine may be used for other purposes; ask your health care provider or pharmacist if you have questions. COMMON BRAND NAME(S): Vidaza What should I tell my health care provider before I take this medicine? They need to know if you have any of these conditions:  kidney disease  liver disease  liver tumors  an unusual or allergic reaction to azacitidine, mannitol, other medicines, foods, dyes, or preservatives  pregnant or trying to get pregnant  breast-feeding How should I use this medicine? This medicine is for injection under the skin. It is administered in a hospital or clinic by a specially trained health care professional. Talk to your pediatrician regarding the use of this medicine in children. While this drug may be prescribed for selected conditions, precautions do apply. Overdosage: If you think you have taken too much of this medicine contact a poison control center or emergency room at once. NOTE: This medicine is only for you. Do not share this medicine with others. What if I miss a dose? It is important not to miss your dose. Call your doctor or health care professional if you are unable to keep an appointment. What may interact with this medicine? Interactions have not been studied. Give your health care provider a list of all the medicines, herbs, non-prescription drugs, or dietary supplements you use. Also tell them if you smoke, drink alcohol, or use illegal drugs. Some items may interact with your medicine. This list may not describe all possible interactions. Give your health care provider a list of all the medicines, herbs, non-prescription drugs, or dietary supplements  you use. Also tell them if you smoke, drink alcohol, or use illegal drugs. Some items may interact with your medicine. What should I watch for while using this medicine? Visit your doctor for checks on your progress. This drug may make you feel generally unwell. This is not uncommon, as chemotherapy can affect healthy cells as well as cancer cells. Report any side effects. Continue your course of treatment even though you feel ill unless your doctor tells you to stop. In some cases, you may be given additional medicines to help with side effects. Follow all directions for their use. Call your doctor or health care professional for advice if you get a fever, chills or sore throat, or other symptoms of a cold or flu. Do not treat yourself. This drug decreases your body's ability to fight infections. Try to avoid being around people who are sick. This medicine may increase your risk to bruise or bleed. Call your doctor or health care professional if you notice any unusual bleeding. You may need blood work done while you are taking this medicine. Do not become pregnant while taking this medicine and for 6 months after the last dose. Women should inform their doctor if they wish to become pregnant or think they might be pregnant. Men should not father a child while taking this medicine and for 3 months after the last dose. There is a potential for serious side effects to an unborn child. Talk to your health care professional or pharmacist for more information. Do not breast-feed an infant while taking this medicine and for 1 week after the last dose. This medicine may interfere with   the ability to have a child. Talk with your doctor or health care professional if you are concerned about your fertility. What side effects may I notice from receiving this medicine? Side effects that you should report to your doctor or health care professional as soon as possible:  allergic reactions like skin rash, itching or  hives, swelling of the face, lips, or tongue  low blood counts - this medicine may decrease the number of white blood cells, red blood cells and platelets. You may be at increased risk for infections and bleeding.  signs of infection - fever or chills, cough, sore throat, pain passing urine  signs of decreased platelets or bleeding - bruising, pinpoint red spots on the skin, black, tarry stools, blood in the urine  signs of decreased red blood cells - unusually weak or tired, fainting spells, lightheadedness  signs and symptoms of kidney injury like trouble passing urine or change in the amount of urine  signs and symptoms of liver injury like dark yellow or brown urine; general ill feeling or flu-like symptoms; light-colored stools; loss of appetite; nausea; right upper belly pain; unusually weak or tired; yellowing of the eyes or skin Side effects that usually do not require medical attention (report to your doctor or health care professional if they continue or are bothersome):  constipation  diarrhea  nausea, vomiting  pain or redness at the injection site  unusually weak or tired This list may not describe all possible side effects. Call your doctor for medical advice about side effects. You may report side effects to FDA at 1-800-FDA-1088. Where should I keep my medicine? This drug is given in a hospital or clinic and will not be stored at home. NOTE: This sheet is a summary. It may not cover all possible information. If you have questions about this medicine, talk to your doctor, pharmacist, or health care provider.  2021 Elsevier/Gold Standard (2016-02-01 14:37:51)  

## 2020-04-21 ENCOUNTER — Inpatient Hospital Stay: Payer: Medicare HMO

## 2020-04-21 ENCOUNTER — Other Ambulatory Visit: Payer: Self-pay

## 2020-04-21 VITALS — BP 167/89 | HR 88 | Temp 98.2°F | Resp 18 | Ht 61.0 in | Wt 207.0 lb

## 2020-04-21 DIAGNOSIS — I4891 Unspecified atrial fibrillation: Secondary | ICD-10-CM | POA: Diagnosis not present

## 2020-04-21 DIAGNOSIS — T8189XD Other complications of procedures, not elsewhere classified, subsequent encounter: Secondary | ICD-10-CM | POA: Diagnosis not present

## 2020-04-21 DIAGNOSIS — D46Z Other myelodysplastic syndromes: Secondary | ICD-10-CM

## 2020-04-21 DIAGNOSIS — D4621 Refractory anemia with excess of blasts 1: Secondary | ICD-10-CM | POA: Diagnosis not present

## 2020-04-21 DIAGNOSIS — Z5111 Encounter for antineoplastic chemotherapy: Secondary | ICD-10-CM | POA: Diagnosis not present

## 2020-04-21 DIAGNOSIS — E1151 Type 2 diabetes mellitus with diabetic peripheral angiopathy without gangrene: Secondary | ICD-10-CM | POA: Diagnosis not present

## 2020-04-21 DIAGNOSIS — Z79899 Other long term (current) drug therapy: Secondary | ICD-10-CM | POA: Diagnosis not present

## 2020-04-21 DIAGNOSIS — I110 Hypertensive heart disease with heart failure: Secondary | ICD-10-CM | POA: Diagnosis not present

## 2020-04-21 DIAGNOSIS — L89153 Pressure ulcer of sacral region, stage 3: Secondary | ICD-10-CM | POA: Diagnosis not present

## 2020-04-21 DIAGNOSIS — E039 Hypothyroidism, unspecified: Secondary | ICD-10-CM | POA: Diagnosis not present

## 2020-04-21 DIAGNOSIS — I509 Heart failure, unspecified: Secondary | ICD-10-CM | POA: Diagnosis not present

## 2020-04-21 DIAGNOSIS — F32A Depression, unspecified: Secondary | ICD-10-CM | POA: Diagnosis not present

## 2020-04-21 DIAGNOSIS — I872 Venous insufficiency (chronic) (peripheral): Secondary | ICD-10-CM | POA: Diagnosis not present

## 2020-04-21 MED ORDER — AZACITIDINE CHEMO SQ INJECTION
75.0000 mg/m2 | Freq: Once | INTRAMUSCULAR | Status: AC
  Administered 2020-04-21: 152.5 mg via SUBCUTANEOUS
  Filled 2020-04-21: qty 6.1

## 2020-04-21 MED ORDER — ONDANSETRON HCL 8 MG PO TABS: 8.0000 mg | ORAL_TABLET | Freq: Once | ORAL | Status: DC

## 2020-04-21 NOTE — Patient Instructions (Signed)
Azacitidine suspension for injection (subcutaneous use) What is this medicine? AZACITIDINE (ay za SITE i deen) is a chemotherapy drug. This medicine reduces the growth of cancer cells and can suppress the immune system. It is used for treating myelodysplastic syndrome or some types of leukemia. This medicine may be used for other purposes; ask your health care provider or pharmacist if you have questions. COMMON BRAND NAME(S): Vidaza What should I tell my health care provider before I take this medicine? They need to know if you have any of these conditions:  kidney disease  liver disease  liver tumors  an unusual or allergic reaction to azacitidine, mannitol, other medicines, foods, dyes, or preservatives  pregnant or trying to get pregnant  breast-feeding How should I use this medicine? This medicine is for injection under the skin. It is administered in a hospital or clinic by a specially trained health care professional. Talk to your pediatrician regarding the use of this medicine in children. While this drug may be prescribed for selected conditions, precautions do apply. Overdosage: If you think you have taken too much of this medicine contact a poison control center or emergency room at once. NOTE: This medicine is only for you. Do not share this medicine with others. What if I miss a dose? It is important not to miss your dose. Call your doctor or health care professional if you are unable to keep an appointment. What may interact with this medicine? Interactions have not been studied. Give your health care provider a list of all the medicines, herbs, non-prescription drugs, or dietary supplements you use. Also tell them if you smoke, drink alcohol, or use illegal drugs. Some items may interact with your medicine. This list may not describe all possible interactions. Give your health care provider a list of all the medicines, herbs, non-prescription drugs, or dietary supplements  you use. Also tell them if you smoke, drink alcohol, or use illegal drugs. Some items may interact with your medicine. What should I watch for while using this medicine? Visit your doctor for checks on your progress. This drug may make you feel generally unwell. This is not uncommon, as chemotherapy can affect healthy cells as well as cancer cells. Report any side effects. Continue your course of treatment even though you feel ill unless your doctor tells you to stop. In some cases, you may be given additional medicines to help with side effects. Follow all directions for their use. Call your doctor or health care professional for advice if you get a fever, chills or sore throat, or other symptoms of a cold or flu. Do not treat yourself. This drug decreases your body's ability to fight infections. Try to avoid being around people who are sick. This medicine may increase your risk to bruise or bleed. Call your doctor or health care professional if you notice any unusual bleeding. You may need blood work done while you are taking this medicine. Do not become pregnant while taking this medicine and for 6 months after the last dose. Women should inform their doctor if they wish to become pregnant or think they might be pregnant. Men should not father a child while taking this medicine and for 3 months after the last dose. There is a potential for serious side effects to an unborn child. Talk to your health care professional or pharmacist for more information. Do not breast-feed an infant while taking this medicine and for 1 week after the last dose. This medicine may interfere with   the ability to have a child. Talk with your doctor or health care professional if you are concerned about your fertility. What side effects may I notice from receiving this medicine? Side effects that you should report to your doctor or health care professional as soon as possible:  allergic reactions like skin rash, itching or  hives, swelling of the face, lips, or tongue  low blood counts - this medicine may decrease the number of white blood cells, red blood cells and platelets. You may be at increased risk for infections and bleeding.  signs of infection - fever or chills, cough, sore throat, pain passing urine  signs of decreased platelets or bleeding - bruising, pinpoint red spots on the skin, black, tarry stools, blood in the urine  signs of decreased red blood cells - unusually weak or tired, fainting spells, lightheadedness  signs and symptoms of kidney injury like trouble passing urine or change in the amount of urine  signs and symptoms of liver injury like dark yellow or brown urine; general ill feeling or flu-like symptoms; light-colored stools; loss of appetite; nausea; right upper belly pain; unusually weak or tired; yellowing of the eyes or skin Side effects that usually do not require medical attention (report to your doctor or health care professional if they continue or are bothersome):  constipation  diarrhea  nausea, vomiting  pain or redness at the injection site  unusually weak or tired This list may not describe all possible side effects. Call your doctor for medical advice about side effects. You may report side effects to FDA at 1-800-FDA-1088. Where should I keep my medicine? This drug is given in a hospital or clinic and will not be stored at home. NOTE: This sheet is a summary. It may not cover all possible information. If you have questions about this medicine, talk to your doctor, pharmacist, or health care provider.  2021 Elsevier/Gold Standard (2016-02-01 14:37:51)  

## 2020-04-22 ENCOUNTER — Inpatient Hospital Stay: Payer: Medicare HMO

## 2020-04-22 VITALS — BP 179/77 | HR 89 | Temp 97.9°F | Resp 18 | Ht 61.0 in | Wt 206.1 lb

## 2020-04-22 DIAGNOSIS — D4621 Refractory anemia with excess of blasts 1: Secondary | ICD-10-CM | POA: Diagnosis not present

## 2020-04-22 DIAGNOSIS — D46Z Other myelodysplastic syndromes: Secondary | ICD-10-CM

## 2020-04-22 DIAGNOSIS — Z79899 Other long term (current) drug therapy: Secondary | ICD-10-CM | POA: Diagnosis not present

## 2020-04-22 DIAGNOSIS — Z5111 Encounter for antineoplastic chemotherapy: Secondary | ICD-10-CM | POA: Diagnosis not present

## 2020-04-22 MED ORDER — ONDANSETRON HCL 8 MG PO TABS: 8.0000 mg | ORAL_TABLET | Freq: Once | ORAL | Status: DC

## 2020-04-22 MED ORDER — AZACITIDINE CHEMO SQ INJECTION
75.0000 mg/m2 | Freq: Once | INTRAMUSCULAR | Status: AC
  Administered 2020-04-22: 152.5 mg via SUBCUTANEOUS
  Filled 2020-04-22: qty 6.1

## 2020-04-22 NOTE — Patient Instructions (Signed)
Azacitidine suspension for injection (subcutaneous use) What is this medicine? AZACITIDINE (ay za SITE i deen) is a chemotherapy drug. This medicine reduces the growth of cancer cells and can suppress the immune system. It is used for treating myelodysplastic syndrome or some types of leukemia. This medicine may be used for other purposes; ask your health care provider or pharmacist if you have questions. COMMON BRAND NAME(S): Vidaza What should I tell my health care provider before I take this medicine? They need to know if you have any of these conditions:  kidney disease  liver disease  liver tumors  an unusual or allergic reaction to azacitidine, mannitol, other medicines, foods, dyes, or preservatives  pregnant or trying to get pregnant  breast-feeding How should I use this medicine? This medicine is for injection under the skin. It is administered in a hospital or clinic by a specially trained health care professional. Talk to your pediatrician regarding the use of this medicine in children. While this drug may be prescribed for selected conditions, precautions do apply. Overdosage: If you think you have taken too much of this medicine contact a poison control center or emergency room at once. NOTE: This medicine is only for you. Do not share this medicine with others. What if I miss a dose? It is important not to miss your dose. Call your doctor or health care professional if you are unable to keep an appointment. What may interact with this medicine? Interactions have not been studied. Give your health care provider a list of all the medicines, herbs, non-prescription drugs, or dietary supplements you use. Also tell them if you smoke, drink alcohol, or use illegal drugs. Some items may interact with your medicine. This list may not describe all possible interactions. Give your health care provider a list of all the medicines, herbs, non-prescription drugs, or dietary supplements  you use. Also tell them if you smoke, drink alcohol, or use illegal drugs. Some items may interact with your medicine. What should I watch for while using this medicine? Visit your doctor for checks on your progress. This drug may make you feel generally unwell. This is not uncommon, as chemotherapy can affect healthy cells as well as cancer cells. Report any side effects. Continue your course of treatment even though you feel ill unless your doctor tells you to stop. In some cases, you may be given additional medicines to help with side effects. Follow all directions for their use. Call your doctor or health care professional for advice if you get a fever, chills or sore throat, or other symptoms of a cold or flu. Do not treat yourself. This drug decreases your body's ability to fight infections. Try to avoid being around people who are sick. This medicine may increase your risk to bruise or bleed. Call your doctor or health care professional if you notice any unusual bleeding. You may need blood work done while you are taking this medicine. Do not become pregnant while taking this medicine and for 6 months after the last dose. Women should inform their doctor if they wish to become pregnant or think they might be pregnant. Men should not father a child while taking this medicine and for 3 months after the last dose. There is a potential for serious side effects to an unborn child. Talk to your health care professional or pharmacist for more information. Do not breast-feed an infant while taking this medicine and for 1 week after the last dose. This medicine may interfere with   the ability to have a child. Talk with your doctor or health care professional if you are concerned about your fertility. What side effects may I notice from receiving this medicine? Side effects that you should report to your doctor or health care professional as soon as possible:  allergic reactions like skin rash, itching or  hives, swelling of the face, lips, or tongue  low blood counts - this medicine may decrease the number of white blood cells, red blood cells and platelets. You may be at increased risk for infections and bleeding.  signs of infection - fever or chills, cough, sore throat, pain passing urine  signs of decreased platelets or bleeding - bruising, pinpoint red spots on the skin, black, tarry stools, blood in the urine  signs of decreased red blood cells - unusually weak or tired, fainting spells, lightheadedness  signs and symptoms of kidney injury like trouble passing urine or change in the amount of urine  signs and symptoms of liver injury like dark yellow or brown urine; general ill feeling or flu-like symptoms; light-colored stools; loss of appetite; nausea; right upper belly pain; unusually weak or tired; yellowing of the eyes or skin Side effects that usually do not require medical attention (report to your doctor or health care professional if they continue or are bothersome):  constipation  diarrhea  nausea, vomiting  pain or redness at the injection site  unusually weak or tired This list may not describe all possible side effects. Call your doctor for medical advice about side effects. You may report side effects to FDA at 1-800-FDA-1088. Where should I keep my medicine? This drug is given in a hospital or clinic and will not be stored at home. NOTE: This sheet is a summary. It may not cover all possible information. If you have questions about this medicine, talk to your doctor, pharmacist, or health care provider.  2021 Elsevier/Gold Standard (2016-02-01 14:37:51)  

## 2020-04-23 ENCOUNTER — Other Ambulatory Visit: Payer: Self-pay

## 2020-04-23 ENCOUNTER — Inpatient Hospital Stay: Payer: Medicare HMO

## 2020-04-23 VITALS — BP 191/84 | HR 70 | Temp 98.1°F | Resp 18 | Ht 61.0 in | Wt 205.8 lb

## 2020-04-23 DIAGNOSIS — D4621 Refractory anemia with excess of blasts 1: Secondary | ICD-10-CM | POA: Diagnosis not present

## 2020-04-23 DIAGNOSIS — Z5111 Encounter for antineoplastic chemotherapy: Secondary | ICD-10-CM | POA: Diagnosis not present

## 2020-04-23 DIAGNOSIS — E039 Hypothyroidism, unspecified: Secondary | ICD-10-CM | POA: Diagnosis not present

## 2020-04-23 DIAGNOSIS — E1151 Type 2 diabetes mellitus with diabetic peripheral angiopathy without gangrene: Secondary | ICD-10-CM | POA: Diagnosis not present

## 2020-04-23 DIAGNOSIS — I872 Venous insufficiency (chronic) (peripheral): Secondary | ICD-10-CM | POA: Diagnosis not present

## 2020-04-23 DIAGNOSIS — I509 Heart failure, unspecified: Secondary | ICD-10-CM | POA: Diagnosis not present

## 2020-04-23 DIAGNOSIS — F32A Depression, unspecified: Secondary | ICD-10-CM | POA: Diagnosis not present

## 2020-04-23 DIAGNOSIS — I4891 Unspecified atrial fibrillation: Secondary | ICD-10-CM | POA: Diagnosis not present

## 2020-04-23 DIAGNOSIS — I110 Hypertensive heart disease with heart failure: Secondary | ICD-10-CM | POA: Diagnosis not present

## 2020-04-23 DIAGNOSIS — Z79899 Other long term (current) drug therapy: Secondary | ICD-10-CM | POA: Diagnosis not present

## 2020-04-23 DIAGNOSIS — D46Z Other myelodysplastic syndromes: Secondary | ICD-10-CM

## 2020-04-23 DIAGNOSIS — T8189XD Other complications of procedures, not elsewhere classified, subsequent encounter: Secondary | ICD-10-CM | POA: Diagnosis not present

## 2020-04-23 DIAGNOSIS — L89153 Pressure ulcer of sacral region, stage 3: Secondary | ICD-10-CM | POA: Diagnosis not present

## 2020-04-23 MED ORDER — ONDANSETRON HCL 8 MG PO TABS: 8.0000 mg | ORAL_TABLET | Freq: Once | ORAL | Status: DC

## 2020-04-23 MED ORDER — AZACITIDINE CHEMO SQ INJECTION
75.0000 mg/m2 | Freq: Once | INTRAMUSCULAR | Status: AC
  Administered 2020-04-23: 152.5 mg via SUBCUTANEOUS
  Filled 2020-04-23: qty 6.1

## 2020-04-23 NOTE — Patient Instructions (Signed)
Azacitidine suspension for injection (subcutaneous use) What is this medicine? AZACITIDINE (ay za SITE i deen) is a chemotherapy drug. This medicine reduces the growth of cancer cells and can suppress the immune system. It is used for treating myelodysplastic syndrome or some types of leukemia. This medicine may be used for other purposes; ask your health care provider or pharmacist if you have questions. COMMON BRAND NAME(S): Vidaza What should I tell my health care provider before I take this medicine? They need to know if you have any of these conditions:  kidney disease  liver disease  liver tumors  an unusual or allergic reaction to azacitidine, mannitol, other medicines, foods, dyes, or preservatives  pregnant or trying to get pregnant  breast-feeding How should I use this medicine? This medicine is for injection under the skin. It is administered in a hospital or clinic by a specially trained health care professional. Talk to your pediatrician regarding the use of this medicine in children. While this drug may be prescribed for selected conditions, precautions do apply. Overdosage: If you think you have taken too much of this medicine contact a poison control center or emergency room at once. NOTE: This medicine is only for you. Do not share this medicine with others. What if I miss a dose? It is important not to miss your dose. Call your doctor or health care professional if you are unable to keep an appointment. What may interact with this medicine? Interactions have not been studied. Give your health care provider a list of all the medicines, herbs, non-prescription drugs, or dietary supplements you use. Also tell them if you smoke, drink alcohol, or use illegal drugs. Some items may interact with your medicine. This list may not describe all possible interactions. Give your health care provider a list of all the medicines, herbs, non-prescription drugs, or dietary supplements  you use. Also tell them if you smoke, drink alcohol, or use illegal drugs. Some items may interact with your medicine. What should I watch for while using this medicine? Visit your doctor for checks on your progress. This drug may make you feel generally unwell. This is not uncommon, as chemotherapy can affect healthy cells as well as cancer cells. Report any side effects. Continue your course of treatment even though you feel ill unless your doctor tells you to stop. In some cases, you may be given additional medicines to help with side effects. Follow all directions for their use. Call your doctor or health care professional for advice if you get a fever, chills or sore throat, or other symptoms of a cold or flu. Do not treat yourself. This drug decreases your body's ability to fight infections. Try to avoid being around people who are sick. This medicine may increase your risk to bruise or bleed. Call your doctor or health care professional if you notice any unusual bleeding. You may need blood work done while you are taking this medicine. Do not become pregnant while taking this medicine and for 6 months after the last dose. Women should inform their doctor if they wish to become pregnant or think they might be pregnant. Men should not father a child while taking this medicine and for 3 months after the last dose. There is a potential for serious side effects to an unborn child. Talk to your health care professional or pharmacist for more information. Do not breast-feed an infant while taking this medicine and for 1 week after the last dose. This medicine may interfere with   the ability to have a child. Talk with your doctor or health care professional if you are concerned about your fertility. What side effects may I notice from receiving this medicine? Side effects that you should report to your doctor or health care professional as soon as possible:  allergic reactions like skin rash, itching or  hives, swelling of the face, lips, or tongue  low blood counts - this medicine may decrease the number of white blood cells, red blood cells and platelets. You may be at increased risk for infections and bleeding.  signs of infection - fever or chills, cough, sore throat, pain passing urine  signs of decreased platelets or bleeding - bruising, pinpoint red spots on the skin, black, tarry stools, blood in the urine  signs of decreased red blood cells - unusually weak or tired, fainting spells, lightheadedness  signs and symptoms of kidney injury like trouble passing urine or change in the amount of urine  signs and symptoms of liver injury like dark yellow or brown urine; general ill feeling or flu-like symptoms; light-colored stools; loss of appetite; nausea; right upper belly pain; unusually weak or tired; yellowing of the eyes or skin Side effects that usually do not require medical attention (report to your doctor or health care professional if they continue or are bothersome):  constipation  diarrhea  nausea, vomiting  pain or redness at the injection site  unusually weak or tired This list may not describe all possible side effects. Call your doctor for medical advice about side effects. You may report side effects to FDA at 1-800-FDA-1088. Where should I keep my medicine? This drug is given in a hospital or clinic and will not be stored at home. NOTE: This sheet is a summary. It may not cover all possible information. If you have questions about this medicine, talk to your doctor, pharmacist, or health care provider.  2021 Elsevier/Gold Standard (2016-02-01 14:37:51)  

## 2020-04-26 ENCOUNTER — Other Ambulatory Visit: Payer: Self-pay

## 2020-04-26 ENCOUNTER — Inpatient Hospital Stay: Payer: Medicare HMO

## 2020-04-26 VITALS — BP 146/59 | HR 80 | Temp 98.3°F | Resp 16 | Ht 61.0 in | Wt 206.8 lb

## 2020-04-26 DIAGNOSIS — L89153 Pressure ulcer of sacral region, stage 3: Secondary | ICD-10-CM | POA: Diagnosis not present

## 2020-04-26 DIAGNOSIS — Z950 Presence of cardiac pacemaker: Secondary | ICD-10-CM | POA: Diagnosis not present

## 2020-04-26 DIAGNOSIS — I110 Hypertensive heart disease with heart failure: Secondary | ICD-10-CM | POA: Diagnosis not present

## 2020-04-26 DIAGNOSIS — F32A Depression, unspecified: Secondary | ICD-10-CM | POA: Diagnosis not present

## 2020-04-26 DIAGNOSIS — E1151 Type 2 diabetes mellitus with diabetic peripheral angiopathy without gangrene: Secondary | ICD-10-CM | POA: Diagnosis not present

## 2020-04-26 DIAGNOSIS — I442 Atrioventricular block, complete: Secondary | ICD-10-CM | POA: Diagnosis not present

## 2020-04-26 DIAGNOSIS — I10 Essential (primary) hypertension: Secondary | ICD-10-CM | POA: Diagnosis not present

## 2020-04-26 DIAGNOSIS — I4891 Unspecified atrial fibrillation: Secondary | ICD-10-CM | POA: Diagnosis not present

## 2020-04-26 DIAGNOSIS — T8189XD Other complications of procedures, not elsewhere classified, subsequent encounter: Secondary | ICD-10-CM | POA: Diagnosis not present

## 2020-04-26 DIAGNOSIS — I872 Venous insufficiency (chronic) (peripheral): Secondary | ICD-10-CM | POA: Diagnosis not present

## 2020-04-26 DIAGNOSIS — Z7901 Long term (current) use of anticoagulants: Secondary | ICD-10-CM | POA: Diagnosis not present

## 2020-04-26 DIAGNOSIS — Z5111 Encounter for antineoplastic chemotherapy: Secondary | ICD-10-CM | POA: Diagnosis not present

## 2020-04-26 DIAGNOSIS — I4821 Permanent atrial fibrillation: Secondary | ICD-10-CM | POA: Diagnosis not present

## 2020-04-26 DIAGNOSIS — I509 Heart failure, unspecified: Secondary | ICD-10-CM | POA: Diagnosis not present

## 2020-04-26 DIAGNOSIS — D46Z Other myelodysplastic syndromes: Secondary | ICD-10-CM

## 2020-04-26 DIAGNOSIS — Z79899 Other long term (current) drug therapy: Secondary | ICD-10-CM | POA: Diagnosis not present

## 2020-04-26 DIAGNOSIS — D4621 Refractory anemia with excess of blasts 1: Secondary | ICD-10-CM | POA: Diagnosis not present

## 2020-04-26 DIAGNOSIS — R609 Edema, unspecified: Secondary | ICD-10-CM | POA: Diagnosis not present

## 2020-04-26 DIAGNOSIS — E039 Hypothyroidism, unspecified: Secondary | ICD-10-CM | POA: Diagnosis not present

## 2020-04-26 MED ORDER — AZACITIDINE CHEMO SQ INJECTION
75.0000 mg/m2 | Freq: Once | INTRAMUSCULAR | Status: AC
  Administered 2020-04-26: 152.5 mg via SUBCUTANEOUS
  Filled 2020-04-26: qty 6.1

## 2020-04-26 MED ORDER — ONDANSETRON HCL 8 MG PO TABS: 8.0000 mg | ORAL_TABLET | Freq: Once | ORAL | Status: DC

## 2020-04-26 NOTE — Patient Instructions (Signed)
Azacitidine suspension for injection (subcutaneous use) What is this medicine? AZACITIDINE (ay za SITE i deen) is a chemotherapy drug. This medicine reduces the growth of cancer cells and can suppress the immune system. It is used for treating myelodysplastic syndrome or some types of leukemia. This medicine may be used for other purposes; ask your health care provider or pharmacist if you have questions. COMMON BRAND NAME(S): Vidaza What should I tell my health care provider before I take this medicine? They need to know if you have any of these conditions:  kidney disease  liver disease  liver tumors  an unusual or allergic reaction to azacitidine, mannitol, other medicines, foods, dyes, or preservatives  pregnant or trying to get pregnant  breast-feeding How should I use this medicine? This medicine is for injection under the skin. It is administered in a hospital or clinic by a specially trained health care professional. Talk to your pediatrician regarding the use of this medicine in children. While this drug may be prescribed for selected conditions, precautions do apply. Overdosage: If you think you have taken too much of this medicine contact a poison control center or emergency room at once. NOTE: This medicine is only for you. Do not share this medicine with others. What if I miss a dose? It is important not to miss your dose. Call your doctor or health care professional if you are unable to keep an appointment. What may interact with this medicine? Interactions have not been studied. Give your health care provider a list of all the medicines, herbs, non-prescription drugs, or dietary supplements you use. Also tell them if you smoke, drink alcohol, or use illegal drugs. Some items may interact with your medicine. This list may not describe all possible interactions. Give your health care provider a list of all the medicines, herbs, non-prescription drugs, or dietary supplements  you use. Also tell them if you smoke, drink alcohol, or use illegal drugs. Some items may interact with your medicine. What should I watch for while using this medicine? Visit your doctor for checks on your progress. This drug may make you feel generally unwell. This is not uncommon, as chemotherapy can affect healthy cells as well as cancer cells. Report any side effects. Continue your course of treatment even though you feel ill unless your doctor tells you to stop. In some cases, you may be given additional medicines to help with side effects. Follow all directions for their use. Call your doctor or health care professional for advice if you get a fever, chills or sore throat, or other symptoms of a cold or flu. Do not treat yourself. This drug decreases your body's ability to fight infections. Try to avoid being around people who are sick. This medicine may increase your risk to bruise or bleed. Call your doctor or health care professional if you notice any unusual bleeding. You may need blood work done while you are taking this medicine. Do not become pregnant while taking this medicine and for 6 months after the last dose. Women should inform their doctor if they wish to become pregnant or think they might be pregnant. Men should not father a child while taking this medicine and for 3 months after the last dose. There is a potential for serious side effects to an unborn child. Talk to your health care professional or pharmacist for more information. Do not breast-feed an infant while taking this medicine and for 1 week after the last dose. This medicine may interfere with   the ability to have a child. Talk with your doctor or health care professional if you are concerned about your fertility. What side effects may I notice from receiving this medicine? Side effects that you should report to your doctor or health care professional as soon as possible:  allergic reactions like skin rash, itching or  hives, swelling of the face, lips, or tongue  low blood counts - this medicine may decrease the number of white blood cells, red blood cells and platelets. You may be at increased risk for infections and bleeding.  signs of infection - fever or chills, cough, sore throat, pain passing urine  signs of decreased platelets or bleeding - bruising, pinpoint red spots on the skin, black, tarry stools, blood in the urine  signs of decreased red blood cells - unusually weak or tired, fainting spells, lightheadedness  signs and symptoms of kidney injury like trouble passing urine or change in the amount of urine  signs and symptoms of liver injury like dark yellow or brown urine; general ill feeling or flu-like symptoms; light-colored stools; loss of appetite; nausea; right upper belly pain; unusually weak or tired; yellowing of the eyes or skin Side effects that usually do not require medical attention (report to your doctor or health care professional if they continue or are bothersome):  constipation  diarrhea  nausea, vomiting  pain or redness at the injection site  unusually weak or tired This list may not describe all possible side effects. Call your doctor for medical advice about side effects. You may report side effects to FDA at 1-800-FDA-1088. Where should I keep my medicine? This drug is given in a hospital or clinic and will not be stored at home. NOTE: This sheet is a summary. It may not cover all possible information. If you have questions about this medicine, talk to your doctor, pharmacist, or health care provider.  2021 Elsevier/Gold Standard (2016-02-01 14:37:51)  

## 2020-04-27 ENCOUNTER — Inpatient Hospital Stay: Payer: Medicare HMO

## 2020-04-27 VITALS — BP 129/65 | HR 76 | Temp 98.1°F | Resp 18 | Ht 61.0 in | Wt 206.0 lb

## 2020-04-27 DIAGNOSIS — I509 Heart failure, unspecified: Secondary | ICD-10-CM | POA: Diagnosis not present

## 2020-04-27 DIAGNOSIS — Z79899 Other long term (current) drug therapy: Secondary | ICD-10-CM | POA: Diagnosis not present

## 2020-04-27 DIAGNOSIS — L89153 Pressure ulcer of sacral region, stage 3: Secondary | ICD-10-CM | POA: Diagnosis not present

## 2020-04-27 DIAGNOSIS — T8189XD Other complications of procedures, not elsewhere classified, subsequent encounter: Secondary | ICD-10-CM | POA: Diagnosis not present

## 2020-04-27 DIAGNOSIS — Z5111 Encounter for antineoplastic chemotherapy: Secondary | ICD-10-CM | POA: Diagnosis not present

## 2020-04-27 DIAGNOSIS — F32A Depression, unspecified: Secondary | ICD-10-CM | POA: Diagnosis not present

## 2020-04-27 DIAGNOSIS — I4891 Unspecified atrial fibrillation: Secondary | ICD-10-CM | POA: Diagnosis not present

## 2020-04-27 DIAGNOSIS — E1151 Type 2 diabetes mellitus with diabetic peripheral angiopathy without gangrene: Secondary | ICD-10-CM | POA: Diagnosis not present

## 2020-04-27 DIAGNOSIS — D4621 Refractory anemia with excess of blasts 1: Secondary | ICD-10-CM | POA: Diagnosis not present

## 2020-04-27 DIAGNOSIS — I110 Hypertensive heart disease with heart failure: Secondary | ICD-10-CM | POA: Diagnosis not present

## 2020-04-27 DIAGNOSIS — I872 Venous insufficiency (chronic) (peripheral): Secondary | ICD-10-CM | POA: Diagnosis not present

## 2020-04-27 DIAGNOSIS — E039 Hypothyroidism, unspecified: Secondary | ICD-10-CM | POA: Diagnosis not present

## 2020-04-27 DIAGNOSIS — D46Z Other myelodysplastic syndromes: Secondary | ICD-10-CM

## 2020-04-27 MED ORDER — AZACITIDINE CHEMO SQ INJECTION
75.0000 mg/m2 | Freq: Once | INTRAMUSCULAR | Status: AC
  Administered 2020-04-27: 152.5 mg via SUBCUTANEOUS
  Filled 2020-04-27: qty 6.1

## 2020-04-27 MED ORDER — ONDANSETRON HCL 8 MG PO TABS: 8.0000 mg | ORAL_TABLET | Freq: Once | ORAL | Status: DC

## 2020-04-27 NOTE — Patient Instructions (Signed)
Azacitidine suspension for injection (subcutaneous use) What is this medicine? AZACITIDINE (ay za SITE i deen) is a chemotherapy drug. This medicine reduces the growth of cancer cells and can suppress the immune system. It is used for treating myelodysplastic syndrome or some types of leukemia. This medicine may be used for other purposes; ask your health care provider or pharmacist if you have questions. COMMON BRAND NAME(S): Vidaza What should I tell my health care provider before I take this medicine? They need to know if you have any of these conditions:  kidney disease  liver disease  liver tumors  an unusual or allergic reaction to azacitidine, mannitol, other medicines, foods, dyes, or preservatives  pregnant or trying to get pregnant  breast-feeding How should I use this medicine? This medicine is for injection under the skin. It is administered in a hospital or clinic by a specially trained health care professional. Talk to your pediatrician regarding the use of this medicine in children. While this drug may be prescribed for selected conditions, precautions do apply. Overdosage: If you think you have taken too much of this medicine contact a poison control center or emergency room at once. NOTE: This medicine is only for you. Do not share this medicine with others. What if I miss a dose? It is important not to miss your dose. Call your doctor or health care professional if you are unable to keep an appointment. What may interact with this medicine? Interactions have not been studied. Give your health care provider a list of all the medicines, herbs, non-prescription drugs, or dietary supplements you use. Also tell them if you smoke, drink alcohol, or use illegal drugs. Some items may interact with your medicine. This list may not describe all possible interactions. Give your health care provider a list of all the medicines, herbs, non-prescription drugs, or dietary supplements  you use. Also tell them if you smoke, drink alcohol, or use illegal drugs. Some items may interact with your medicine. What should I watch for while using this medicine? Visit your doctor for checks on your progress. This drug may make you feel generally unwell. This is not uncommon, as chemotherapy can affect healthy cells as well as cancer cells. Report any side effects. Continue your course of treatment even though you feel ill unless your doctor tells you to stop. In some cases, you may be given additional medicines to help with side effects. Follow all directions for their use. Call your doctor or health care professional for advice if you get a fever, chills or sore throat, or other symptoms of a cold or flu. Do not treat yourself. This drug decreases your body's ability to fight infections. Try to avoid being around people who are sick. This medicine may increase your risk to bruise or bleed. Call your doctor or health care professional if you notice any unusual bleeding. You may need blood work done while you are taking this medicine. Do not become pregnant while taking this medicine and for 6 months after the last dose. Women should inform their doctor if they wish to become pregnant or think they might be pregnant. Men should not father a child while taking this medicine and for 3 months after the last dose. There is a potential for serious side effects to an unborn child. Talk to your health care professional or pharmacist for more information. Do not breast-feed an infant while taking this medicine and for 1 week after the last dose. This medicine may interfere with   the ability to have a child. Talk with your doctor or health care professional if you are concerned about your fertility. What side effects may I notice from receiving this medicine? Side effects that you should report to your doctor or health care professional as soon as possible:  allergic reactions like skin rash, itching or  hives, swelling of the face, lips, or tongue  low blood counts - this medicine may decrease the number of white blood cells, red blood cells and platelets. You may be at increased risk for infections and bleeding.  signs of infection - fever or chills, cough, sore throat, pain passing urine  signs of decreased platelets or bleeding - bruising, pinpoint red spots on the skin, black, tarry stools, blood in the urine  signs of decreased red blood cells - unusually weak or tired, fainting spells, lightheadedness  signs and symptoms of kidney injury like trouble passing urine or change in the amount of urine  signs and symptoms of liver injury like dark yellow or brown urine; general ill feeling or flu-like symptoms; light-colored stools; loss of appetite; nausea; right upper belly pain; unusually weak or tired; yellowing of the eyes or skin Side effects that usually do not require medical attention (report to your doctor or health care professional if they continue or are bothersome):  constipation  diarrhea  nausea, vomiting  pain or redness at the injection site  unusually weak or tired This list may not describe all possible side effects. Call your doctor for medical advice about side effects. You may report side effects to FDA at 1-800-FDA-1088. Where should I keep my medicine? This drug is given in a hospital or clinic and will not be stored at home. NOTE: This sheet is a summary. It may not cover all possible information. If you have questions about this medicine, talk to your doctor, pharmacist, or health care provider.  2021 Elsevier/Gold Standard (2016-02-01 14:37:51)  

## 2020-04-28 DIAGNOSIS — E1151 Type 2 diabetes mellitus with diabetic peripheral angiopathy without gangrene: Secondary | ICD-10-CM | POA: Diagnosis not present

## 2020-04-28 DIAGNOSIS — E039 Hypothyroidism, unspecified: Secondary | ICD-10-CM | POA: Diagnosis not present

## 2020-04-28 DIAGNOSIS — I4891 Unspecified atrial fibrillation: Secondary | ICD-10-CM | POA: Diagnosis not present

## 2020-04-28 DIAGNOSIS — F32A Depression, unspecified: Secondary | ICD-10-CM | POA: Diagnosis not present

## 2020-04-28 DIAGNOSIS — L89153 Pressure ulcer of sacral region, stage 3: Secondary | ICD-10-CM | POA: Diagnosis not present

## 2020-04-28 DIAGNOSIS — T8189XD Other complications of procedures, not elsewhere classified, subsequent encounter: Secondary | ICD-10-CM | POA: Diagnosis not present

## 2020-04-28 DIAGNOSIS — I872 Venous insufficiency (chronic) (peripheral): Secondary | ICD-10-CM | POA: Diagnosis not present

## 2020-04-28 DIAGNOSIS — I110 Hypertensive heart disease with heart failure: Secondary | ICD-10-CM | POA: Diagnosis not present

## 2020-04-28 DIAGNOSIS — I509 Heart failure, unspecified: Secondary | ICD-10-CM | POA: Diagnosis not present

## 2020-04-28 NOTE — Progress Notes (Signed)
Sent in request for DOS 05/12/2020, to [facility]

## 2020-04-30 DIAGNOSIS — L89153 Pressure ulcer of sacral region, stage 3: Secondary | ICD-10-CM | POA: Diagnosis not present

## 2020-04-30 DIAGNOSIS — E1151 Type 2 diabetes mellitus with diabetic peripheral angiopathy without gangrene: Secondary | ICD-10-CM | POA: Diagnosis not present

## 2020-04-30 DIAGNOSIS — I509 Heart failure, unspecified: Secondary | ICD-10-CM | POA: Diagnosis not present

## 2020-04-30 DIAGNOSIS — E039 Hypothyroidism, unspecified: Secondary | ICD-10-CM | POA: Diagnosis not present

## 2020-04-30 DIAGNOSIS — I872 Venous insufficiency (chronic) (peripheral): Secondary | ICD-10-CM | POA: Diagnosis not present

## 2020-04-30 DIAGNOSIS — T8189XD Other complications of procedures, not elsewhere classified, subsequent encounter: Secondary | ICD-10-CM | POA: Diagnosis not present

## 2020-04-30 DIAGNOSIS — I4891 Unspecified atrial fibrillation: Secondary | ICD-10-CM | POA: Diagnosis not present

## 2020-04-30 DIAGNOSIS — F32A Depression, unspecified: Secondary | ICD-10-CM | POA: Diagnosis not present

## 2020-04-30 DIAGNOSIS — I110 Hypertensive heart disease with heart failure: Secondary | ICD-10-CM | POA: Diagnosis not present

## 2020-05-03 DIAGNOSIS — E1151 Type 2 diabetes mellitus with diabetic peripheral angiopathy without gangrene: Secondary | ICD-10-CM | POA: Diagnosis not present

## 2020-05-03 DIAGNOSIS — E039 Hypothyroidism, unspecified: Secondary | ICD-10-CM | POA: Diagnosis not present

## 2020-05-03 DIAGNOSIS — I4891 Unspecified atrial fibrillation: Secondary | ICD-10-CM | POA: Diagnosis not present

## 2020-05-03 DIAGNOSIS — F32A Depression, unspecified: Secondary | ICD-10-CM | POA: Diagnosis not present

## 2020-05-03 DIAGNOSIS — I872 Venous insufficiency (chronic) (peripheral): Secondary | ICD-10-CM | POA: Diagnosis not present

## 2020-05-03 DIAGNOSIS — I110 Hypertensive heart disease with heart failure: Secondary | ICD-10-CM | POA: Diagnosis not present

## 2020-05-03 DIAGNOSIS — I509 Heart failure, unspecified: Secondary | ICD-10-CM | POA: Diagnosis not present

## 2020-05-03 DIAGNOSIS — T8189XD Other complications of procedures, not elsewhere classified, subsequent encounter: Secondary | ICD-10-CM | POA: Diagnosis not present

## 2020-05-03 DIAGNOSIS — L89153 Pressure ulcer of sacral region, stage 3: Secondary | ICD-10-CM | POA: Diagnosis not present

## 2020-05-04 ENCOUNTER — Other Ambulatory Visit: Payer: Self-pay

## 2020-05-04 DIAGNOSIS — K1231 Oral mucositis (ulcerative) due to antineoplastic therapy: Secondary | ICD-10-CM

## 2020-05-04 MED ORDER — MAGIC MOUTHWASH W/LIDOCAINE: 5.0000 mL | ORAL | 2 refills | Status: AC | PRN

## 2020-05-05 DIAGNOSIS — F32A Depression, unspecified: Secondary | ICD-10-CM | POA: Diagnosis not present

## 2020-05-05 DIAGNOSIS — I110 Hypertensive heart disease with heart failure: Secondary | ICD-10-CM | POA: Diagnosis not present

## 2020-05-05 DIAGNOSIS — L89153 Pressure ulcer of sacral region, stage 3: Secondary | ICD-10-CM | POA: Diagnosis not present

## 2020-05-05 DIAGNOSIS — I872 Venous insufficiency (chronic) (peripheral): Secondary | ICD-10-CM | POA: Diagnosis not present

## 2020-05-05 DIAGNOSIS — E039 Hypothyroidism, unspecified: Secondary | ICD-10-CM | POA: Diagnosis not present

## 2020-05-05 DIAGNOSIS — I4891 Unspecified atrial fibrillation: Secondary | ICD-10-CM | POA: Diagnosis not present

## 2020-05-05 DIAGNOSIS — E1151 Type 2 diabetes mellitus with diabetic peripheral angiopathy without gangrene: Secondary | ICD-10-CM | POA: Diagnosis not present

## 2020-05-05 DIAGNOSIS — I509 Heart failure, unspecified: Secondary | ICD-10-CM | POA: Diagnosis not present

## 2020-05-05 DIAGNOSIS — T8189XD Other complications of procedures, not elsewhere classified, subsequent encounter: Secondary | ICD-10-CM | POA: Diagnosis not present

## 2020-05-07 DIAGNOSIS — E1151 Type 2 diabetes mellitus with diabetic peripheral angiopathy without gangrene: Secondary | ICD-10-CM | POA: Diagnosis not present

## 2020-05-07 DIAGNOSIS — F32A Depression, unspecified: Secondary | ICD-10-CM | POA: Diagnosis not present

## 2020-05-07 DIAGNOSIS — E039 Hypothyroidism, unspecified: Secondary | ICD-10-CM | POA: Diagnosis not present

## 2020-05-07 DIAGNOSIS — I872 Venous insufficiency (chronic) (peripheral): Secondary | ICD-10-CM | POA: Diagnosis not present

## 2020-05-07 DIAGNOSIS — M6281 Muscle weakness (generalized): Secondary | ICD-10-CM | POA: Diagnosis not present

## 2020-05-07 DIAGNOSIS — L89153 Pressure ulcer of sacral region, stage 3: Secondary | ICD-10-CM | POA: Diagnosis not present

## 2020-05-07 DIAGNOSIS — Z7984 Long term (current) use of oral hypoglycemic drugs: Secondary | ICD-10-CM | POA: Diagnosis not present

## 2020-05-07 DIAGNOSIS — I509 Heart failure, unspecified: Secondary | ICD-10-CM | POA: Diagnosis not present

## 2020-05-07 DIAGNOSIS — Z7901 Long term (current) use of anticoagulants: Secondary | ICD-10-CM | POA: Diagnosis not present

## 2020-05-07 DIAGNOSIS — I4891 Unspecified atrial fibrillation: Secondary | ICD-10-CM | POA: Diagnosis not present

## 2020-05-07 DIAGNOSIS — I110 Hypertensive heart disease with heart failure: Secondary | ICD-10-CM | POA: Diagnosis not present

## 2020-05-07 DIAGNOSIS — T8189XD Other complications of procedures, not elsewhere classified, subsequent encounter: Secondary | ICD-10-CM | POA: Diagnosis not present

## 2020-05-10 DIAGNOSIS — E1151 Type 2 diabetes mellitus with diabetic peripheral angiopathy without gangrene: Secondary | ICD-10-CM | POA: Diagnosis not present

## 2020-05-10 DIAGNOSIS — E039 Hypothyroidism, unspecified: Secondary | ICD-10-CM | POA: Diagnosis not present

## 2020-05-10 DIAGNOSIS — L89153 Pressure ulcer of sacral region, stage 3: Secondary | ICD-10-CM | POA: Diagnosis not present

## 2020-05-10 DIAGNOSIS — I4891 Unspecified atrial fibrillation: Secondary | ICD-10-CM | POA: Diagnosis not present

## 2020-05-10 DIAGNOSIS — I509 Heart failure, unspecified: Secondary | ICD-10-CM | POA: Diagnosis not present

## 2020-05-10 DIAGNOSIS — I872 Venous insufficiency (chronic) (peripheral): Secondary | ICD-10-CM | POA: Diagnosis not present

## 2020-05-10 DIAGNOSIS — F32A Depression, unspecified: Secondary | ICD-10-CM | POA: Diagnosis not present

## 2020-05-10 DIAGNOSIS — I110 Hypertensive heart disease with heart failure: Secondary | ICD-10-CM | POA: Diagnosis not present

## 2020-05-10 DIAGNOSIS — T8189XD Other complications of procedures, not elsewhere classified, subsequent encounter: Secondary | ICD-10-CM | POA: Diagnosis not present

## 2020-05-12 ENCOUNTER — Inpatient Hospital Stay: Payer: Medicare HMO

## 2020-05-12 ENCOUNTER — Inpatient Hospital Stay: Payer: Medicare HMO | Admitting: Oncology

## 2020-05-12 ENCOUNTER — Other Ambulatory Visit: Payer: Self-pay

## 2020-05-12 ENCOUNTER — Other Ambulatory Visit: Payer: Self-pay | Admitting: Hematology and Oncology

## 2020-05-12 ENCOUNTER — Telehealth: Payer: Self-pay | Admitting: Oncology

## 2020-05-12 ENCOUNTER — Other Ambulatory Visit: Payer: Self-pay | Admitting: Oncology

## 2020-05-12 VITALS — BP 133/65 | HR 67 | Temp 98.5°F | Resp 16 | Ht 61.0 in | Wt 206.3 lb

## 2020-05-12 DIAGNOSIS — D46Z Other myelodysplastic syndromes: Secondary | ICD-10-CM | POA: Diagnosis not present

## 2020-05-12 DIAGNOSIS — D649 Anemia, unspecified: Secondary | ICD-10-CM | POA: Diagnosis not present

## 2020-05-12 DIAGNOSIS — E039 Hypothyroidism, unspecified: Secondary | ICD-10-CM | POA: Diagnosis not present

## 2020-05-12 DIAGNOSIS — I509 Heart failure, unspecified: Secondary | ICD-10-CM | POA: Diagnosis not present

## 2020-05-12 DIAGNOSIS — I4891 Unspecified atrial fibrillation: Secondary | ICD-10-CM | POA: Diagnosis not present

## 2020-05-12 DIAGNOSIS — L89153 Pressure ulcer of sacral region, stage 3: Secondary | ICD-10-CM | POA: Diagnosis not present

## 2020-05-12 DIAGNOSIS — I110 Hypertensive heart disease with heart failure: Secondary | ICD-10-CM | POA: Diagnosis not present

## 2020-05-12 DIAGNOSIS — T8189XD Other complications of procedures, not elsewhere classified, subsequent encounter: Secondary | ICD-10-CM | POA: Diagnosis not present

## 2020-05-12 DIAGNOSIS — I872 Venous insufficiency (chronic) (peripheral): Secondary | ICD-10-CM | POA: Diagnosis not present

## 2020-05-12 DIAGNOSIS — E1151 Type 2 diabetes mellitus with diabetic peripheral angiopathy without gangrene: Secondary | ICD-10-CM | POA: Diagnosis not present

## 2020-05-12 DIAGNOSIS — F32A Depression, unspecified: Secondary | ICD-10-CM | POA: Diagnosis not present

## 2020-05-12 LAB — COMPREHENSIVE METABOLIC PANEL: Calcium: 8.5 — AB (ref 8.7–10.7)

## 2020-05-12 LAB — BASIC METABOLIC PANEL
BUN: 17 (ref 4–21)
CO2: 29 — AB (ref 13–22)
Chloride: 101 (ref 99–108)
Creatinine: 0.9 (ref 0.5–1.1)
Glucose: 139
Potassium: 3.7 (ref 3.4–5.3)
Sodium: 135 — AB (ref 137–147)

## 2020-05-12 LAB — CBC AND DIFFERENTIAL
HCT: 27 — AB (ref 36–46)
Hemoglobin: 8.7 — AB (ref 12.0–16.0)
Neutrophils Absolute: 0.53
Platelets: 257 (ref 150–399)
WBC: 0.6

## 2020-05-12 LAB — CBC: RBC: 2.42 — AB (ref 3.87–5.11)

## 2020-05-12 NOTE — Progress Notes (Signed)
[facility]  [address] 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  Her bone marrow biopsy revealed 5-6% blasts present.  She comes in today to be evaluated before heading into her 7th cycle of azacitidine.  The patient claims to have tolerated her 6th cycle of treatment fairly well.  With respect to her myelodysplasia, the patient has gotten weaker over the past few weeks.  She also has had increased bruising  PHYSICAL EXAM:  Blood pressure 133/65, pulse 67, temperature 98.5 F (36.9 C), resp. rate 16, height 5' 1" (1.549 m), weight 206 lb 4.8 oz (93.6 kg), SpO2 93 %. Wt Readings from Last 3 Encounters:  05/12/20 206 lb 4.8 oz (93.6 kg)  04/27/20 206 lb (93.4 kg)  04/26/20 206 lb 12 oz (93.8 kg)   Body mass index is 38.98 kg/m. Performance status (ECOG): 1 Physical Exam Constitutional:      Appearance: Normal appearance. She is not ill-appearing.  HENT:     Mouth/Throat:     Mouth: Mucous membranes are moist.     Pharynx: Oropharynx is clear. No oropharyngeal exudate or posterior oropharyngeal erythema.  Cardiovascular:     Rate and Rhythm: Normal rate and regular rhythm.     Heart sounds: No murmur heard. No friction rub. No gallop.   Pulmonary:     Effort: Pulmonary effort is normal. No respiratory distress.     Breath sounds: Normal breath sounds. No wheezing, rhonchi or rales.  Chest:  Breasts:     Right: No axillary adenopathy or supraclavicular adenopathy.     Left: No axillary adenopathy or supraclavicular adenopathy.    Abdominal:     General: Bowel sounds are normal. There is no distension.     Palpations: Abdomen is soft. There is no mass.     Tenderness: There is no abdominal tenderness.  Musculoskeletal:        General: No swelling.     Right lower  leg: No edema.     Left lower leg: No edema.  Lymphadenopathy:     Cervical: No cervical adenopathy.     Upper Body:     Right upper body: No supraclavicular or axillary adenopathy.     Left upper body: No supraclavicular or axillary adenopathy.     Lower Body: No right inguinal adenopathy. No left inguinal adenopathy.  Skin:    General: Skin is warm.     Coloration: Skin is not jaundiced.     Findings: Bruising present. No lesion or rash.  Neurological:     General: No focal deficit present.     Mental Status: She is alert and oriented to person, place, and time. Mental status is at baseline.     Cranial Nerves: Cranial nerves are intact.  Psychiatric:        Mood and Affect: Mood normal.        Behavior: Behavior normal.        Thought Content: Thought content normal.     LABS:   CBC Latest Ref Rng & Units 05/12/2020 04/14/2020 03/15/2020  WBC - 0.6 1.8 1.6  Hemoglobin 12.0 - 16.0 8.7(A) 10.4(A) 10.8(A)  Hematocrit 36 - 46 27(A) 33(A) 33(A)  Platelets 150 - 399 257 212 645(A)   CMP Latest Ref Rng & Units 05/12/2020 04/14/2020 03/15/2020  Glucose 65 -  99 mg/dL - - -  BUN 4 - _0 43(A)  Creatinine 0.5 - 1.1 0.9 0.8 1.1  Sodium 137 - 147 135(A) 139 136(A)  Potassium 3.4 - 5.3 3.7 3.8 3.7  Chloride 99 - 108 101 103 100  CO2 13 - 22 29(A) 28(A) 26(A)  Calcium 8.7 - 10.7 8.5(A) 8.6(A) 8.7  Alkaline Phos 25 - 125 - - -  AST 13 - 35 - - -  ALT 7 - 35 - - -    ASSESSMENT & PLAN:  Assessment/Plan:  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  In clinic today, I went over her labs with her, for which she could see that her hemoglobin continues to drop.  Furthermore, her white count has dropped.  I am very concerned we are witnessing her disease losing sensitivity to her azacitidine.  Furthermore, I am concerned she may be actively transforming into acute leukemia. Ultimately, it would require a bone marrow biopsy to prove this.  The patient is not particularly  interested in undergoing a bone marrow biopsy at this time.  However, she understands if her counts continue to fall, the need for a bone marrow biopsy would be absolutely necessary.  Per her wishes, she will proceed with her 7th cycle of azacitidine this week.  I will see her back in 4 weeks before she heads into her 8th cycle of azacitidine.  The patient understands all the plans discussed today and is in agreement with them.    Carmon Sahli Macarthur Critchley, MD

## 2020-05-12 NOTE — Progress Notes (Signed)
Sent in transportation request for DOS 05/04, 05/05, 05/06, 05/09, 05/10, 05/11, and 05/12 to [employer].

## 2020-05-12 NOTE — Telephone Encounter (Signed)
Per 4/27 los next appt scheduled and given to patient

## 2020-05-13 DIAGNOSIS — F32A Depression, unspecified: Secondary | ICD-10-CM | POA: Diagnosis not present

## 2020-05-13 DIAGNOSIS — I509 Heart failure, unspecified: Secondary | ICD-10-CM | POA: Diagnosis not present

## 2020-05-13 DIAGNOSIS — I4891 Unspecified atrial fibrillation: Secondary | ICD-10-CM | POA: Diagnosis not present

## 2020-05-13 DIAGNOSIS — E1151 Type 2 diabetes mellitus with diabetic peripheral angiopathy without gangrene: Secondary | ICD-10-CM | POA: Diagnosis not present

## 2020-05-13 DIAGNOSIS — T8189XD Other complications of procedures, not elsewhere classified, subsequent encounter: Secondary | ICD-10-CM | POA: Diagnosis not present

## 2020-05-13 DIAGNOSIS — I110 Hypertensive heart disease with heart failure: Secondary | ICD-10-CM | POA: Diagnosis not present

## 2020-05-13 DIAGNOSIS — E039 Hypothyroidism, unspecified: Secondary | ICD-10-CM | POA: Diagnosis not present

## 2020-05-13 DIAGNOSIS — L89153 Pressure ulcer of sacral region, stage 3: Secondary | ICD-10-CM | POA: Diagnosis not present

## 2020-05-13 DIAGNOSIS — I872 Venous insufficiency (chronic) (peripheral): Secondary | ICD-10-CM | POA: Diagnosis not present

## 2020-05-14 DIAGNOSIS — E1151 Type 2 diabetes mellitus with diabetic peripheral angiopathy without gangrene: Secondary | ICD-10-CM | POA: Diagnosis not present

## 2020-05-14 DIAGNOSIS — I4891 Unspecified atrial fibrillation: Secondary | ICD-10-CM | POA: Diagnosis not present

## 2020-05-14 DIAGNOSIS — T8189XD Other complications of procedures, not elsewhere classified, subsequent encounter: Secondary | ICD-10-CM | POA: Diagnosis not present

## 2020-05-14 DIAGNOSIS — F32A Depression, unspecified: Secondary | ICD-10-CM | POA: Diagnosis not present

## 2020-05-14 DIAGNOSIS — E039 Hypothyroidism, unspecified: Secondary | ICD-10-CM | POA: Diagnosis not present

## 2020-05-14 DIAGNOSIS — I872 Venous insufficiency (chronic) (peripheral): Secondary | ICD-10-CM | POA: Diagnosis not present

## 2020-05-14 DIAGNOSIS — I110 Hypertensive heart disease with heart failure: Secondary | ICD-10-CM | POA: Diagnosis not present

## 2020-05-14 DIAGNOSIS — L89153 Pressure ulcer of sacral region, stage 3: Secondary | ICD-10-CM | POA: Diagnosis not present

## 2020-05-14 DIAGNOSIS — I509 Heart failure, unspecified: Secondary | ICD-10-CM | POA: Diagnosis not present

## 2020-05-15 DIAGNOSIS — I110 Hypertensive heart disease with heart failure: Secondary | ICD-10-CM | POA: Diagnosis not present

## 2020-05-15 DIAGNOSIS — I509 Heart failure, unspecified: Secondary | ICD-10-CM | POA: Diagnosis not present

## 2020-05-15 DIAGNOSIS — E785 Hyperlipidemia, unspecified: Secondary | ICD-10-CM | POA: Diagnosis not present

## 2020-05-15 DIAGNOSIS — E039 Hypothyroidism, unspecified: Secondary | ICD-10-CM | POA: Diagnosis not present

## 2020-05-17 ENCOUNTER — Telehealth: Payer: Self-pay | Admitting: Oncology

## 2020-05-17 DIAGNOSIS — L89153 Pressure ulcer of sacral region, stage 3: Secondary | ICD-10-CM | POA: Diagnosis not present

## 2020-05-17 DIAGNOSIS — E039 Hypothyroidism, unspecified: Secondary | ICD-10-CM | POA: Diagnosis not present

## 2020-05-17 DIAGNOSIS — T8189XD Other complications of procedures, not elsewhere classified, subsequent encounter: Secondary | ICD-10-CM | POA: Diagnosis not present

## 2020-05-17 DIAGNOSIS — I509 Heart failure, unspecified: Secondary | ICD-10-CM | POA: Diagnosis not present

## 2020-05-17 DIAGNOSIS — E1151 Type 2 diabetes mellitus with diabetic peripheral angiopathy without gangrene: Secondary | ICD-10-CM | POA: Diagnosis not present

## 2020-05-17 DIAGNOSIS — I4891 Unspecified atrial fibrillation: Secondary | ICD-10-CM | POA: Diagnosis not present

## 2020-05-17 DIAGNOSIS — I110 Hypertensive heart disease with heart failure: Secondary | ICD-10-CM | POA: Diagnosis not present

## 2020-05-17 DIAGNOSIS — F32A Depression, unspecified: Secondary | ICD-10-CM | POA: Diagnosis not present

## 2020-05-17 DIAGNOSIS — I872 Venous insufficiency (chronic) (peripheral): Secondary | ICD-10-CM | POA: Diagnosis not present

## 2020-05-17 NOTE — Telephone Encounter (Signed)
Per patient, this week's Vidaza was to be canceled due to agreeing on having Bone Marrow Biopsy.  Canceled the injections (was this correct?)

## 2020-05-18 ENCOUNTER — Telehealth: Payer: Self-pay | Admitting: Oncology

## 2020-05-18 NOTE — Telephone Encounter (Signed)
Canceled Vidaza Appt's per patient

## 2020-05-19 ENCOUNTER — Inpatient Hospital Stay: Payer: Medicare HMO

## 2020-05-19 ENCOUNTER — Other Ambulatory Visit: Payer: Self-pay | Admitting: Hematology and Oncology

## 2020-05-19 ENCOUNTER — Telehealth: Payer: Self-pay

## 2020-05-19 ENCOUNTER — Inpatient Hospital Stay: Payer: Medicare HMO | Attending: Oncology

## 2020-05-19 DIAGNOSIS — L89153 Pressure ulcer of sacral region, stage 3: Secondary | ICD-10-CM | POA: Diagnosis not present

## 2020-05-19 DIAGNOSIS — E039 Hypothyroidism, unspecified: Secondary | ICD-10-CM | POA: Diagnosis not present

## 2020-05-19 DIAGNOSIS — E1151 Type 2 diabetes mellitus with diabetic peripheral angiopathy without gangrene: Secondary | ICD-10-CM | POA: Diagnosis not present

## 2020-05-19 DIAGNOSIS — D46Z Other myelodysplastic syndromes: Secondary | ICD-10-CM | POA: Diagnosis not present

## 2020-05-19 DIAGNOSIS — I110 Hypertensive heart disease with heart failure: Secondary | ICD-10-CM | POA: Diagnosis not present

## 2020-05-19 DIAGNOSIS — D649 Anemia, unspecified: Secondary | ICD-10-CM | POA: Diagnosis not present

## 2020-05-19 DIAGNOSIS — I4891 Unspecified atrial fibrillation: Secondary | ICD-10-CM | POA: Diagnosis not present

## 2020-05-19 DIAGNOSIS — I509 Heart failure, unspecified: Secondary | ICD-10-CM | POA: Diagnosis not present

## 2020-05-19 DIAGNOSIS — I872 Venous insufficiency (chronic) (peripheral): Secondary | ICD-10-CM | POA: Diagnosis not present

## 2020-05-19 DIAGNOSIS — T8189XD Other complications of procedures, not elsewhere classified, subsequent encounter: Secondary | ICD-10-CM | POA: Diagnosis not present

## 2020-05-19 DIAGNOSIS — F32A Depression, unspecified: Secondary | ICD-10-CM | POA: Diagnosis not present

## 2020-05-19 LAB — CBC: RBC: 2.38 — AB (ref 3.87–5.11)

## 2020-05-19 LAB — BASIC METABOLIC PANEL
BUN: 21 (ref 4–21)
CO2: 29 — AB (ref 13–22)
Chloride: 99 (ref 99–108)
Creatinine: 0.8 (ref 0.5–1.1)
Glucose: 129
Potassium: 4 (ref 3.4–5.3)
Sodium: 137 (ref 137–147)

## 2020-05-19 LAB — CBC AND DIFFERENTIAL
HCT: 27 — AB (ref 36–46)
Hemoglobin: 8.9 — AB (ref 12.0–16.0)
Neutrophils Absolute: 0.79
Platelets: 279 (ref 150–399)
WBC: 1.8

## 2020-05-19 LAB — COMPREHENSIVE METABOLIC PANEL: Calcium: 8.4 — AB (ref 8.7–10.7)

## 2020-05-19 NOTE — Telephone Encounter (Signed)
Pt states, "Dr Bobby Rumpf told me to come in and have my labs drawn if I feel weaker.  I'm not feeling well at all". Denies falls. She is having light headness. Dr Bobby Rumpf notified, agreed to lab draw.

## 2020-05-20 ENCOUNTER — Inpatient Hospital Stay: Payer: Medicare HMO

## 2020-05-21 ENCOUNTER — Inpatient Hospital Stay: Payer: Medicare HMO

## 2020-05-21 DIAGNOSIS — L89153 Pressure ulcer of sacral region, stage 3: Secondary | ICD-10-CM | POA: Diagnosis not present

## 2020-05-21 DIAGNOSIS — E039 Hypothyroidism, unspecified: Secondary | ICD-10-CM | POA: Diagnosis not present

## 2020-05-21 DIAGNOSIS — I872 Venous insufficiency (chronic) (peripheral): Secondary | ICD-10-CM | POA: Diagnosis not present

## 2020-05-21 DIAGNOSIS — F32A Depression, unspecified: Secondary | ICD-10-CM | POA: Diagnosis not present

## 2020-05-21 DIAGNOSIS — I4891 Unspecified atrial fibrillation: Secondary | ICD-10-CM | POA: Diagnosis not present

## 2020-05-21 DIAGNOSIS — T8189XD Other complications of procedures, not elsewhere classified, subsequent encounter: Secondary | ICD-10-CM | POA: Diagnosis not present

## 2020-05-21 DIAGNOSIS — I509 Heart failure, unspecified: Secondary | ICD-10-CM | POA: Diagnosis not present

## 2020-05-21 DIAGNOSIS — E1151 Type 2 diabetes mellitus with diabetic peripheral angiopathy without gangrene: Secondary | ICD-10-CM | POA: Diagnosis not present

## 2020-05-21 DIAGNOSIS — I110 Hypertensive heart disease with heart failure: Secondary | ICD-10-CM | POA: Diagnosis not present

## 2020-05-24 ENCOUNTER — Inpatient Hospital Stay: Payer: Medicare HMO

## 2020-05-24 DIAGNOSIS — I509 Heart failure, unspecified: Secondary | ICD-10-CM | POA: Diagnosis not present

## 2020-05-24 DIAGNOSIS — I110 Hypertensive heart disease with heart failure: Secondary | ICD-10-CM | POA: Diagnosis not present

## 2020-05-24 DIAGNOSIS — I4891 Unspecified atrial fibrillation: Secondary | ICD-10-CM | POA: Diagnosis not present

## 2020-05-24 DIAGNOSIS — F32A Depression, unspecified: Secondary | ICD-10-CM | POA: Diagnosis not present

## 2020-05-24 DIAGNOSIS — L89153 Pressure ulcer of sacral region, stage 3: Secondary | ICD-10-CM | POA: Diagnosis not present

## 2020-05-24 DIAGNOSIS — T8189XD Other complications of procedures, not elsewhere classified, subsequent encounter: Secondary | ICD-10-CM | POA: Diagnosis not present

## 2020-05-24 DIAGNOSIS — I872 Venous insufficiency (chronic) (peripheral): Secondary | ICD-10-CM | POA: Diagnosis not present

## 2020-05-24 DIAGNOSIS — E039 Hypothyroidism, unspecified: Secondary | ICD-10-CM | POA: Diagnosis not present

## 2020-05-24 DIAGNOSIS — E1151 Type 2 diabetes mellitus with diabetic peripheral angiopathy without gangrene: Secondary | ICD-10-CM | POA: Diagnosis not present

## 2020-05-25 ENCOUNTER — Inpatient Hospital Stay: Payer: Medicare HMO

## 2020-05-25 ENCOUNTER — Other Ambulatory Visit (HOSPITAL_COMMUNITY)
Admission: RE | Admit: 2020-05-25 | Discharge: 2020-05-25 | Disposition: A | Discharge status: Home / Self Care | Payer: Medicare HMO | Source: Ambulatory Visit | Attending: Oncology | Admitting: Oncology

## 2020-05-25 DIAGNOSIS — D46Z Other myelodysplastic syndromes: Secondary | ICD-10-CM | POA: Diagnosis not present

## 2020-05-25 DIAGNOSIS — Z7901 Long term (current) use of anticoagulants: Secondary | ICD-10-CM | POA: Diagnosis not present

## 2020-05-25 DIAGNOSIS — Z7982 Long term (current) use of aspirin: Secondary | ICD-10-CM | POA: Diagnosis not present

## 2020-05-25 DIAGNOSIS — D72819 Decreased white blood cell count, unspecified: Secondary | ICD-10-CM | POA: Diagnosis not present

## 2020-05-25 DIAGNOSIS — I4891 Unspecified atrial fibrillation: Secondary | ICD-10-CM | POA: Diagnosis not present

## 2020-05-25 DIAGNOSIS — D469 Myelodysplastic syndrome, unspecified: Secondary | ICD-10-CM | POA: Diagnosis not present

## 2020-05-25 DIAGNOSIS — E7800 Pure hypercholesterolemia, unspecified: Secondary | ICD-10-CM | POA: Diagnosis not present

## 2020-05-25 DIAGNOSIS — Z79899 Other long term (current) drug therapy: Secondary | ICD-10-CM | POA: Diagnosis not present

## 2020-05-25 DIAGNOSIS — D539 Nutritional anemia, unspecified: Secondary | ICD-10-CM | POA: Insufficient documentation

## 2020-05-25 DIAGNOSIS — D72822 Plasmacytosis: Secondary | ICD-10-CM | POA: Diagnosis not present

## 2020-05-25 DIAGNOSIS — I679 Cerebrovascular disease, unspecified: Secondary | ICD-10-CM | POA: Diagnosis not present

## 2020-05-26 ENCOUNTER — Ambulatory Visit: Payer: Medicare HMO

## 2020-05-26 ENCOUNTER — Telehealth: Payer: Self-pay | Admitting: Oncology

## 2020-05-26 DIAGNOSIS — F32A Depression, unspecified: Secondary | ICD-10-CM | POA: Diagnosis not present

## 2020-05-26 DIAGNOSIS — I872 Venous insufficiency (chronic) (peripheral): Secondary | ICD-10-CM | POA: Diagnosis not present

## 2020-05-26 DIAGNOSIS — T8189XD Other complications of procedures, not elsewhere classified, subsequent encounter: Secondary | ICD-10-CM | POA: Diagnosis not present

## 2020-05-26 DIAGNOSIS — L89153 Pressure ulcer of sacral region, stage 3: Secondary | ICD-10-CM | POA: Diagnosis not present

## 2020-05-26 DIAGNOSIS — I110 Hypertensive heart disease with heart failure: Secondary | ICD-10-CM | POA: Diagnosis not present

## 2020-05-26 DIAGNOSIS — I4891 Unspecified atrial fibrillation: Secondary | ICD-10-CM | POA: Diagnosis not present

## 2020-05-26 DIAGNOSIS — I509 Heart failure, unspecified: Secondary | ICD-10-CM | POA: Diagnosis not present

## 2020-05-26 DIAGNOSIS — E1151 Type 2 diabetes mellitus with diabetic peripheral angiopathy without gangrene: Secondary | ICD-10-CM | POA: Diagnosis not present

## 2020-05-26 DIAGNOSIS — E039 Hypothyroidism, unspecified: Secondary | ICD-10-CM | POA: Diagnosis not present

## 2020-05-26 NOTE — Telephone Encounter (Signed)
Patient had Bone Marrow Biopsy on 5/10 and requested Follow Up in a week.  Scheduled for 5/17 at 9:00 am

## 2020-05-27 ENCOUNTER — Ambulatory Visit: Payer: Medicare HMO

## 2020-05-27 DIAGNOSIS — I517 Cardiomegaly: Secondary | ICD-10-CM | POA: Diagnosis not present

## 2020-05-27 DIAGNOSIS — R1084 Generalized abdominal pain: Secondary | ICD-10-CM | POA: Diagnosis not present

## 2020-05-27 DIAGNOSIS — I872 Venous insufficiency (chronic) (peripheral): Secondary | ICD-10-CM | POA: Diagnosis not present

## 2020-05-27 DIAGNOSIS — R112 Nausea with vomiting, unspecified: Secondary | ICD-10-CM | POA: Diagnosis not present

## 2020-05-27 DIAGNOSIS — Z79899 Other long term (current) drug therapy: Secondary | ICD-10-CM | POA: Diagnosis not present

## 2020-05-27 DIAGNOSIS — I509 Heart failure, unspecified: Secondary | ICD-10-CM | POA: Diagnosis not present

## 2020-05-27 DIAGNOSIS — Z8673 Personal history of transient ischemic attack (TIA), and cerebral infarction without residual deficits: Secondary | ICD-10-CM | POA: Diagnosis not present

## 2020-05-27 DIAGNOSIS — Z950 Presence of cardiac pacemaker: Secondary | ICD-10-CM | POA: Diagnosis not present

## 2020-05-27 DIAGNOSIS — D649 Anemia, unspecified: Secondary | ICD-10-CM | POA: Diagnosis not present

## 2020-05-27 DIAGNOSIS — R110 Nausea: Secondary | ICD-10-CM | POA: Diagnosis not present

## 2020-05-27 DIAGNOSIS — I110 Hypertensive heart disease with heart failure: Secondary | ICD-10-CM | POA: Diagnosis not present

## 2020-05-27 DIAGNOSIS — E039 Hypothyroidism, unspecified: Secondary | ICD-10-CM | POA: Diagnosis not present

## 2020-05-27 DIAGNOSIS — K219 Gastro-esophageal reflux disease without esophagitis: Secondary | ICD-10-CM | POA: Diagnosis not present

## 2020-05-27 DIAGNOSIS — J90 Pleural effusion, not elsewhere classified: Secondary | ICD-10-CM | POA: Diagnosis not present

## 2020-05-27 DIAGNOSIS — I4891 Unspecified atrial fibrillation: Secondary | ICD-10-CM | POA: Diagnosis not present

## 2020-05-27 DIAGNOSIS — E119 Type 2 diabetes mellitus without complications: Secondary | ICD-10-CM | POA: Diagnosis not present

## 2020-05-27 DIAGNOSIS — E1151 Type 2 diabetes mellitus with diabetic peripheral angiopathy without gangrene: Secondary | ICD-10-CM | POA: Diagnosis not present

## 2020-05-27 DIAGNOSIS — R0602 Shortness of breath: Secondary | ICD-10-CM | POA: Diagnosis not present

## 2020-05-27 DIAGNOSIS — R1013 Epigastric pain: Secondary | ICD-10-CM | POA: Diagnosis not present

## 2020-05-27 DIAGNOSIS — R1110 Vomiting, unspecified: Secondary | ICD-10-CM | POA: Diagnosis not present

## 2020-05-27 DIAGNOSIS — Z7901 Long term (current) use of anticoagulants: Secondary | ICD-10-CM | POA: Diagnosis not present

## 2020-05-27 DIAGNOSIS — L89153 Pressure ulcer of sacral region, stage 3: Secondary | ICD-10-CM | POA: Diagnosis not present

## 2020-05-27 DIAGNOSIS — J189 Pneumonia, unspecified organism: Secondary | ICD-10-CM | POA: Diagnosis not present

## 2020-05-27 DIAGNOSIS — F32A Depression, unspecified: Secondary | ICD-10-CM | POA: Diagnosis not present

## 2020-05-27 DIAGNOSIS — R197 Diarrhea, unspecified: Secondary | ICD-10-CM | POA: Diagnosis not present

## 2020-05-27 DIAGNOSIS — T8189XD Other complications of procedures, not elsewhere classified, subsequent encounter: Secondary | ICD-10-CM | POA: Diagnosis not present

## 2020-05-27 DIAGNOSIS — R109 Unspecified abdominal pain: Secondary | ICD-10-CM | POA: Diagnosis not present

## 2020-05-27 LAB — SURGICAL PATHOLOGY

## 2020-05-28 ENCOUNTER — Ambulatory Visit: Payer: Medicare HMO

## 2020-05-28 DIAGNOSIS — I509 Heart failure, unspecified: Secondary | ICD-10-CM | POA: Diagnosis not present

## 2020-05-28 DIAGNOSIS — I872 Venous insufficiency (chronic) (peripheral): Secondary | ICD-10-CM | POA: Diagnosis not present

## 2020-05-28 DIAGNOSIS — L89153 Pressure ulcer of sacral region, stage 3: Secondary | ICD-10-CM | POA: Diagnosis not present

## 2020-05-28 DIAGNOSIS — F32A Depression, unspecified: Secondary | ICD-10-CM | POA: Diagnosis not present

## 2020-05-28 DIAGNOSIS — E039 Hypothyroidism, unspecified: Secondary | ICD-10-CM | POA: Diagnosis not present

## 2020-05-28 DIAGNOSIS — T8189XD Other complications of procedures, not elsewhere classified, subsequent encounter: Secondary | ICD-10-CM | POA: Diagnosis not present

## 2020-05-28 DIAGNOSIS — I4891 Unspecified atrial fibrillation: Secondary | ICD-10-CM | POA: Diagnosis not present

## 2020-05-28 DIAGNOSIS — E1151 Type 2 diabetes mellitus with diabetic peripheral angiopathy without gangrene: Secondary | ICD-10-CM | POA: Diagnosis not present

## 2020-05-28 DIAGNOSIS — I110 Hypertensive heart disease with heart failure: Secondary | ICD-10-CM | POA: Diagnosis not present

## 2020-05-28 NOTE — Progress Notes (Signed)
[facility]  [address] [place],  [place]  [address] [phone]  Clinic Day:  06/01/2020  Referring physician: Ernestene Kiel, MD  This document serves as a record of services personally performed by Laura Potter, MD. It was created on their behalf by [facility] E, a trained medical scribe. The creation of this record is based on the scribe's personal observations and the provider's statements to them.  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  Due to a decline in her peripheral counts, a bone marrow biopsy was done to ensure she had not transformed into acute leukemia.  She comes in today to review her bone marrow biopsy results.  The patient has received 6 cycles of azacitidine thus far.  With respect to her myelodysplasia, the patient still has baseline weakness.  Her major complaint recently has been band-like abdominal pain.  Of note, a CT scan of her abdomen/pelvis done in the emergency room last week did not show any abnormality.  She also had both her Carafate and proton pump inhibitor doubled yesterday by her primary care provider.  A referral was also placed to GI for them to evaluate her symptoms.  PHYSICAL EXAM:  Blood pressure (!) 217/91, pulse 70, temperature 98 F (36.7 C), resp. rate 14, height _0  (1.549 m), weight 194 lb 14.4 oz (88.4 kg), SpO2 95 %. Wt Readings from Last 3 Encounters:  06/01/20 194 lb 14.4 oz (88.4 kg)  05/12/20 206 lb 4.8 oz (93.6 kg)  04/27/20 206 lb (93.4 kg)   Body mass index is 36.83 kg/m. Performance status (ECOG): 1 Physical Exam Constitutional:      Appearance: Normal appearance. She is not ill-appearing.  HENT:     Mouth/Throat:     Mouth: Mucous membranes are moist.     Pharynx: Oropharynx is clear. No oropharyngeal exudate or posterior oropharyngeal erythema.  Cardiovascular:     Rate and Rhythm: Normal rate and regular rhythm.      Heart sounds: No murmur heard. No friction rub. No gallop.   Pulmonary:     Effort: Pulmonary effort is normal. No respiratory distress.     Breath sounds: Normal breath sounds. No wheezing, rhonchi or rales.  Chest:  Breasts:     Right: No axillary adenopathy or supraclavicular adenopathy.     Left: No axillary adenopathy or supraclavicular adenopathy.    Abdominal:     General: Bowel sounds are normal. There is no distension.     Palpations: Abdomen is soft. There is no mass.     Tenderness: There is no abdominal tenderness.  Musculoskeletal:        General: No swelling.     Right lower leg: No edema.     Left lower leg: No edema.  Lymphadenopathy:     Cervical: No cervical adenopathy.     Upper Body:     Right upper body: No supraclavicular or axillary adenopathy.     Left upper body: No supraclavicular or axillary adenopathy.     Lower Body: No right inguinal adenopathy. No left inguinal adenopathy.  Skin:    General: Skin is warm.     Coloration: Skin is not jaundiced.     Findings: Bruising present. No lesion or rash.  Neurological:     General: No focal deficit present.     Mental Status: She is alert and oriented to person, place, and time. Mental status is at  baseline.     Cranial Nerves: Cranial nerves are intact.  Psychiatric:        Mood and Affect: Mood normal.        Behavior: Behavior normal.        Thought Content: Thought content normal.    LABS:   CBC Latest Ref Rng & Units 06/01/2020 05/19/2020 05/12/2020  WBC - 3.5 1.8 0.6  Hemoglobin 12.0 - 16.0 9.5(A) 8.9(A) 8.7(A)  Hematocrit 36 - 46 30(A) 27(A) 27(A)  Platelets 150 - 399 265 279 257   CMP Latest Ref Rng & Units 05/19/2020 05/12/2020 04/14/2020  Glucose 65 - 99 mg/dL - - -  BUN 4 - _0 Creatinine 0.5 - 1.1 0.8 0.9 0.8  Sodium 137 - 147 137 135(A) 139  Potassium 3.4 - 5.3 4.0 3.7 3.8  Chloride 99 - 108 99 101 103  CO2 13 - 22 29(A) 29(A) 28(A)  Calcium 8.7 - 10.7 8.4(A) 8.5(A) 8.6(A)   Alkaline Phos 25 - 125 - - -  AST 13 - 35 - - -  ALT 7 - 35 - - -   Bone Marrow Biopsy Results: 05/25/2020  DIAGNOSIS:   BONE MARROW, ASPIRATE, CLOT, CORE:  -Slightly hypercellular bone marrow for age with dyspoietic changes  -Polyclonal plasmacytosis (plasma cells 8%)  -See comment   PERIPHERAL BLOOD:  -Macrocytic anemia  -Leukopenia   COMMENT:   The bone marrow is slightly hypercellular for age with trilineage  hematopoiesis but with relative abundance of erythroid precursors.  There are mild dyspoietic changes primarily involving the erythroid cell  line. Definite increase in blastic cells or ring sideroblasts are not  seen. The changes likely represent residual known myelodysplasia and/or  treatment effect. The plasma cells are slightly increased in number  (8%) with polyclonal staining pattern for kappa and lambda light chains  most consistent with a reactive process. Correlation with cytogenetic  and FISH studies is recommended.   MICROSCOPIC DESCRIPTION:   PERIPHERAL BLOOD SMEAR: The red blood cells display moderate  anisocytosis with macrocytic and normocytic cells. There is mild  poikilocytosis with elliptocytes, teardrop cells. There is mild to  moderate polychromasia. The white blood cells are decreased in number,  mostly with neutrophils many of which display mild toxic granulation. A  rare myelocyte is seen on scan. The platelets are normal in number   BONE MARROW ASPIRATE: Bone marrow particles present  Erythroid precursors: Relatively abundant with progressive but slightly  left shifted maturation and increased number of early precursors.  Maturing erythroid precursors display dyserythropoiesis in the form of  nuclear cytoplasmic dyssynchrony, and irregular/lobulated nuclei  Granulocytic precursors: Progressive maturation with many mature  neutrophils displaying mild toxic granulation. Definite increase in  blastic cells is not seen.   Megakaryocytes: Abundant with scattered small hypolobated forms or large  hyperchromatic cells  Lymphocytes/plasma cells: The plasma cells are relatively abundant  representing 8% of all cells with lack of large aggregates or sheets.  Large lymphoid aggregates are not seen.  Other: Mast cells are conspicuous   TOUCH PREPARATIONS: A mixture of cell-types present   CLOT AND BIOPSY: The sections show 20 to 40% cellularity with a mixture  of myeloid cell types. There is conspicuous presence of immature  mononuclear cells likely correlating with early erythroid precursors  seen in the aspirate. Large clusters or sheets of plasma cells are not  identified, and significant lymphoid aggregates are not seen.  Immunohistochemical stains for CD138, CD34, CD117, myeloperoxidase,  E-cadherin, TdT  in addition to in situ hybridization for kappa and  lambda were performed on block B1 with appropriate controls. CD34 only  highlights scattering of positive mononuclear cells with no significant  clusters. CD117 shows weak positivity in immature mononuclear cells in  the form of interstitial cells and small clusters representing early  erythroid and/or granulocytic precursors. There is also strong CD117  positivity throughout in the form of interstitial cells likely  representing plasma cells and/or mast cells. Myeloperoxidase highlights  the granulocytic component and E-cadherin highlights the abundant  erythroid component consisting of numerous predominantly small clusters  including positivity in clusters of immature mononuclear cells. No  significant TdT positivity is identified. CD138 highlights and  increased plasma cell component consisting of interstitial cells and  small clusters and shows polyclonal staining pattern for kappa and  lambda light chains   IRON STAIN: Iron stains are performed on a bone marrow aspirate or touch  imprint smear and section of clot. The controls stained  appropriately.     Storage Iron: Increased    Ring Sideroblasts: Absent   ADDITIONAL DATA/TESTING: The specimen was sent for cytogenetic analysis  and FISH for MDS and a separate report will follow. Flow cytometric analysis was performed [facility] [id number]) and failed to show any significant CD34 positive blastic population, monoclonal B-cell population, or  abnormal T cell phenotype.   CELL COUNT DATA:   Bone Marrow count performed on 500 cells shows:  Blasts:  2%  Myeloid: 28%  Promyelocytes: 1%  Erythroid:   50%  Myelocytes:  9%  Lymphocytes:  7%  Metamyelocytes:   3%  Plasma cells: 8%  Bands:  2%  Neutrophils:  10% M:E ratio:   0.56  Eosinophils:  1%  Basophils:   0%  Monocytes:   7%   Lab Data: CBC performed on 05/26/20 shows:  WBC: 2.9 k/uL Neutrophils:  54%  Hgb: 8.8 g/dL Lymphocytes:  31%  HCT: 26.7 %  Monocytes:   14%  MCV: 113 fL  Eosinophils:  0%  RDW: 20.4 %  Basophils:   1%  PLT: 249 k/uL   Flow Cytometry Report:  DIAGNOSIS:   -No significant CD34 positive blastic population identified  -No monoclonal B cell population or abnormal T cell phenotype  identified.    GATING AND PHENOTYPIC ANALYSIS:   Gated population: Flow cytometric immunophenotyping is performed using  antibodies to the antigens listed in the table below. Electronic gates  are placed around a cell cluster displaying light scatter properties  corresponding to: lymphocytes and blasts   Abnormal Cells in gated population: N/A   Phenotype of Abnormal Cells: N/A            Lymphoid Antigens    Myeloid Antigens  Miscellaneous  CD2 tested  CD10 tested  CD11b   tested  CD45 tested  CD3 tested  CD19 tested  CD11c   ND  HLA-Dr  tested  CD4 tested  CD20 tested  CD13 tested  CD34 tested  CD5 tested  CD22 ND  CD14 tested  CD38 tested  CD7 tested  CD79b   ND CD15 tested  CD138   ND  CD8 tested  CD103    ND  CD16 tested  TdT ND  CD25 ND  CD200   tested  CD33 tested  CD123   tested  TCRab   ND  sKappa  tested  CD64 tested  CD41 ND  TCRgd   tested  sLambda  tested  CD117   tested  CD61 ND  CD56 tested  cKappa  ND  MPO ND  CD71 ND  CD57 ND  cLambda  ND       CD235a  ND   ASSESSMENT & PLAN:  Assessment/Plan:  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  In clinic today, I went over her bone marrow results with her for which she was understandably pleased as the blast component of her myelodysplasia has been eradicated from her 6 cycles of azacitidine.   Furthermore, her peripheral counts appear to be improving.  The patient is somewhat hesitant to proceed with her 7th cycle of azacitidine due to her GI problems.  I will give her an additional 2 weeks for her to get her GI problems corrected before proceeding with her next cycle of treatment.  I will see her back in 2 weeks for repeat clinical assessment.  The patient understands all the plans discussed today and is in agreement with them.     I, Rita Ohara, am acting as scribe for Laura Potter, MD    I have reviewed this report as typed by the medical scribe, and it is complete and accurate.  Laura Macarthur Critchley, MD

## 2020-05-31 ENCOUNTER — Other Ambulatory Visit: Payer: Self-pay | Admitting: Oncology

## 2020-05-31 DIAGNOSIS — E039 Hypothyroidism, unspecified: Secondary | ICD-10-CM | POA: Diagnosis not present

## 2020-05-31 DIAGNOSIS — I110 Hypertensive heart disease with heart failure: Secondary | ICD-10-CM | POA: Diagnosis not present

## 2020-05-31 DIAGNOSIS — I4891 Unspecified atrial fibrillation: Secondary | ICD-10-CM | POA: Diagnosis not present

## 2020-05-31 DIAGNOSIS — I509 Heart failure, unspecified: Secondary | ICD-10-CM | POA: Diagnosis not present

## 2020-05-31 DIAGNOSIS — R1084 Generalized abdominal pain: Secondary | ICD-10-CM | POA: Diagnosis not present

## 2020-05-31 DIAGNOSIS — T8189XD Other complications of procedures, not elsewhere classified, subsequent encounter: Secondary | ICD-10-CM | POA: Diagnosis not present

## 2020-05-31 DIAGNOSIS — D46Z Other myelodysplastic syndromes: Secondary | ICD-10-CM

## 2020-05-31 DIAGNOSIS — I872 Venous insufficiency (chronic) (peripheral): Secondary | ICD-10-CM | POA: Diagnosis not present

## 2020-05-31 DIAGNOSIS — F32A Depression, unspecified: Secondary | ICD-10-CM | POA: Diagnosis not present

## 2020-05-31 DIAGNOSIS — L89153 Pressure ulcer of sacral region, stage 3: Secondary | ICD-10-CM | POA: Diagnosis not present

## 2020-05-31 DIAGNOSIS — Z6837 Body mass index (BMI) 37.0-37.9, adult: Secondary | ICD-10-CM | POA: Diagnosis not present

## 2020-05-31 DIAGNOSIS — E1151 Type 2 diabetes mellitus with diabetic peripheral angiopathy without gangrene: Secondary | ICD-10-CM | POA: Diagnosis not present

## 2020-06-01 ENCOUNTER — Inpatient Hospital Stay: Payer: Medicare HMO

## 2020-06-01 ENCOUNTER — Telehealth: Payer: Self-pay | Admitting: Oncology

## 2020-06-01 ENCOUNTER — Other Ambulatory Visit: Payer: Self-pay

## 2020-06-01 ENCOUNTER — Other Ambulatory Visit: Payer: Self-pay | Admitting: Hematology and Oncology

## 2020-06-01 ENCOUNTER — Other Ambulatory Visit: Payer: Self-pay | Admitting: Oncology

## 2020-06-01 ENCOUNTER — Inpatient Hospital Stay: Payer: Medicare HMO | Admitting: Oncology

## 2020-06-01 VITALS — BP 217/91 | HR 70 | Temp 98.0°F | Resp 14 | Ht 61.0 in | Wt 194.9 lb

## 2020-06-01 DIAGNOSIS — D46Z Other myelodysplastic syndromes: Secondary | ICD-10-CM

## 2020-06-01 DIAGNOSIS — D649 Anemia, unspecified: Secondary | ICD-10-CM | POA: Diagnosis not present

## 2020-06-01 LAB — CBC AND DIFFERENTIAL
HCT: 30 — AB (ref 36–46)
Hemoglobin: 9.5 — AB (ref 12.0–16.0)
Neutrophils Absolute: 2.38
Platelets: 265 (ref 150–399)
WBC: 3.5

## 2020-06-01 LAB — CBC
MCV: 114 — AB (ref 81–99)
RBC: 2.62 — AB (ref 3.87–5.11)

## 2020-06-01 NOTE — Telephone Encounter (Signed)
Per 5/17 los next appt scheduled and given to patient 

## 2020-06-02 ENCOUNTER — Other Ambulatory Visit: Payer: Self-pay

## 2020-06-02 ENCOUNTER — Encounter: Payer: Self-pay | Admitting: Oncology

## 2020-06-02 DIAGNOSIS — E1151 Type 2 diabetes mellitus with diabetic peripheral angiopathy without gangrene: Secondary | ICD-10-CM | POA: Diagnosis not present

## 2020-06-02 DIAGNOSIS — T8189XD Other complications of procedures, not elsewhere classified, subsequent encounter: Secondary | ICD-10-CM | POA: Diagnosis not present

## 2020-06-02 DIAGNOSIS — I872 Venous insufficiency (chronic) (peripheral): Secondary | ICD-10-CM | POA: Diagnosis not present

## 2020-06-02 DIAGNOSIS — R1084 Generalized abdominal pain: Secondary | ICD-10-CM | POA: Diagnosis not present

## 2020-06-02 DIAGNOSIS — I4891 Unspecified atrial fibrillation: Secondary | ICD-10-CM | POA: Diagnosis not present

## 2020-06-02 DIAGNOSIS — E039 Hypothyroidism, unspecified: Secondary | ICD-10-CM | POA: Diagnosis not present

## 2020-06-02 DIAGNOSIS — L89153 Pressure ulcer of sacral region, stage 3: Secondary | ICD-10-CM | POA: Diagnosis not present

## 2020-06-02 DIAGNOSIS — I110 Hypertensive heart disease with heart failure: Secondary | ICD-10-CM | POA: Diagnosis not present

## 2020-06-02 DIAGNOSIS — I509 Heart failure, unspecified: Secondary | ICD-10-CM | POA: Diagnosis not present

## 2020-06-02 DIAGNOSIS — F32A Depression, unspecified: Secondary | ICD-10-CM | POA: Diagnosis not present

## 2020-06-03 ENCOUNTER — Encounter: Payer: Self-pay | Admitting: Oncology

## 2020-06-03 DIAGNOSIS — R1013 Epigastric pain: Secondary | ICD-10-CM | POA: Diagnosis not present

## 2020-06-04 ENCOUNTER — Encounter (HOSPITAL_COMMUNITY): Payer: Self-pay | Admitting: Oncology

## 2020-06-04 DIAGNOSIS — I509 Heart failure, unspecified: Secondary | ICD-10-CM | POA: Diagnosis not present

## 2020-06-04 DIAGNOSIS — E039 Hypothyroidism, unspecified: Secondary | ICD-10-CM | POA: Diagnosis not present

## 2020-06-04 DIAGNOSIS — F32A Depression, unspecified: Secondary | ICD-10-CM | POA: Diagnosis not present

## 2020-06-04 DIAGNOSIS — I110 Hypertensive heart disease with heart failure: Secondary | ICD-10-CM | POA: Diagnosis not present

## 2020-06-04 DIAGNOSIS — E1151 Type 2 diabetes mellitus with diabetic peripheral angiopathy without gangrene: Secondary | ICD-10-CM | POA: Diagnosis not present

## 2020-06-04 DIAGNOSIS — I872 Venous insufficiency (chronic) (peripheral): Secondary | ICD-10-CM | POA: Diagnosis not present

## 2020-06-04 DIAGNOSIS — I4891 Unspecified atrial fibrillation: Secondary | ICD-10-CM | POA: Diagnosis not present

## 2020-06-04 DIAGNOSIS — L89153 Pressure ulcer of sacral region, stage 3: Secondary | ICD-10-CM | POA: Diagnosis not present

## 2020-06-04 DIAGNOSIS — T8189XD Other complications of procedures, not elsewhere classified, subsequent encounter: Secondary | ICD-10-CM | POA: Diagnosis not present

## 2020-06-07 DIAGNOSIS — F32A Depression, unspecified: Secondary | ICD-10-CM | POA: Diagnosis not present

## 2020-06-07 DIAGNOSIS — I110 Hypertensive heart disease with heart failure: Secondary | ICD-10-CM | POA: Diagnosis not present

## 2020-06-07 DIAGNOSIS — I509 Heart failure, unspecified: Secondary | ICD-10-CM | POA: Diagnosis not present

## 2020-06-07 DIAGNOSIS — I872 Venous insufficiency (chronic) (peripheral): Secondary | ICD-10-CM | POA: Diagnosis not present

## 2020-06-07 DIAGNOSIS — I4891 Unspecified atrial fibrillation: Secondary | ICD-10-CM | POA: Diagnosis not present

## 2020-06-07 DIAGNOSIS — L89153 Pressure ulcer of sacral region, stage 3: Secondary | ICD-10-CM | POA: Diagnosis not present

## 2020-06-07 DIAGNOSIS — E1151 Type 2 diabetes mellitus with diabetic peripheral angiopathy without gangrene: Secondary | ICD-10-CM | POA: Diagnosis not present

## 2020-06-07 DIAGNOSIS — E039 Hypothyroidism, unspecified: Secondary | ICD-10-CM | POA: Diagnosis not present

## 2020-06-07 DIAGNOSIS — T8189XD Other complications of procedures, not elsewhere classified, subsequent encounter: Secondary | ICD-10-CM | POA: Diagnosis not present

## 2020-06-08 DIAGNOSIS — Z7901 Long term (current) use of anticoagulants: Secondary | ICD-10-CM | POA: Diagnosis not present

## 2020-06-09 ENCOUNTER — Inpatient Hospital Stay: Payer: Medicare HMO | Admitting: Oncology

## 2020-06-09 ENCOUNTER — Inpatient Hospital Stay: Payer: Medicare HMO

## 2020-06-09 DIAGNOSIS — I4891 Unspecified atrial fibrillation: Secondary | ICD-10-CM | POA: Diagnosis not present

## 2020-06-09 DIAGNOSIS — E1151 Type 2 diabetes mellitus with diabetic peripheral angiopathy without gangrene: Secondary | ICD-10-CM | POA: Diagnosis not present

## 2020-06-09 DIAGNOSIS — I872 Venous insufficiency (chronic) (peripheral): Secondary | ICD-10-CM | POA: Diagnosis not present

## 2020-06-09 DIAGNOSIS — I509 Heart failure, unspecified: Secondary | ICD-10-CM | POA: Diagnosis not present

## 2020-06-09 DIAGNOSIS — F32A Depression, unspecified: Secondary | ICD-10-CM | POA: Diagnosis not present

## 2020-06-09 DIAGNOSIS — I110 Hypertensive heart disease with heart failure: Secondary | ICD-10-CM | POA: Diagnosis not present

## 2020-06-09 DIAGNOSIS — L89153 Pressure ulcer of sacral region, stage 3: Secondary | ICD-10-CM | POA: Diagnosis not present

## 2020-06-09 DIAGNOSIS — T8189XD Other complications of procedures, not elsewhere classified, subsequent encounter: Secondary | ICD-10-CM | POA: Diagnosis not present

## 2020-06-09 DIAGNOSIS — E039 Hypothyroidism, unspecified: Secondary | ICD-10-CM | POA: Diagnosis not present

## 2020-06-09 DIAGNOSIS — R1013 Epigastric pain: Secondary | ICD-10-CM | POA: Diagnosis not present

## 2020-06-10 DIAGNOSIS — I509 Heart failure, unspecified: Secondary | ICD-10-CM | POA: Diagnosis not present

## 2020-06-10 DIAGNOSIS — I872 Venous insufficiency (chronic) (peripheral): Secondary | ICD-10-CM | POA: Diagnosis not present

## 2020-06-10 DIAGNOSIS — I110 Hypertensive heart disease with heart failure: Secondary | ICD-10-CM | POA: Diagnosis not present

## 2020-06-10 DIAGNOSIS — Z7984 Long term (current) use of oral hypoglycemic drugs: Secondary | ICD-10-CM | POA: Diagnosis not present

## 2020-06-10 DIAGNOSIS — Z7901 Long term (current) use of anticoagulants: Secondary | ICD-10-CM | POA: Diagnosis not present

## 2020-06-10 DIAGNOSIS — I4891 Unspecified atrial fibrillation: Secondary | ICD-10-CM | POA: Diagnosis not present

## 2020-06-10 DIAGNOSIS — E1151 Type 2 diabetes mellitus with diabetic peripheral angiopathy without gangrene: Secondary | ICD-10-CM | POA: Diagnosis not present

## 2020-06-10 DIAGNOSIS — M6281 Muscle weakness (generalized): Secondary | ICD-10-CM | POA: Diagnosis not present

## 2020-06-10 DIAGNOSIS — L89153 Pressure ulcer of sacral region, stage 3: Secondary | ICD-10-CM | POA: Diagnosis not present

## 2020-06-10 NOTE — Progress Notes (Signed)
Called to speak with patient about setting her up with [facility] for her upcoming treatments. She wants to wait until after she sees Dr. Bobby Rumpf on the 31st to decide if she wants to continue with treatment right now. I told her I would speak with her after the 31st to see if she wants me to schedule transportation or not.

## 2020-06-11 DIAGNOSIS — F32A Depression, unspecified: Secondary | ICD-10-CM | POA: Diagnosis not present

## 2020-06-11 DIAGNOSIS — E039 Hypothyroidism, unspecified: Secondary | ICD-10-CM | POA: Diagnosis not present

## 2020-06-11 DIAGNOSIS — I4891 Unspecified atrial fibrillation: Secondary | ICD-10-CM | POA: Diagnosis not present

## 2020-06-11 DIAGNOSIS — T8189XD Other complications of procedures, not elsewhere classified, subsequent encounter: Secondary | ICD-10-CM | POA: Diagnosis not present

## 2020-06-11 DIAGNOSIS — I110 Hypertensive heart disease with heart failure: Secondary | ICD-10-CM | POA: Diagnosis not present

## 2020-06-11 DIAGNOSIS — I872 Venous insufficiency (chronic) (peripheral): Secondary | ICD-10-CM | POA: Diagnosis not present

## 2020-06-11 DIAGNOSIS — E1151 Type 2 diabetes mellitus with diabetic peripheral angiopathy without gangrene: Secondary | ICD-10-CM | POA: Diagnosis not present

## 2020-06-11 DIAGNOSIS — I509 Heart failure, unspecified: Secondary | ICD-10-CM | POA: Diagnosis not present

## 2020-06-11 DIAGNOSIS — L89153 Pressure ulcer of sacral region, stage 3: Secondary | ICD-10-CM | POA: Diagnosis not present

## 2020-06-11 NOTE — Progress Notes (Signed)
[facility]  [address] [place],  [place]  [address] [phone]  Clinic Day:  06/15/2020  Referring physician: Ernestene Kiel, MD  This document serves as a record of services personally performed by Marice Potter, MD. It was created on their behalf by [facility] E, a trained medical scribe. The creation of this record is based on the scribe's personal observations and the provider's statements to them.  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  A bone marrow biopsy after 6 cycles of azacitidine fortunately showed that her leukemia cells had been eradicated.  She was scheduled for her 7th cycles of treatment 2 weeks ago, but this was held due to her having significant abdominal pain.  She comes in today to be reassessed.  The patient claims her abdominal pain persists, but is a little better.  She had a GI workup for which no abnormal pathology was seen.    Recent CT scans of her abdomen/pelvis also did not show any abnormality.  She was told that her azacitidine therapy may be the reason behind her abdominal discomfort.  Of note, this patient did not have any abdominal pain until after she completed her 6th cycle of treatment.  PHYSICAL EXAM:  Blood pressure (!) 204/86, pulse 76, temperature 98.5 F (36.9 C), resp. rate 16, height $RemoveBe'5\' 1"'caxGVdrzV$  (1.549 m), weight 184 lb 3.2 oz (83.6 kg), SpO2 96 %. Wt Readings from Last 3 Encounters:  06/15/20 184 lb 3.2 oz (83.6 kg)  06/01/20 194 lb 14.4 oz (88.4 kg)  05/12/20 206 lb 4.8 oz (93.6 kg)   Body mass index is 34.8 kg/m. Performance status (ECOG): 1 Physical Exam Constitutional:      Appearance: Normal appearance. She is not ill-appearing.  HENT:     Mouth/Throat:     Mouth: Mucous membranes are moist.     Pharynx: Oropharynx is clear. No oropharyngeal exudate or posterior oropharyngeal erythema.  Cardiovascular:     Rate and Rhythm: Normal  rate and regular rhythm.     Heart sounds: No murmur heard. No friction rub. No gallop.   Pulmonary:     Effort: Pulmonary effort is normal. No respiratory distress.     Breath sounds: Normal breath sounds. No wheezing, rhonchi or rales.  Chest:  Breasts:     Right: No axillary adenopathy or supraclavicular adenopathy.     Left: No axillary adenopathy or supraclavicular adenopathy.    Abdominal:     General: Bowel sounds are normal. There is no distension.     Palpations: Abdomen is soft. There is no mass.     Tenderness: There is no abdominal tenderness.  Musculoskeletal:        General: No swelling.     Right lower leg: No edema.     Left lower leg: No edema.  Lymphadenopathy:     Cervical: No cervical adenopathy.     Upper Body:     Right upper body: No supraclavicular or axillary adenopathy.     Left upper body: No supraclavicular or axillary adenopathy.     Lower Body: No right inguinal adenopathy. No left inguinal adenopathy.  Skin:    General: Skin is warm.     Coloration: Skin is not jaundiced.     Findings: Bruising present. No lesion or rash.  Neurological:     General: No focal deficit present.     Mental Status: She is alert and oriented to  person, place, and time. Mental status is at baseline.     Cranial Nerves: Cranial nerves are intact.  Psychiatric:        Mood and Affect: Mood normal.        Behavior: Behavior normal.        Thought Content: Thought content normal.    LABS:   CBC Latest Ref Rng & Units 06/15/2020 06/01/2020 05/19/2020  WBC - 4.5 3.5 1.8  Hemoglobin 12.0 - 16.0 10.8(A) 9.5(A) 8.9(A)  Hematocrit 36 - 46 32(A) 30(A) 27(A)  Platelets 150 - 399 220 265 279   CMP Latest Ref Rng & Units 06/15/2020 05/19/2020 05/12/2020  Glucose 65 - 99 mg/dL - - -  BUN 4 - 21 30(A) 21 17  Creatinine 0.5 - 1.1 1.3(A) 0.8 0.9  Sodium 137 - 147 136(A) 137 135(A)  Potassium 3.4 - 5.3 4.0 4.0 3.7  Chloride 99 - 108 98(A) 99 101  CO2 13 - 22 27(A) 29(A) 29(A)   Calcium 8.7 - 10.7 9.2 8.4(A) 8.5(A)  Alkaline Phos 25 - 125 188(A) - -  AST 13 - 35 29 - -  ALT 7 - 35 16 - -    ASSESSMENT & PLAN:  Assessment/Plan:  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  I am pleased as her labs today show that her hemoglobin is now above 10.  As she still has some degree of abdominal pain, she wishes to defer her 7th cycle of azacitidine for another 2 weeks, for which I will oblige.  Although her recent bone marrow was clear of leukemia cells and her hemoglobin has improved, I made sure the patient understood that stopping treatment altogether would likely lead to her bone marrow degenerating again, with transformation into AML likely fairly imminent.  I will see her back in 2 weeks for repeat clinical assessment.  The patient understands all the plans discussed today and is in agreement with them.    I, Rita Ohara, am acting as scribe for Marice Potter, MD    I have reviewed this report as typed by the medical scribe, and it is complete and accurate.  Dequincy Macarthur Critchley, MD

## 2020-06-14 ENCOUNTER — Other Ambulatory Visit: Payer: Self-pay | Admitting: Oncology

## 2020-06-14 DIAGNOSIS — I509 Heart failure, unspecified: Secondary | ICD-10-CM | POA: Diagnosis not present

## 2020-06-14 DIAGNOSIS — E039 Hypothyroidism, unspecified: Secondary | ICD-10-CM | POA: Diagnosis not present

## 2020-06-14 DIAGNOSIS — L89153 Pressure ulcer of sacral region, stage 3: Secondary | ICD-10-CM | POA: Diagnosis not present

## 2020-06-14 DIAGNOSIS — I872 Venous insufficiency (chronic) (peripheral): Secondary | ICD-10-CM | POA: Diagnosis not present

## 2020-06-14 DIAGNOSIS — I4891 Unspecified atrial fibrillation: Secondary | ICD-10-CM | POA: Diagnosis not present

## 2020-06-14 DIAGNOSIS — T8189XD Other complications of procedures, not elsewhere classified, subsequent encounter: Secondary | ICD-10-CM | POA: Diagnosis not present

## 2020-06-14 DIAGNOSIS — E1151 Type 2 diabetes mellitus with diabetic peripheral angiopathy without gangrene: Secondary | ICD-10-CM | POA: Diagnosis not present

## 2020-06-14 DIAGNOSIS — I110 Hypertensive heart disease with heart failure: Secondary | ICD-10-CM | POA: Diagnosis not present

## 2020-06-14 DIAGNOSIS — F32A Depression, unspecified: Secondary | ICD-10-CM | POA: Diagnosis not present

## 2020-06-14 DIAGNOSIS — D46Z Other myelodysplastic syndromes: Secondary | ICD-10-CM

## 2020-06-15 ENCOUNTER — Inpatient Hospital Stay: Payer: Medicare HMO

## 2020-06-15 ENCOUNTER — Encounter: Payer: Self-pay | Admitting: Oncology

## 2020-06-15 ENCOUNTER — Other Ambulatory Visit: Payer: Self-pay

## 2020-06-15 ENCOUNTER — Other Ambulatory Visit: Payer: Self-pay | Admitting: Oncology

## 2020-06-15 ENCOUNTER — Telehealth: Payer: Self-pay | Admitting: Oncology

## 2020-06-15 ENCOUNTER — Inpatient Hospital Stay (INDEPENDENT_AMBULATORY_CARE_PROVIDER_SITE_OTHER): Payer: Medicare HMO | Admitting: Oncology

## 2020-06-15 VITALS — BP 204/86 | HR 76 | Temp 98.5°F | Resp 16 | Ht 61.0 in | Wt 184.2 lb

## 2020-06-15 DIAGNOSIS — R1010 Upper abdominal pain, unspecified: Secondary | ICD-10-CM | POA: Diagnosis not present

## 2020-06-15 DIAGNOSIS — D46Z Other myelodysplastic syndromes: Secondary | ICD-10-CM | POA: Diagnosis not present

## 2020-06-15 DIAGNOSIS — I110 Hypertensive heart disease with heart failure: Secondary | ICD-10-CM | POA: Diagnosis not present

## 2020-06-15 DIAGNOSIS — E785 Hyperlipidemia, unspecified: Secondary | ICD-10-CM | POA: Diagnosis not present

## 2020-06-15 DIAGNOSIS — I509 Heart failure, unspecified: Secondary | ICD-10-CM | POA: Diagnosis not present

## 2020-06-15 DIAGNOSIS — D649 Anemia, unspecified: Secondary | ICD-10-CM | POA: Diagnosis not present

## 2020-06-15 DIAGNOSIS — Z6835 Body mass index (BMI) 35.0-35.9, adult: Secondary | ICD-10-CM | POA: Diagnosis not present

## 2020-06-15 LAB — CBC AND DIFFERENTIAL
HCT: 32 — AB (ref 36–46)
Hemoglobin: 10.8 — AB (ref 12.0–16.0)
Neutrophils Absolute: 3.11
Platelets: 220 (ref 150–399)
WBC: 4.5

## 2020-06-15 LAB — BASIC METABOLIC PANEL
BUN: 30 — AB (ref 4–21)
CO2: 27 — AB (ref 13–22)
Chloride: 98 — AB (ref 99–108)
Creatinine: 1.3 — AB (ref 0.5–1.1)
Glucose: 121
Potassium: 4 (ref 3.4–5.3)
Sodium: 136 — AB (ref 137–147)

## 2020-06-15 LAB — HEPATIC FUNCTION PANEL
ALT: 16 (ref 7–35)
AST: 29 (ref 13–35)
Alkaline Phosphatase: 188 — AB (ref 25–125)
Bilirubin, Total: 0.9

## 2020-06-15 LAB — CBC: RBC: 2.83 — AB (ref 3.87–5.11)

## 2020-06-15 LAB — COMPREHENSIVE METABOLIC PANEL
Albumin: 4.4 (ref 3.5–5.0)
Calcium: 9.2 (ref 8.7–10.7)

## 2020-06-15 NOTE — Telephone Encounter (Signed)
Per 5/31 los next appt scheduled and given to patient 

## 2020-06-17 ENCOUNTER — Inpatient Hospital Stay: Payer: Medicare HMO

## 2020-06-17 LAB — SURGICAL PATHOLOGY

## 2020-06-18 ENCOUNTER — Inpatient Hospital Stay: Payer: Medicare HMO

## 2020-06-18 DIAGNOSIS — E039 Hypothyroidism, unspecified: Secondary | ICD-10-CM | POA: Diagnosis not present

## 2020-06-18 DIAGNOSIS — I4891 Unspecified atrial fibrillation: Secondary | ICD-10-CM | POA: Diagnosis not present

## 2020-06-18 DIAGNOSIS — T8189XD Other complications of procedures, not elsewhere classified, subsequent encounter: Secondary | ICD-10-CM | POA: Diagnosis not present

## 2020-06-18 DIAGNOSIS — K219 Gastro-esophageal reflux disease without esophagitis: Secondary | ICD-10-CM | POA: Diagnosis not present

## 2020-06-18 DIAGNOSIS — I509 Heart failure, unspecified: Secondary | ICD-10-CM | POA: Diagnosis not present

## 2020-06-18 DIAGNOSIS — I872 Venous insufficiency (chronic) (peripheral): Secondary | ICD-10-CM | POA: Diagnosis not present

## 2020-06-18 DIAGNOSIS — E1151 Type 2 diabetes mellitus with diabetic peripheral angiopathy without gangrene: Secondary | ICD-10-CM | POA: Diagnosis not present

## 2020-06-18 DIAGNOSIS — I110 Hypertensive heart disease with heart failure: Secondary | ICD-10-CM | POA: Diagnosis not present

## 2020-06-18 DIAGNOSIS — L89153 Pressure ulcer of sacral region, stage 3: Secondary | ICD-10-CM | POA: Diagnosis not present

## 2020-06-21 ENCOUNTER — Ambulatory Visit: Payer: Medicare HMO

## 2020-06-21 DIAGNOSIS — I872 Venous insufficiency (chronic) (peripheral): Secondary | ICD-10-CM | POA: Diagnosis not present

## 2020-06-21 DIAGNOSIS — I4891 Unspecified atrial fibrillation: Secondary | ICD-10-CM | POA: Diagnosis not present

## 2020-06-21 DIAGNOSIS — L89153 Pressure ulcer of sacral region, stage 3: Secondary | ICD-10-CM | POA: Diagnosis not present

## 2020-06-21 DIAGNOSIS — T8189XD Other complications of procedures, not elsewhere classified, subsequent encounter: Secondary | ICD-10-CM | POA: Diagnosis not present

## 2020-06-21 DIAGNOSIS — E1151 Type 2 diabetes mellitus with diabetic peripheral angiopathy without gangrene: Secondary | ICD-10-CM | POA: Diagnosis not present

## 2020-06-21 DIAGNOSIS — I509 Heart failure, unspecified: Secondary | ICD-10-CM | POA: Diagnosis not present

## 2020-06-21 DIAGNOSIS — E039 Hypothyroidism, unspecified: Secondary | ICD-10-CM | POA: Diagnosis not present

## 2020-06-21 DIAGNOSIS — K219 Gastro-esophageal reflux disease without esophagitis: Secondary | ICD-10-CM | POA: Diagnosis not present

## 2020-06-21 DIAGNOSIS — I110 Hypertensive heart disease with heart failure: Secondary | ICD-10-CM | POA: Diagnosis not present

## 2020-06-22 ENCOUNTER — Ambulatory Visit: Payer: Medicare HMO

## 2020-06-23 ENCOUNTER — Ambulatory Visit: Payer: Medicare HMO

## 2020-06-24 ENCOUNTER — Encounter: Payer: Self-pay | Admitting: Oncology

## 2020-06-24 ENCOUNTER — Ambulatory Visit: Payer: Medicare HMO

## 2020-06-25 ENCOUNTER — Ambulatory Visit: Payer: Medicare HMO

## 2020-06-25 DIAGNOSIS — I4891 Unspecified atrial fibrillation: Secondary | ICD-10-CM | POA: Diagnosis not present

## 2020-06-25 DIAGNOSIS — E039 Hypothyroidism, unspecified: Secondary | ICD-10-CM | POA: Diagnosis not present

## 2020-06-25 DIAGNOSIS — K219 Gastro-esophageal reflux disease without esophagitis: Secondary | ICD-10-CM | POA: Diagnosis not present

## 2020-06-25 DIAGNOSIS — I509 Heart failure, unspecified: Secondary | ICD-10-CM | POA: Diagnosis not present

## 2020-06-25 DIAGNOSIS — I872 Venous insufficiency (chronic) (peripheral): Secondary | ICD-10-CM | POA: Diagnosis not present

## 2020-06-25 DIAGNOSIS — I110 Hypertensive heart disease with heart failure: Secondary | ICD-10-CM | POA: Diagnosis not present

## 2020-06-25 DIAGNOSIS — T8189XD Other complications of procedures, not elsewhere classified, subsequent encounter: Secondary | ICD-10-CM | POA: Diagnosis not present

## 2020-06-25 DIAGNOSIS — E1151 Type 2 diabetes mellitus with diabetic peripheral angiopathy without gangrene: Secondary | ICD-10-CM | POA: Diagnosis not present

## 2020-06-25 DIAGNOSIS — L89153 Pressure ulcer of sacral region, stage 3: Secondary | ICD-10-CM | POA: Diagnosis not present

## 2020-06-25 NOTE — Progress Notes (Signed)
[facility]  [address] [place],  [place]  [address] [phone]  Clinic Day:  06/29/2020  Referring physician: Ernestene Kiel, MD  This document serves as a record of services personally performed by Marice Potter, MD. It was created on their behalf by [facility] E, a trained medical scribe. The creation of this record is based on the scribe's personal observations and the provider's statements to them.  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  A bone marrow biopsy after 6 cycles of azacitidine fortunately showed that her leukemia cells had been eradicated.  She was scheduled for her 7th cycle of treatment 2 weeks ago, but this was held due to her having significant abdominal pain.  She comes in today to be reassessed.  The patient claims her abdominal pain has all dissipated.  A recent GI workup, which included an EGD, colonoscopy and CT scans of her abdomen/pelvis, did not show any abnormality.  Clinically, she is doing much better to where she is fine with proceeding with her 7th cycle of azacitidine this week.  PHYSICAL EXAM:  Blood pressure (!) 193/85, pulse 82, temperature 98.9 F (37.2 C), resp. rate 14, height _0  (1.549 m), weight 184 lb 3.2 oz (83.6 kg), SpO2 94 %. Wt Readings from Last 3 Encounters:  06/29/20 184 lb 3.2 oz (83.6 kg)  06/15/20 184 lb 3.2 oz (83.6 kg)  06/01/20 194 lb 14.4 oz (88.4 kg)   Body mass index is 34.8 kg/m. Performance status (ECOG): 1 Physical Exam Constitutional:      Appearance: Normal appearance. She is not ill-appearing.  HENT:     Mouth/Throat:     Mouth: Mucous membranes are moist.     Pharynx: Oropharynx is clear. No oropharyngeal exudate or posterior oropharyngeal erythema.  Cardiovascular:     Rate and Rhythm: Normal rate and regular rhythm.     Heart sounds: No murmur heard.   No friction rub. No gallop.  Pulmonary:      Effort: Pulmonary effort is normal. No respiratory distress.     Breath sounds: Normal breath sounds. No wheezing, rhonchi or rales.  Chest:  Breasts:    Right: No axillary adenopathy or supraclavicular adenopathy.     Left: No axillary adenopathy or supraclavicular adenopathy.  Abdominal:     General: Bowel sounds are normal. There is no distension.     Palpations: Abdomen is soft. There is no mass.     Tenderness: There is no abdominal tenderness.  Musculoskeletal:        General: No swelling.     Right lower leg: No edema.     Left lower leg: No edema.  Lymphadenopathy:     Cervical: No cervical adenopathy.     Upper Body:     Right upper body: No supraclavicular or axillary adenopathy.     Left upper body: No supraclavicular or axillary adenopathy.     Lower Body: No right inguinal adenopathy. No left inguinal adenopathy.  Skin:    General: Skin is warm.     Coloration: Skin is not jaundiced.     Findings: Bruising present. No lesion or rash.  Neurological:     General: No focal deficit present.     Mental Status: She is alert and oriented to person, place, and time. Mental status is at baseline.     Cranial Nerves: Cranial nerves are intact.  Psychiatric:  Mood and Affect: Mood normal.        Behavior: Behavior normal.        Thought Content: Thought content normal.   LABS:   CBC Latest Ref Rng & Units 06/29/2020 06/15/2020 06/01/2020  WBC - 4.2 4.5 3.5  Hemoglobin 12.0 - 16.0 10.6(A) 10.8(A) 9.5(A)  Hematocrit 36 - 46 33(A) 32(A) 30(A)  Platelets 150 - 399 209 220 265   CMP Latest Ref Rng & Units 06/29/2020 06/15/2020 05/19/2020  Glucose 65 - 99 mg/dL - - -  BUN 4 - 21 22(A) 30(A) 21  Creatinine 0.5 - 1.1 0.9 1.3(A) 0.8  Sodium 137 - 147 135(A) 136(A) 137  Potassium 3.4 - 5.3 3.7 4.0 4.0  Chloride 99 - 108 100 98(A) 99  CO2 13 - 22 27(A) 27(A) 29(A)  Calcium 8.7 - 10.7 8.6(A) 9.2 8.4(A)  Alkaline Phos 25 - 125 - 188(A) -  AST 13 - 35 - 29 -  ALT 7 - 35 - 16 -     ASSESSMENT & PLAN:  Assessment/Plan:  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  The patient will proceed with her 7th cycle of azacitidine this week.  I remain pleased with her peripheral counts.  Clinically, she is doing much better.  I will see her back in 4 weeks before she heads into her 8th cycle of azacitidine.  The patient understands all the plans discussed today and is in agreement with them.     I, Rita Ohara, am acting as scribe for Marice Potter, MD    I have reviewed this report as typed by the medical scribe, and it is complete and accurate.  Dequincy Macarthur Critchley, MD

## 2020-06-28 ENCOUNTER — Other Ambulatory Visit: Payer: Self-pay | Admitting: Oncology

## 2020-06-28 DIAGNOSIS — K219 Gastro-esophageal reflux disease without esophagitis: Secondary | ICD-10-CM | POA: Diagnosis not present

## 2020-06-28 DIAGNOSIS — L89153 Pressure ulcer of sacral region, stage 3: Secondary | ICD-10-CM | POA: Diagnosis not present

## 2020-06-28 DIAGNOSIS — I110 Hypertensive heart disease with heart failure: Secondary | ICD-10-CM | POA: Diagnosis not present

## 2020-06-28 DIAGNOSIS — I872 Venous insufficiency (chronic) (peripheral): Secondary | ICD-10-CM | POA: Diagnosis not present

## 2020-06-28 DIAGNOSIS — E039 Hypothyroidism, unspecified: Secondary | ICD-10-CM | POA: Diagnosis not present

## 2020-06-28 DIAGNOSIS — I509 Heart failure, unspecified: Secondary | ICD-10-CM | POA: Diagnosis not present

## 2020-06-28 DIAGNOSIS — T8189XD Other complications of procedures, not elsewhere classified, subsequent encounter: Secondary | ICD-10-CM | POA: Diagnosis not present

## 2020-06-28 DIAGNOSIS — E1151 Type 2 diabetes mellitus with diabetic peripheral angiopathy without gangrene: Secondary | ICD-10-CM | POA: Diagnosis not present

## 2020-06-28 DIAGNOSIS — I4891 Unspecified atrial fibrillation: Secondary | ICD-10-CM | POA: Diagnosis not present

## 2020-06-29 ENCOUNTER — Inpatient Hospital Stay (INDEPENDENT_AMBULATORY_CARE_PROVIDER_SITE_OTHER): Payer: Medicare HMO

## 2020-06-29 ENCOUNTER — Inpatient Hospital Stay: Payer: Medicare HMO | Attending: Oncology | Admitting: Oncology

## 2020-06-29 ENCOUNTER — Other Ambulatory Visit: Payer: Self-pay

## 2020-06-29 ENCOUNTER — Encounter: Payer: Self-pay | Admitting: Oncology

## 2020-06-29 ENCOUNTER — Telehealth: Payer: Self-pay | Admitting: Oncology

## 2020-06-29 ENCOUNTER — Other Ambulatory Visit: Payer: Self-pay | Admitting: Oncology

## 2020-06-29 VITALS — BP 193/85 | HR 82 | Temp 98.9°F | Resp 14 | Ht 61.0 in | Wt 184.2 lb

## 2020-06-29 DIAGNOSIS — D46Z Other myelodysplastic syndromes: Secondary | ICD-10-CM | POA: Diagnosis not present

## 2020-06-29 DIAGNOSIS — D649 Anemia, unspecified: Secondary | ICD-10-CM | POA: Diagnosis not present

## 2020-06-29 DIAGNOSIS — Z5111 Encounter for antineoplastic chemotherapy: Secondary | ICD-10-CM | POA: Insufficient documentation

## 2020-06-29 LAB — CBC AND DIFFERENTIAL
HCT: 33 — AB (ref 36–46)
Hemoglobin: 10.6 — AB (ref 12.0–16.0)
Neutrophils Absolute: 2.98
Platelets: 209 (ref 150–399)
WBC: 4.2

## 2020-06-29 LAB — COMPREHENSIVE METABOLIC PANEL: Calcium: 8.6 — AB (ref 8.7–10.7)

## 2020-06-29 LAB — BASIC METABOLIC PANEL
BUN: 22 — AB (ref 4–21)
CO2: 27 — AB (ref 13–22)
Chloride: 100 (ref 99–108)
Creatinine: 0.9 (ref 0.5–1.1)
Glucose: 133
Potassium: 3.7 (ref 3.4–5.3)
Sodium: 135 — AB (ref 137–147)

## 2020-06-29 LAB — CBC: RBC: 2.88 — AB (ref 3.87–5.11)

## 2020-06-29 NOTE — Progress Notes (Signed)
Sent in request for DOS 06/16, 06/17, 06/20, 06/21, 06/22, 06/23, and 06/24 to [employer].

## 2020-06-29 NOTE — Telephone Encounter (Signed)
Per 06/29/20 los next appt scheduled and given to patient

## 2020-06-30 ENCOUNTER — Other Ambulatory Visit: Payer: Self-pay | Admitting: Oncology

## 2020-07-01 ENCOUNTER — Other Ambulatory Visit: Payer: Self-pay

## 2020-07-01 ENCOUNTER — Inpatient Hospital Stay: Payer: Medicare HMO

## 2020-07-01 VITALS — BP 166/70 | HR 64 | Temp 99.6°F | Resp 18 | Ht 61.0 in | Wt 184.0 lb

## 2020-07-01 DIAGNOSIS — D46Z Other myelodysplastic syndromes: Secondary | ICD-10-CM

## 2020-07-01 DIAGNOSIS — Z5111 Encounter for antineoplastic chemotherapy: Secondary | ICD-10-CM | POA: Diagnosis not present

## 2020-07-01 MED ORDER — ONDANSETRON HCL 8 MG PO TABS: 8.0000 mg | ORAL_TABLET | Freq: Once | ORAL | Status: DC

## 2020-07-01 MED ORDER — AZACITIDINE CHEMO SQ INJECTION
75.0000 mg/m2 | Freq: Once | INTRAMUSCULAR | Status: AC
  Administered 2020-07-01: 152.5 mg via SUBCUTANEOUS
  Filled 2020-07-01: qty 6.1

## 2020-07-01 NOTE — Progress Notes (Signed)
1333:PT STABLE AT TIME OF DISCHARGE ?

## 2020-07-01 NOTE — Patient Instructions (Signed)
[name]  Discharge Instructions: Thank you for choosing [facility] to provide your oncology and hematology care.  If you have a lab appointment with the [facility], please go directly to the [facility] and check in at the registration area.   Wear comfortable clothing and clothing appropriate for easy access to any Portacath or PICC line.   We strive to give you quality time with your provider. You may need to reschedule your appointment if you arrive late (15 or more minutes).  Arriving late affects you and other patients whose appointments are after yours.  Also, if you miss three or more appointments without notifying the office, you may be dismissed from the clinic at the provider's discretion.      For prescription refill requests, have your pharmacy contact our office and allow 72 hours for refills to be completed.    Today you received the following chemotherapy and/or immunotherapy agents Azacitadine    To help prevent nausea and vomiting after your treatment, we encourage you to take your nausea medication as directed.  BELOW ARE SYMPTOMS THAT SHOULD BE REPORTED IMMEDIATELY: *FEVER GREATER THAN 100.4 F (38 C) OR HIGHER *CHILLS OR SWEATING *NAUSEA AND VOMITING THAT IS NOT CONTROLLED WITH YOUR NAUSEA MEDICATION *UNUSUAL SHORTNESS OF BREATH *UNUSUAL BRUISING OR BLEEDING *URINARY PROBLEMS (pain or burning when urinating, or frequent urination) *BOWEL PROBLEMS (unusual diarrhea, constipation, pain near the anus) TENDERNESS IN MOUTH AND THROAT WITH OR WITHOUT PRESENCE OF ULCERS (sore throat, sores in mouth, or a toothache) UNUSUAL RASH, SWELLING OR PAIN  UNUSUAL VAGINAL DISCHARGE OR ITCHING   Items with * indicate a potential emergency and should be followed up as soon as possible or go to the Emergency Department if any problems should occur.  Please show the CHEMOTHERAPY ALERT CARD or IMMUNOTHERAPY ALERT CARD at check-in to the  Emergency Department and triage nurse.  Should you have questions after your visit or need to cancel or reschedule your appointment, please contact [facility]  Dept: [phone]  and follow the prompts.  Office hours are 8:00 a.m. to 4:30 p.m. Monday - Friday. Please note that voicemails left after 4:00 p.m. may not be returned until the following business day.  We are closed weekends and major holidays. You have access to a nurse at all times for urgent questions. Please call the main number to the clinic Dept: [phone] and follow the prompts.  For any non-urgent questions, you may also contact your provider using MyChart. We now offer e-Visits for anyone 35 and older to request care online for non-urgent symptoms. For details visit [email or web address].   Also download the MyChart app! Go to the app store, search "MyChart", open the app, select [facility], and log in with your MyChart username and password.  Due to Covid, a mask is required upon entering the hospital/clinic. If you do not have a mask, one will be given to you upon arrival. For doctor visits, patients may have 1 support person aged 42 or older with them. For treatment visits, patients cannot have anyone with them due to current Covid guidelines and our immunocompromised population.

## 2020-07-02 ENCOUNTER — Inpatient Hospital Stay: Payer: Medicare HMO

## 2020-07-02 VITALS — BP 144/64 | HR 82 | Temp 99.1°F | Resp 18 | Ht 61.0 in | Wt 184.5 lb

## 2020-07-02 DIAGNOSIS — E039 Hypothyroidism, unspecified: Secondary | ICD-10-CM | POA: Diagnosis not present

## 2020-07-02 DIAGNOSIS — T8189XD Other complications of procedures, not elsewhere classified, subsequent encounter: Secondary | ICD-10-CM | POA: Diagnosis not present

## 2020-07-02 DIAGNOSIS — D46Z Other myelodysplastic syndromes: Secondary | ICD-10-CM | POA: Diagnosis not present

## 2020-07-02 DIAGNOSIS — I509 Heart failure, unspecified: Secondary | ICD-10-CM | POA: Diagnosis not present

## 2020-07-02 DIAGNOSIS — E1151 Type 2 diabetes mellitus with diabetic peripheral angiopathy without gangrene: Secondary | ICD-10-CM | POA: Diagnosis not present

## 2020-07-02 DIAGNOSIS — I4891 Unspecified atrial fibrillation: Secondary | ICD-10-CM | POA: Diagnosis not present

## 2020-07-02 DIAGNOSIS — K219 Gastro-esophageal reflux disease without esophagitis: Secondary | ICD-10-CM | POA: Diagnosis not present

## 2020-07-02 DIAGNOSIS — Z5111 Encounter for antineoplastic chemotherapy: Secondary | ICD-10-CM | POA: Diagnosis not present

## 2020-07-02 DIAGNOSIS — L89153 Pressure ulcer of sacral region, stage 3: Secondary | ICD-10-CM | POA: Diagnosis not present

## 2020-07-02 DIAGNOSIS — I872 Venous insufficiency (chronic) (peripheral): Secondary | ICD-10-CM | POA: Diagnosis not present

## 2020-07-02 DIAGNOSIS — I110 Hypertensive heart disease with heart failure: Secondary | ICD-10-CM | POA: Diagnosis not present

## 2020-07-02 MED ORDER — ONDANSETRON HCL 8 MG PO TABS: 8.0000 mg | ORAL_TABLET | Freq: Once | ORAL | Status: DC

## 2020-07-02 MED ORDER — AZACITIDINE CHEMO SQ INJECTION
75.0000 mg/m2 | Freq: Once | INTRAMUSCULAR | Status: AC
  Administered 2020-07-02: 152.5 mg via SUBCUTANEOUS
  Filled 2020-07-02: qty 6.1

## 2020-07-02 NOTE — Patient Instructions (Signed)
[name]  Discharge Instructions: Thank you for choosing [facility] to provide your oncology and hematology care.  If you have a lab appointment with the [facility], please go directly to the [facility] and check in at the registration area.   Wear comfortable clothing and clothing appropriate for easy access to any Portacath or PICC line.   We strive to give you quality time with your provider. You may need to reschedule your appointment if you arrive late (15 or more minutes).  Arriving late affects you and other patients whose appointments are after yours.  Also, if you miss three or more appointments without notifying the office, you may be dismissed from the clinic at the provider's discretion.      For prescription refill requests, have your pharmacy contact our office and allow 72 hours for refills to be completed.    Today you received the following chemotherapy and/or immunotherapy agents Azacitadine    To help prevent nausea and vomiting after your treatment, we encourage you to take your nausea medication as directed.  BELOW ARE SYMPTOMS THAT SHOULD BE REPORTED IMMEDIATELY: *FEVER GREATER THAN 100.4 F (38 C) OR HIGHER *CHILLS OR SWEATING *NAUSEA AND VOMITING THAT IS NOT CONTROLLED WITH YOUR NAUSEA MEDICATION *UNUSUAL SHORTNESS OF BREATH *UNUSUAL BRUISING OR BLEEDING *URINARY PROBLEMS (pain or burning when urinating, or frequent urination) *BOWEL PROBLEMS (unusual diarrhea, constipation, pain near the anus) TENDERNESS IN MOUTH AND THROAT WITH OR WITHOUT PRESENCE OF ULCERS (sore throat, sores in mouth, or a toothache) UNUSUAL RASH, SWELLING OR PAIN  UNUSUAL VAGINAL DISCHARGE OR ITCHING   Items with * indicate a potential emergency and should be followed up as soon as possible or go to the Emergency Department if any problems should occur.  Please show the CHEMOTHERAPY ALERT CARD or IMMUNOTHERAPY ALERT CARD at check-in to the  Emergency Department and triage nurse.  Should you have questions after your visit or need to cancel or reschedule your appointment, please contact [facility]  Dept: [phone]  and follow the prompts.  Office hours are 8:00 a.m. to 4:30 p.m. Monday - Friday. Please note that voicemails left after 4:00 p.m. may not be returned until the following business day.  We are closed weekends and major holidays. You have access to a nurse at all times for urgent questions. Please call the main number to the clinic Dept: [phone] and follow the prompts.  For any non-urgent questions, you may also contact your provider using MyChart. We now offer e-Visits for anyone 80 and older to request care online for non-urgent symptoms. For details visit [email or web address].   Also download the MyChart app! Go to the app store, search "MyChart", open the app, select [facility], and log in with your MyChart username and password.  Due to Covid, a mask is required upon entering the hospital/clinic. If you do not have a mask, one will be given to you upon arrival. For doctor visits, patients may have 1 support person aged 80 or older with them. For treatment visits, patients cannot have anyone with them due to current Covid guidelines and our immunocompromised population.

## 2020-07-02 NOTE — Progress Notes (Signed)
1313:PT STABLE AT TIME OF DISCHARGE °

## 2020-07-05 ENCOUNTER — Inpatient Hospital Stay: Payer: Medicare HMO

## 2020-07-05 ENCOUNTER — Other Ambulatory Visit: Payer: Self-pay

## 2020-07-05 ENCOUNTER — Encounter: Payer: Self-pay | Admitting: Oncology

## 2020-07-05 VITALS — BP 159/63 | HR 66 | Temp 98.6°F | Resp 18 | Ht 61.0 in | Wt 186.2 lb

## 2020-07-05 DIAGNOSIS — D46Z Other myelodysplastic syndromes: Secondary | ICD-10-CM

## 2020-07-05 DIAGNOSIS — E039 Hypothyroidism, unspecified: Secondary | ICD-10-CM | POA: Diagnosis not present

## 2020-07-05 DIAGNOSIS — I4891 Unspecified atrial fibrillation: Secondary | ICD-10-CM | POA: Diagnosis not present

## 2020-07-05 DIAGNOSIS — I110 Hypertensive heart disease with heart failure: Secondary | ICD-10-CM | POA: Diagnosis not present

## 2020-07-05 DIAGNOSIS — Z5111 Encounter for antineoplastic chemotherapy: Secondary | ICD-10-CM | POA: Diagnosis not present

## 2020-07-05 DIAGNOSIS — I872 Venous insufficiency (chronic) (peripheral): Secondary | ICD-10-CM | POA: Diagnosis not present

## 2020-07-05 DIAGNOSIS — I509 Heart failure, unspecified: Secondary | ICD-10-CM | POA: Diagnosis not present

## 2020-07-05 DIAGNOSIS — E1151 Type 2 diabetes mellitus with diabetic peripheral angiopathy without gangrene: Secondary | ICD-10-CM | POA: Diagnosis not present

## 2020-07-05 DIAGNOSIS — K219 Gastro-esophageal reflux disease without esophagitis: Secondary | ICD-10-CM | POA: Diagnosis not present

## 2020-07-05 DIAGNOSIS — T8189XD Other complications of procedures, not elsewhere classified, subsequent encounter: Secondary | ICD-10-CM | POA: Diagnosis not present

## 2020-07-05 DIAGNOSIS — L89153 Pressure ulcer of sacral region, stage 3: Secondary | ICD-10-CM | POA: Diagnosis not present

## 2020-07-05 MED ORDER — ONDANSETRON HCL 8 MG PO TABS: 8.0000 mg | ORAL_TABLET | Freq: Once | ORAL | Status: DC

## 2020-07-05 MED ORDER — AZACITIDINE CHEMO SQ INJECTION
75.0000 mg/m2 | Freq: Once | INTRAMUSCULAR | Status: AC
  Administered 2020-07-05: 152.5 mg via SUBCUTANEOUS
  Filled 2020-07-05: qty 6.1

## 2020-07-05 NOTE — Progress Notes (Signed)
1305: PT STABLE AT TIME OF DISCHARGE  

## 2020-07-05 NOTE — Patient Instructions (Signed)
[facility]  Discharge Instructions: Thank you for choosing [facility] to provide your oncology and hematology care.  If you have a lab appointment with the [facility], please go directly to the [facility] and check in at the registration area.   Wear comfortable clothing and clothing appropriate for easy access to any Portacath or PICC line.   We strive to give you quality time with your provider. You may need to reschedule your appointment if you arrive late (15 or more minutes).  Arriving late affects you and other patients whose appointments are after yours.  Also, if you miss three or more appointments without notifying the office, you may be dismissed from the clinic at the provider's discretion.      For prescription refill requests, have your pharmacy contact our office and allow 72 hours for refills to be completed.    Today you received the following chemotherapy and/or immunotherapy agents Azacitadine   To help prevent nausea and vomiting after your treatment, we encourage you to take your nausea medication as directed.  BELOW ARE SYMPTOMS THAT SHOULD BE REPORTED IMMEDIATELY: *FEVER GREATER THAN 100.4 F (38 C) OR HIGHER *CHILLS OR SWEATING *NAUSEA AND VOMITING THAT IS NOT CONTROLLED WITH YOUR NAUSEA MEDICATION *UNUSUAL SHORTNESS OF BREATH *UNUSUAL BRUISING OR BLEEDING *URINARY PROBLEMS (pain or burning when urinating, or frequent urination) *BOWEL PROBLEMS (unusual diarrhea, constipation, pain near the anus) TENDERNESS IN MOUTH AND THROAT WITH OR WITHOUT PRESENCE OF ULCERS (sore throat, sores in mouth, or a toothache) UNUSUAL RASH, SWELLING OR PAIN  UNUSUAL VAGINAL DISCHARGE OR ITCHING   Items with * indicate a potential emergency and should be followed up as soon as possible or go to the Emergency Department if any problems should occur.  Please show the CHEMOTHERAPY ALERT CARD or IMMUNOTHERAPY ALERT CARD at check-in to the  Emergency Department and triage nurse.  Should you have questions after your visit or need to cancel or reschedule your appointment, please contact [facility]  Dept: [phone]  and follow the prompts.  Office hours are 8:00 a.m. to 4:30 p.m. Monday - Friday. Please note that voicemails left after 4:00 p.m. may not be returned until the following business day.  We are closed weekends and major holidays. You have access to a nurse at all times for urgent questions. Please call the main number to the clinic Dept: [phone] and follow the prompts.  For any non-urgent questions, you may also contact your provider using MyChart. We now offer e-Visits for anyone 25 and older to request care online for non-urgent symptoms. For details visit [email or web address].   Also download the MyChart app! Go to the app store, search "MyChart", open the app, select [facility], and log in with your MyChart username and password.  Due to Covid, a mask is required upon entering the hospital/clinic. If you do not have a mask, one will be given to you upon arrival. For doctor visits, patients may have 1 support person aged 84 or older with them. For treatment visits, patients cannot have anyone with them due to current Covid guidelines and our immunocompromised population.

## 2020-07-06 ENCOUNTER — Inpatient Hospital Stay: Payer: Medicare HMO

## 2020-07-06 VITALS — BP 138/64 | HR 71 | Temp 98.6°F | Resp 18 | Wt 186.1 lb

## 2020-07-06 DIAGNOSIS — I4891 Unspecified atrial fibrillation: Secondary | ICD-10-CM | POA: Diagnosis not present

## 2020-07-06 DIAGNOSIS — Z5111 Encounter for antineoplastic chemotherapy: Secondary | ICD-10-CM | POA: Diagnosis not present

## 2020-07-06 DIAGNOSIS — D46Z Other myelodysplastic syndromes: Secondary | ICD-10-CM | POA: Diagnosis not present

## 2020-07-06 DIAGNOSIS — T8131XD Disruption of external operation (surgical) wound, not elsewhere classified, subsequent encounter: Secondary | ICD-10-CM | POA: Diagnosis not present

## 2020-07-06 DIAGNOSIS — I509 Heart failure, unspecified: Secondary | ICD-10-CM | POA: Diagnosis not present

## 2020-07-06 DIAGNOSIS — I890 Lymphedema, not elsewhere classified: Secondary | ICD-10-CM | POA: Diagnosis not present

## 2020-07-06 DIAGNOSIS — I872 Venous insufficiency (chronic) (peripheral): Secondary | ICD-10-CM | POA: Diagnosis not present

## 2020-07-06 DIAGNOSIS — E119 Type 2 diabetes mellitus without complications: Secondary | ICD-10-CM | POA: Diagnosis not present

## 2020-07-06 DIAGNOSIS — L89154 Pressure ulcer of sacral region, stage 4: Secondary | ICD-10-CM | POA: Diagnosis not present

## 2020-07-06 MED ORDER — ONDANSETRON HCL 8 MG PO TABS: 8.0000 mg | ORAL_TABLET | Freq: Once | ORAL | Status: DC

## 2020-07-06 MED ORDER — AZACITIDINE CHEMO SQ INJECTION
75.0000 mg/m2 | Freq: Once | INTRAMUSCULAR | Status: AC
  Administered 2020-07-06: 152.5 mg via SUBCUTANEOUS
  Filled 2020-07-06: qty 6.1

## 2020-07-06 NOTE — Progress Notes (Signed)
PT STABLE AT TIME OF DISCHARGE 

## 2020-07-06 NOTE — Patient Instructions (Signed)
[name]  Discharge Instructions: Thank you for choosing [facility] to provide your oncology and hematology care.  If you have a lab appointment with the [facility], please go directly to the [facility] and check in at the registration area.   Wear comfortable clothing and clothing appropriate for easy access to any Portacath or PICC line.   We strive to give you quality time with your provider. You may need to reschedule your appointment if you arrive late (15 or more minutes).  Arriving late affects you and other patients whose appointments are after yours.  Also, if you miss three or more appointments without notifying the office, you may be dismissed from the clinic at the provider's discretion.      For prescription refill requests, have your pharmacy contact our office and allow 72 hours for refills to be completed.    Today you received the following chemotherapy and/or immunotherapy agents Azacitadine    To help prevent nausea and vomiting after your treatment, we encourage you to take your nausea medication as directed.  BELOW ARE SYMPTOMS THAT SHOULD BE REPORTED IMMEDIATELY: *FEVER GREATER THAN 100.4 F (38 C) OR HIGHER *CHILLS OR SWEATING *NAUSEA AND VOMITING THAT IS NOT CONTROLLED WITH YOUR NAUSEA MEDICATION *UNUSUAL SHORTNESS OF BREATH *UNUSUAL BRUISING OR BLEEDING *URINARY PROBLEMS (pain or burning when urinating, or frequent urination) *BOWEL PROBLEMS (unusual diarrhea, constipation, pain near the anus) TENDERNESS IN MOUTH AND THROAT WITH OR WITHOUT PRESENCE OF ULCERS (sore throat, sores in mouth, or a toothache) UNUSUAL RASH, SWELLING OR PAIN  UNUSUAL VAGINAL DISCHARGE OR ITCHING   Items with * indicate a potential emergency and should be followed up as soon as possible or go to the Emergency Department if any problems should occur.  Please show the CHEMOTHERAPY ALERT CARD or IMMUNOTHERAPY ALERT CARD at check-in to the  Emergency Department and triage nurse.  Should you have questions after your visit or need to cancel or reschedule your appointment, please contact [facility]  Dept: [phone]  and follow the prompts.  Office hours are 8:00 a.m. to 4:30 p.m. Monday - Friday. Please note that voicemails left after 4:00 p.m. may not be returned until the following business day.  We are closed weekends and major holidays. You have access to a nurse at all times for urgent questions. Please call the main number to the clinic Dept: [phone] and follow the prompts.  For any non-urgent questions, you may also contact your provider using MyChart. We now offer e-Visits for anyone 35 and older to request care online for non-urgent symptoms. For details visit [email or web address].   Also download the MyChart app! Go to the app store, search "MyChart", open the app, select [facility], and log in with your MyChart username and password.  Due to Covid, a mask is required upon entering the hospital/clinic. If you do not have a mask, one will be given to you upon arrival. For doctor visits, patients may have 1 support person aged 42 or older with them. For treatment visits, patients cannot have anyone with them due to current Covid guidelines and our immunocompromised population.

## 2020-07-07 ENCOUNTER — Inpatient Hospital Stay: Payer: Medicare HMO

## 2020-07-07 ENCOUNTER — Other Ambulatory Visit: Payer: Self-pay

## 2020-07-07 VITALS — BP 158/65 | HR 69 | Temp 99.0°F | Resp 18 | Wt 187.1 lb

## 2020-07-07 DIAGNOSIS — Z5111 Encounter for antineoplastic chemotherapy: Secondary | ICD-10-CM | POA: Diagnosis not present

## 2020-07-07 DIAGNOSIS — D46Z Other myelodysplastic syndromes: Secondary | ICD-10-CM | POA: Diagnosis not present

## 2020-07-07 MED ORDER — AZACITIDINE CHEMO SQ INJECTION
75.0000 mg/m2 | Freq: Once | INTRAMUSCULAR | Status: AC
  Administered 2020-07-07: 152.5 mg via SUBCUTANEOUS
  Filled 2020-07-07: qty 6.1

## 2020-07-07 MED ORDER — ONDANSETRON HCL 8 MG PO TABS: 8.0000 mg | ORAL_TABLET | Freq: Once | ORAL | Status: DC

## 2020-07-07 NOTE — Progress Notes (Signed)
1335: PT STABLE AT TIME OF DISCHARGE  

## 2020-07-07 NOTE — Patient Instructions (Signed)
[facility]  Discharge Instructions: Thank you for choosing [facility] to provide your oncology and hematology care.  If you have a lab appointment with the [facility], please go directly to the [facility] and check in at the registration area.   Wear comfortable clothing and clothing appropriate for easy access to any Portacath or PICC line.   We strive to give you quality time with your provider. You may need to reschedule your appointment if you arrive late (15 or more minutes).  Arriving late affects you and other patients whose appointments are after yours.  Also, if you miss three or more appointments without notifying the office, you may be dismissed from the clinic at the provider's discretion.      For prescription refill requests, have your pharmacy contact our office and allow 72 hours for refills to be completed.    Today you received the following chemotherapy and/or immunotherapy agents Azacitadine     To help prevent nausea and vomiting after your treatment, we encourage you to take your nausea medication as directed.  BELOW ARE SYMPTOMS THAT SHOULD BE REPORTED IMMEDIATELY: *FEVER GREATER THAN 100.4 F (38 C) OR HIGHER *CHILLS OR SWEATING *NAUSEA AND VOMITING THAT IS NOT CONTROLLED WITH YOUR NAUSEA MEDICATION *UNUSUAL SHORTNESS OF BREATH *UNUSUAL BRUISING OR BLEEDING *URINARY PROBLEMS (pain or burning when urinating, or frequent urination) *BOWEL PROBLEMS (unusual diarrhea, constipation, pain near the anus) TENDERNESS IN MOUTH AND THROAT WITH OR WITHOUT PRESENCE OF ULCERS (sore throat, sores in mouth, or a toothache) UNUSUAL RASH, SWELLING OR PAIN  UNUSUAL VAGINAL DISCHARGE OR ITCHING   Items with * indicate a potential emergency and should be followed up as soon as possible or go to the Emergency Department if any problems should occur.  Please show the CHEMOTHERAPY ALERT CARD or IMMUNOTHERAPY ALERT CARD at check-in to the  Emergency Department and triage nurse.  Should you have questions after your visit or need to cancel or reschedule your appointment, please contact [facility]  Dept: [phone]  and follow the prompts.  Office hours are 8:00 a.m. to 4:30 p.m. Monday - Friday. Please note that voicemails left after 4:00 p.m. may not be returned until the following business day.  We are closed weekends and major holidays. You have access to a nurse at all times for urgent questions. Please call the main number to the clinic Dept: [phone] and follow the prompts.  For any non-urgent questions, you may also contact your provider using MyChart. We now offer e-Visits for anyone 20 and older to request care online for non-urgent symptoms. For details visit [email or web address].   Also download the MyChart app! Go to the app store, search "MyChart", open the app, select [facility], and log in with your MyChart username and password.  Due to Covid, a mask is required upon entering the hospital/clinic. If you do not have a mask, one will be given to you upon arrival. For doctor visits, patients may have 1 support person aged 48 or older with them. For treatment visits, patients cannot have anyone with them due to current Covid guidelines and our immunocompromised population.

## 2020-07-08 ENCOUNTER — Inpatient Hospital Stay: Payer: Medicare HMO

## 2020-07-08 VITALS — BP 160/72 | HR 66 | Temp 98.2°F | Resp 18 | Ht 61.0 in | Wt 186.8 lb

## 2020-07-08 DIAGNOSIS — D46Z Other myelodysplastic syndromes: Secondary | ICD-10-CM

## 2020-07-08 DIAGNOSIS — Z5111 Encounter for antineoplastic chemotherapy: Secondary | ICD-10-CM | POA: Diagnosis not present

## 2020-07-08 MED ORDER — AZACITIDINE CHEMO SQ INJECTION
75.0000 mg/m2 | Freq: Once | INTRAMUSCULAR | Status: AC
  Administered 2020-07-08: 152.5 mg via SUBCUTANEOUS
  Filled 2020-07-08: qty 6.1

## 2020-07-08 MED ORDER — ONDANSETRON HCL 8 MG PO TABS: 8.0000 mg | ORAL_TABLET | Freq: Once | ORAL | Status: DC

## 2020-07-08 NOTE — Progress Notes (Signed)
1333:PT STABLE AT TIME OF DISCHARGE ?

## 2020-07-09 ENCOUNTER — Inpatient Hospital Stay: Payer: Medicare HMO

## 2020-07-09 ENCOUNTER — Other Ambulatory Visit: Payer: Self-pay

## 2020-07-09 VITALS — BP 160/68 | HR 64 | Resp 18 | Ht 61.0 in | Wt 186.2 lb

## 2020-07-09 DIAGNOSIS — I110 Hypertensive heart disease with heart failure: Secondary | ICD-10-CM | POA: Diagnosis not present

## 2020-07-09 DIAGNOSIS — I509 Heart failure, unspecified: Secondary | ICD-10-CM | POA: Diagnosis not present

## 2020-07-09 DIAGNOSIS — K219 Gastro-esophageal reflux disease without esophagitis: Secondary | ICD-10-CM | POA: Diagnosis not present

## 2020-07-09 DIAGNOSIS — D46Z Other myelodysplastic syndromes: Secondary | ICD-10-CM

## 2020-07-09 DIAGNOSIS — I4891 Unspecified atrial fibrillation: Secondary | ICD-10-CM | POA: Diagnosis not present

## 2020-07-09 DIAGNOSIS — T8189XD Other complications of procedures, not elsewhere classified, subsequent encounter: Secondary | ICD-10-CM | POA: Diagnosis not present

## 2020-07-09 DIAGNOSIS — Z5111 Encounter for antineoplastic chemotherapy: Secondary | ICD-10-CM | POA: Diagnosis not present

## 2020-07-09 DIAGNOSIS — I872 Venous insufficiency (chronic) (peripheral): Secondary | ICD-10-CM | POA: Diagnosis not present

## 2020-07-09 DIAGNOSIS — E039 Hypothyroidism, unspecified: Secondary | ICD-10-CM | POA: Diagnosis not present

## 2020-07-09 DIAGNOSIS — E1151 Type 2 diabetes mellitus with diabetic peripheral angiopathy without gangrene: Secondary | ICD-10-CM | POA: Diagnosis not present

## 2020-07-09 DIAGNOSIS — L89153 Pressure ulcer of sacral region, stage 3: Secondary | ICD-10-CM | POA: Diagnosis not present

## 2020-07-09 MED ORDER — AZACITIDINE CHEMO SQ INJECTION
75.0000 mg/m2 | Freq: Once | INTRAMUSCULAR | Status: AC
  Administered 2020-07-09: 152.5 mg via SUBCUTANEOUS
  Filled 2020-07-09: qty 6.1

## 2020-07-09 MED ORDER — ONDANSETRON HCL 8 MG PO TABS: 8.0000 mg | ORAL_TABLET | Freq: Once | ORAL | Status: DC

## 2020-07-09 NOTE — Patient Instructions (Signed)
[facility]  Discharge Instructions: Thank you for choosing [facility] to provide your oncology and hematology care.  If you have a lab appointment with the [facility], please go directly to the [facility] and check in at the registration area.   Wear comfortable clothing and clothing appropriate for easy access to any Portacath or PICC line.   We strive to give you quality time with your provider. You may need to reschedule your appointment if you arrive late (15 or more minutes).  Arriving late affects you and other patients whose appointments are after yours.  Also, if you miss three or more appointments without notifying the office, you may be dismissed from the clinic at the provider's discretion.      For prescription refill requests, have your pharmacy contact our office and allow 72 hours for refills to be completed.    Today you received the following chemotherapy and/or immunotherapy agents Azacitadine      To help prevent nausea and vomiting after your treatment, we encourage you to take your nausea medication as directed.  BELOW ARE SYMPTOMS THAT SHOULD BE REPORTED IMMEDIATELY: *FEVER GREATER THAN 100.4 F (38 C) OR HIGHER *CHILLS OR SWEATING *NAUSEA AND VOMITING THAT IS NOT CONTROLLED WITH YOUR NAUSEA MEDICATION *UNUSUAL SHORTNESS OF BREATH *UNUSUAL BRUISING OR BLEEDING *URINARY PROBLEMS (pain or burning when urinating, or frequent urination) *BOWEL PROBLEMS (unusual diarrhea, constipation, pain near the anus) TENDERNESS IN MOUTH AND THROAT WITH OR WITHOUT PRESENCE OF ULCERS (sore throat, sores in mouth, or a toothache) UNUSUAL RASH, SWELLING OR PAIN  UNUSUAL VAGINAL DISCHARGE OR ITCHING   Items with * indicate a potential emergency and should be followed up as soon as possible or go to the Emergency Department if any problems should occur.  Please show the CHEMOTHERAPY ALERT CARD or IMMUNOTHERAPY ALERT CARD at check-in to the  Emergency Department and triage nurse.  Should you have questions after your visit or need to cancel or reschedule your appointment, please contact [facility]  Dept: [phone]  and follow the prompts.  Office hours are 8:00 a.m. to 4:30 p.m. Monday - Friday. Please note that voicemails left after 4:00 p.m. may not be returned until the following business day.  We are closed weekends and major holidays. You have access to a nurse at all times for urgent questions. Please call the main number to the clinic Dept: [phone] and follow the prompts.  For any non-urgent questions, you may also contact your provider using MyChart. We now offer e-Visits for anyone 6 and older to request care online for non-urgent symptoms. For details visit [email or web address].   Also download the MyChart app! Go to the app store, search "MyChart", open the app, select , and log in with your MyChart username and password.  Due to Covid, a mask is required upon entering the hospital/clinic. If you do not have a mask, one will be given to you upon arrival. For doctor visits, patients may have 1 support person aged 82 or older with them. For treatment visits, patients cannot have anyone with them due to current Covid guidelines and our immunocompromised population.

## 2020-07-09 NOTE — Progress Notes (Signed)
1315: PT STABLE AT TIME OF DISCHARGE  

## 2020-07-12 DIAGNOSIS — L89153 Pressure ulcer of sacral region, stage 3: Secondary | ICD-10-CM | POA: Diagnosis not present

## 2020-07-12 DIAGNOSIS — K219 Gastro-esophageal reflux disease without esophagitis: Secondary | ICD-10-CM | POA: Diagnosis not present

## 2020-07-12 DIAGNOSIS — I4891 Unspecified atrial fibrillation: Secondary | ICD-10-CM | POA: Diagnosis not present

## 2020-07-12 DIAGNOSIS — T8189XD Other complications of procedures, not elsewhere classified, subsequent encounter: Secondary | ICD-10-CM | POA: Diagnosis not present

## 2020-07-12 DIAGNOSIS — I872 Venous insufficiency (chronic) (peripheral): Secondary | ICD-10-CM | POA: Diagnosis not present

## 2020-07-12 DIAGNOSIS — E039 Hypothyroidism, unspecified: Secondary | ICD-10-CM | POA: Diagnosis not present

## 2020-07-12 DIAGNOSIS — I509 Heart failure, unspecified: Secondary | ICD-10-CM | POA: Diagnosis not present

## 2020-07-12 DIAGNOSIS — E1151 Type 2 diabetes mellitus with diabetic peripheral angiopathy without gangrene: Secondary | ICD-10-CM | POA: Diagnosis not present

## 2020-07-12 DIAGNOSIS — I110 Hypertensive heart disease with heart failure: Secondary | ICD-10-CM | POA: Diagnosis not present

## 2020-07-12 NOTE — Progress Notes (Signed)
Sent in request for DOS 07/14, 07/15, 07/18, 07/19, 07/20, 07/21, and 07/22 to [employer].

## 2020-07-15 DIAGNOSIS — I110 Hypertensive heart disease with heart failure: Secondary | ICD-10-CM | POA: Diagnosis not present

## 2020-07-15 DIAGNOSIS — E785 Hyperlipidemia, unspecified: Secondary | ICD-10-CM | POA: Diagnosis not present

## 2020-07-15 DIAGNOSIS — I509 Heart failure, unspecified: Secondary | ICD-10-CM | POA: Diagnosis not present

## 2020-07-15 DIAGNOSIS — E039 Hypothyroidism, unspecified: Secondary | ICD-10-CM | POA: Diagnosis not present

## 2020-07-16 DIAGNOSIS — L89153 Pressure ulcer of sacral region, stage 3: Secondary | ICD-10-CM | POA: Diagnosis not present

## 2020-07-16 DIAGNOSIS — E039 Hypothyroidism, unspecified: Secondary | ICD-10-CM | POA: Diagnosis not present

## 2020-07-16 DIAGNOSIS — I872 Venous insufficiency (chronic) (peripheral): Secondary | ICD-10-CM | POA: Diagnosis not present

## 2020-07-16 DIAGNOSIS — I509 Heart failure, unspecified: Secondary | ICD-10-CM | POA: Diagnosis not present

## 2020-07-16 DIAGNOSIS — T8189XD Other complications of procedures, not elsewhere classified, subsequent encounter: Secondary | ICD-10-CM | POA: Diagnosis not present

## 2020-07-16 DIAGNOSIS — K219 Gastro-esophageal reflux disease without esophagitis: Secondary | ICD-10-CM | POA: Diagnosis not present

## 2020-07-16 DIAGNOSIS — E1151 Type 2 diabetes mellitus with diabetic peripheral angiopathy without gangrene: Secondary | ICD-10-CM | POA: Diagnosis not present

## 2020-07-16 DIAGNOSIS — I4891 Unspecified atrial fibrillation: Secondary | ICD-10-CM | POA: Diagnosis not present

## 2020-07-16 DIAGNOSIS — I110 Hypertensive heart disease with heart failure: Secondary | ICD-10-CM | POA: Diagnosis not present

## 2020-07-20 DIAGNOSIS — I509 Heart failure, unspecified: Secondary | ICD-10-CM | POA: Diagnosis not present

## 2020-07-20 DIAGNOSIS — I872 Venous insufficiency (chronic) (peripheral): Secondary | ICD-10-CM | POA: Diagnosis not present

## 2020-07-20 DIAGNOSIS — K219 Gastro-esophageal reflux disease without esophagitis: Secondary | ICD-10-CM | POA: Diagnosis not present

## 2020-07-20 DIAGNOSIS — E039 Hypothyroidism, unspecified: Secondary | ICD-10-CM | POA: Diagnosis not present

## 2020-07-20 DIAGNOSIS — I110 Hypertensive heart disease with heart failure: Secondary | ICD-10-CM | POA: Diagnosis not present

## 2020-07-20 DIAGNOSIS — E1151 Type 2 diabetes mellitus with diabetic peripheral angiopathy without gangrene: Secondary | ICD-10-CM | POA: Diagnosis not present

## 2020-07-20 DIAGNOSIS — L89153 Pressure ulcer of sacral region, stage 3: Secondary | ICD-10-CM | POA: Diagnosis not present

## 2020-07-20 DIAGNOSIS — I4891 Unspecified atrial fibrillation: Secondary | ICD-10-CM | POA: Diagnosis not present

## 2020-07-20 DIAGNOSIS — T8189XD Other complications of procedures, not elsewhere classified, subsequent encounter: Secondary | ICD-10-CM | POA: Diagnosis not present

## 2020-07-23 DIAGNOSIS — T8189XD Other complications of procedures, not elsewhere classified, subsequent encounter: Secondary | ICD-10-CM | POA: Diagnosis not present

## 2020-07-23 DIAGNOSIS — K219 Gastro-esophageal reflux disease without esophagitis: Secondary | ICD-10-CM | POA: Diagnosis not present

## 2020-07-23 DIAGNOSIS — I509 Heart failure, unspecified: Secondary | ICD-10-CM | POA: Diagnosis not present

## 2020-07-23 DIAGNOSIS — I4891 Unspecified atrial fibrillation: Secondary | ICD-10-CM | POA: Diagnosis not present

## 2020-07-23 DIAGNOSIS — E039 Hypothyroidism, unspecified: Secondary | ICD-10-CM | POA: Diagnosis not present

## 2020-07-23 DIAGNOSIS — I110 Hypertensive heart disease with heart failure: Secondary | ICD-10-CM | POA: Diagnosis not present

## 2020-07-23 DIAGNOSIS — I872 Venous insufficiency (chronic) (peripheral): Secondary | ICD-10-CM | POA: Diagnosis not present

## 2020-07-23 DIAGNOSIS — L89153 Pressure ulcer of sacral region, stage 3: Secondary | ICD-10-CM | POA: Diagnosis not present

## 2020-07-23 DIAGNOSIS — E1151 Type 2 diabetes mellitus with diabetic peripheral angiopathy without gangrene: Secondary | ICD-10-CM | POA: Diagnosis not present

## 2020-07-26 DIAGNOSIS — I4821 Permanent atrial fibrillation: Secondary | ICD-10-CM | POA: Diagnosis not present

## 2020-07-26 DIAGNOSIS — I4891 Unspecified atrial fibrillation: Secondary | ICD-10-CM | POA: Diagnosis not present

## 2020-07-26 DIAGNOSIS — I110 Hypertensive heart disease with heart failure: Secondary | ICD-10-CM | POA: Diagnosis not present

## 2020-07-26 DIAGNOSIS — I509 Heart failure, unspecified: Secondary | ICD-10-CM | POA: Diagnosis not present

## 2020-07-26 DIAGNOSIS — K219 Gastro-esophageal reflux disease without esophagitis: Secondary | ICD-10-CM | POA: Diagnosis not present

## 2020-07-26 DIAGNOSIS — Z950 Presence of cardiac pacemaker: Secondary | ICD-10-CM | POA: Diagnosis not present

## 2020-07-26 DIAGNOSIS — Z7901 Long term (current) use of anticoagulants: Secondary | ICD-10-CM | POA: Diagnosis not present

## 2020-07-26 DIAGNOSIS — T8189XD Other complications of procedures, not elsewhere classified, subsequent encounter: Secondary | ICD-10-CM | POA: Diagnosis not present

## 2020-07-26 DIAGNOSIS — I442 Atrioventricular block, complete: Secondary | ICD-10-CM | POA: Diagnosis not present

## 2020-07-26 DIAGNOSIS — R609 Edema, unspecified: Secondary | ICD-10-CM | POA: Diagnosis not present

## 2020-07-26 DIAGNOSIS — E039 Hypothyroidism, unspecified: Secondary | ICD-10-CM | POA: Diagnosis not present

## 2020-07-26 DIAGNOSIS — I872 Venous insufficiency (chronic) (peripheral): Secondary | ICD-10-CM | POA: Diagnosis not present

## 2020-07-26 DIAGNOSIS — I10 Essential (primary) hypertension: Secondary | ICD-10-CM | POA: Diagnosis not present

## 2020-07-26 DIAGNOSIS — E1151 Type 2 diabetes mellitus with diabetic peripheral angiopathy without gangrene: Secondary | ICD-10-CM | POA: Diagnosis not present

## 2020-07-26 DIAGNOSIS — L89153 Pressure ulcer of sacral region, stage 3: Secondary | ICD-10-CM | POA: Diagnosis not present

## 2020-07-26 NOTE — Progress Notes (Signed)
[facility]  [address] [place],  [place]  [address] [phone]  Clinic Day:  07/27/2020  Referring physician: Ernestene Kiel, MD  This document serves as a record of services personally performed by Marice Potter, MD. It was created on their behalf by Curry,Lauren E, a trained medical scribe. The creation of this record is based on the scribe's personal observations and the provider's statements to them.  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  A bone marrow biopsy after 6 cycles of azacitidine fortunately showed that her leukemia cells had been eradicated.  She comes in today to be evaluated before heading into her 8th cycle of azacitidine this week.  The patient claims she has been mildly weaker over the past few weeks.  She has also noticed another skin lesion develop over her medial left calf.    PHYSICAL EXAM:  Blood pressure (!) 154/68, pulse 85, temperature 98.5 F (36.9 C), resp. rate 16, height _0  (1.549 m), weight 190 lb 4.8 oz (86.3 kg), SpO2 94 %. Wt Readings from Last 3 Encounters:  07/27/20 190 lb 4.8 oz (86.3 kg)  07/09/20 186 lb 4 oz (84.5 kg)  07/08/20 186 lb 12 oz (84.7 kg)   Body mass index is 35.96 kg/m. Performance status (ECOG): 1 Physical Exam Constitutional:      Appearance: Normal appearance. She is not ill-appearing.  HENT:     Mouth/Throat:     Mouth: Mucous membranes are moist.     Pharynx: Oropharynx is clear. No oropharyngeal exudate or posterior oropharyngeal erythema.  Cardiovascular:     Rate and Rhythm: Normal rate and regular rhythm.     Heart sounds: No murmur heard.   No friction rub. No gallop.  Pulmonary:     Effort: Pulmonary effort is normal. No respiratory distress.     Breath sounds: Normal breath sounds. No wheezing, rhonchi or rales.  Chest:  Breasts:    Right: No axillary adenopathy or supraclavicular adenopathy.      Left: No axillary adenopathy or supraclavicular adenopathy.  Abdominal:     General: Bowel sounds are normal. There is no distension.     Palpations: Abdomen is soft. There is no mass.     Tenderness: There is no abdominal tenderness.  Musculoskeletal:        General: No swelling.     Right lower leg: No edema.     Left lower leg: No edema.  Lymphadenopathy:     Cervical: No cervical adenopathy.     Upper Body:     Right upper body: No supraclavicular or axillary adenopathy.     Left upper body: No supraclavicular or axillary adenopathy.     Lower Body: No right inguinal adenopathy. No left inguinal adenopathy.  Skin:    General: Skin is warm.     Coloration: Skin is not jaundiced.     Findings: Lesion (skin breakdown in medial thigh, with mild erythema) present. No rash.  Neurological:     General: No focal deficit present.     Mental Status: She is alert and oriented to person, place, and time. Mental status is at baseline.     Cranial Nerves: Cranial nerves are intact.  Psychiatric:        Mood and Affect: Mood normal.        Behavior: Behavior normal.        Thought Content: Thought content normal.  LABS:   CBC Latest Ref Rng & Units 07/27/2020 06/29/2020 06/15/2020  WBC - 1.0 4.2 4.5  Hemoglobin 12.0 - 16.0 9.2(A) 10.6(A) 10.8(A)  Hematocrit 36 - 46 28(A) 33(A) 32(A)  Platelets 150 - 399 114(A) 209 220   CMP Latest Ref Rng & Units 07/27/2020 06/29/2020 06/15/2020  Glucose 65 - 99 mg/dL - - -  BUN 4 - 21 26(A) 22(A) 30(A)  Creatinine 0.5 - 1.1 0.9 0.9 1.3(A)  Sodium 137 - 147 136(A) 135(A) 136(A)  Potassium 3.4 - 5.3 4.1 3.7 4.0  Chloride 99 - 108 99 100 98(A)  CO2 13 - 22 25(A) 27(A) 27(A)  Calcium 8.7 - 10.7 8.8 8.6(A) 9.2  Alkaline Phos 25 - 125 - - 188(A)  AST 13 - 35 - - 29  ALT 7 - 35 - - 16    ASSESSMENT & PLAN:  Assessment/Plan:  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  When evaluating her labs today, I am concerned as all 3 cell lines are  noticeably lower than what they have been previously.  This always concerns me for her disease losing sensitivity to her azacitidine and potentially transforming into acute leukemia.  Of note, the patient has had her counts drop before, only for a bone marrow to show no evidence of leukemia cells being present.  The patient will proceed with her 8th cycle of azacitidine this week.  I will see her back in 4 weeks before her 9th cycle of azacitidine is given.  If her counts remain very low at that time, the patient understands a repeat bone marrow biopsy would be necessary to rule out transformation into secondary AML.  With respect to her left leg lesion/infection, I will prescribe her levofloxacin 750 mg daily x 7 days.  The patient understands all the plans discussed today and is in agreement with them.     I, Rita Ohara, am acting as scribe for Marice Potter, MD    I have reviewed this report as typed by the medical scribe, and it is complete and accurate.  Lexianna Weinrich Macarthur Critchley, MD

## 2020-07-27 ENCOUNTER — Other Ambulatory Visit: Payer: Self-pay | Admitting: Hematology and Oncology

## 2020-07-27 ENCOUNTER — Other Ambulatory Visit: Payer: Self-pay

## 2020-07-27 ENCOUNTER — Other Ambulatory Visit: Payer: Self-pay | Admitting: Oncology

## 2020-07-27 ENCOUNTER — Inpatient Hospital Stay: Payer: Medicare HMO

## 2020-07-27 ENCOUNTER — Inpatient Hospital Stay: Payer: Medicare HMO | Attending: Oncology | Admitting: Oncology

## 2020-07-27 VITALS — BP 154/68 | HR 85 | Temp 98.5°F | Resp 16 | Ht 61.0 in | Wt 190.3 lb

## 2020-07-27 DIAGNOSIS — D46Z Other myelodysplastic syndromes: Secondary | ICD-10-CM

## 2020-07-27 DIAGNOSIS — Z5111 Encounter for antineoplastic chemotherapy: Secondary | ICD-10-CM | POA: Insufficient documentation

## 2020-07-27 LAB — BASIC METABOLIC PANEL
BUN: 26 — AB (ref 4–21)
CO2: 25 — AB (ref 13–22)
Chloride: 99 (ref 99–108)
Creatinine: 0.9 (ref 0.5–1.1)
Glucose: 120
Potassium: 4.1 (ref 3.4–5.3)
Sodium: 136 — AB (ref 137–147)

## 2020-07-27 LAB — CBC AND DIFFERENTIAL
HCT: 28 — AB (ref 36–46)
Hemoglobin: 9.2 — AB (ref 12.0–16.0)
Neutrophils Absolute: 0.22
Platelets: 114 — AB (ref 150–399)
WBC: 1

## 2020-07-27 LAB — COMPREHENSIVE METABOLIC PANEL: Calcium: 8.8 (ref 8.7–10.7)

## 2020-07-27 LAB — CBC
MCV: 112 — AB (ref 81–99)
RBC: 2.52 — AB (ref 3.87–5.11)

## 2020-07-27 MED ORDER — LEVOFLOXACIN 750 MG PO TABS: 750.0000 mg | ORAL_TABLET | Freq: Every day | ORAL | 0 refills | Status: AC

## 2020-07-27 NOTE — Progress Notes (Unsigned)
t

## 2020-07-29 ENCOUNTER — Other Ambulatory Visit: Payer: Self-pay

## 2020-07-29 ENCOUNTER — Inpatient Hospital Stay: Payer: Medicare HMO

## 2020-07-29 VITALS — BP 152/47 | HR 73 | Temp 99.1°F | Resp 18 | Ht 61.0 in | Wt 188.0 lb

## 2020-07-29 DIAGNOSIS — D46Z Other myelodysplastic syndromes: Secondary | ICD-10-CM

## 2020-07-29 DIAGNOSIS — Z5111 Encounter for antineoplastic chemotherapy: Secondary | ICD-10-CM | POA: Diagnosis not present

## 2020-07-29 MED ORDER — AZACITIDINE CHEMO SQ INJECTION
75.0000 mg/m2 | Freq: Once | INTRAMUSCULAR | Status: AC
  Administered 2020-07-29: 145 mg via SUBCUTANEOUS
  Filled 2020-07-29: qty 5.8

## 2020-07-29 MED ORDER — ONDANSETRON HCL 8 MG PO TABS: 8.0000 mg | ORAL_TABLET | Freq: Once | ORAL | Status: DC

## 2020-07-29 NOTE — Patient Instructions (Addendum)
[facility]  Discharge Instructions: Thank you for choosing [facility] to provide your oncology and hematology care.  If you have a lab appointment with the [facility], please go directly to the [facility] and check in at the registration area.   Wear comfortable clothing and clothing appropriate for easy access to any Portacath or PICC line.   We strive to give you quality time with your provider. You may need to reschedule your appointment if you arrive late (15 or more minutes).  Arriving late affects you and other patients whose appointments are after yours.  Also, if you miss three or more appointments without notifying the office, you may be dismissed from the clinic at the provider's discretion.      For prescription refill requests, have your pharmacy contact our office and allow 72 hours for refills to be completed.    Today you received the following chemotherapy and/or immunotherapy agents Azacitadine     To help prevent nausea and vomiting after your treatment, we encourage you to take your nausea medication as directed.  BELOW ARE SYMPTOMS THAT SHOULD BE REPORTED IMMEDIATELY: *FEVER GREATER THAN 100.4 F (38 C) OR HIGHER *CHILLS OR SWEATING *NAUSEA AND VOMITING THAT IS NOT CONTROLLED WITH YOUR NAUSEA MEDICATION *UNUSUAL SHORTNESS OF BREATH *UNUSUAL BRUISING OR BLEEDING *URINARY PROBLEMS (pain or burning when urinating, or frequent urination) *BOWEL PROBLEMS (unusual diarrhea, constipation, pain near the anus) TENDERNESS IN MOUTH AND THROAT WITH OR WITHOUT PRESENCE OF ULCERS (sore throat, sores in mouth, or a toothache) UNUSUAL RASH, SWELLING OR PAIN  UNUSUAL VAGINAL DISCHARGE OR ITCHING   Items with * indicate a potential emergency and should be followed up as soon as possible or go to the Emergency Department if any problems should occur.  Please show the CHEMOTHERAPY ALERT CARD or IMMUNOTHERAPY ALERT CARD at check-in to the  Emergency Department and triage nurse.  Should you have questions after your visit or need to cancel or reschedule your appointment, please contact [facility]  Dept: [phone]  and follow the prompts.  Office hours are 8:00 a.m. to 4:30 p.m. Monday - Friday. Please note that voicemails left after 4:00 p.m. may not be returned until the following business day.  We are closed weekends and major holidays. You have access to a nurse at all times for urgent questions. Please call the main number to the clinic Dept: [phone] and follow the prompts.  For any non-urgent questions, you may also contact your provider using MyChart. We now offer e-Visits for anyone 80 and older to request care online for non-urgent symptoms. For details visit [email or web address].   Also download the MyChart app! Go to the app store, search "MyChart", open the app, select [facility], and log in with your MyChart username and password.  Due to Covid, a mask is required upon entering the hospital/clinic. If you do not have a mask, one will be given to you upon arrival. For doctor visits, patients may have 1 support person aged 45 or older with them. For treatment visits, patients cannot have anyone with them due to current Covid guidelines and our immunocompromised population.   [facility]  Discharge Instructions: Thank you for choosing [facility] to provide your oncology and hematology care.  If you have a lab appointment with the [facility], please go directly to the [facility] and check in at the registration area.   Wear  comfortable clothing and clothing appropriate for easy access to any Portacath or PICC line.   We strive to give you quality time with your provider. You may need to reschedule your appointment if you arrive late (15 or more minutes).  Arriving late affects you and other patients whose appointments are after yours.  Also, if  you miss three or more appointments without notifying the office, you may be dismissed from the clinic at the provider's discretion.      For prescription refill requests, have your pharmacy contact our office and allow 72 hours for refills to be completed.    Today you received the following chemotherapy and/or immunotherapy agents vidaza   To help prevent nausea and vomiting after your treatment, we encourage you to take your nausea medication as directed.  BELOW ARE SYMPTOMS THAT SHOULD BE REPORTED IMMEDIATELY: *FEVER GREATER THAN 100.4 F (38 C) OR HIGHER *CHILLS OR SWEATING *NAUSEA AND VOMITING THAT IS NOT CONTROLLED WITH YOUR NAUSEA MEDICATION *UNUSUAL SHORTNESS OF BREATH *UNUSUAL BRUISING OR BLEEDING *URINARY PROBLEMS (pain or burning when urinating, or frequent urination) *BOWEL PROBLEMS (unusual diarrhea, constipation, pain near the anus) TENDERNESS IN MOUTH AND THROAT WITH OR WITHOUT PRESENCE OF ULCERS (sore throat, sores in mouth, or a toothache) UNUSUAL RASH, SWELLING OR PAIN  UNUSUAL VAGINAL DISCHARGE OR ITCHING   Items with * indicate a potential emergency and should be followed up as soon as possible or go to the Emergency Department if any problems should occur.  Please show the CHEMOTHERAPY ALERT CARD or IMMUNOTHERAPY ALERT CARD at check-in to the Emergency Department and triage nurse.  Should you have questions after your visit or need to cancel or reschedule your appointment, please contact [facility]  Dept: [phone]  and follow the prompts.  Office hours are 8:00 a.m. to 4:30 p.m. Monday - Friday. Please note that voicemails left after 4:00 p.m. may not be returned until the following business day.  We are closed weekends and major holidays. You have access to a nurse at all times for urgent questions. Please call the main number to the clinic Dept: [phone] and follow the prompts.  For any non-urgent questions, you may also contact  your provider using MyChart. We now offer e-Visits for anyone 80 and older to request care online for non-urgent symptoms. For details visit [email or web address].   Also download the MyChart app! Go to the app store, search "MyChart", open the app, select [facility], and log in with your MyChart username and password.  Due to Covid, a mask is required upon entering the hospital/clinic. If you do not have a mask, one will be given to you upon arrival. For doctor visits, patients may have 1 support person aged 80 or older with them. For treatment visits, patients cannot have anyone with them due to current Covid guidelines and our immunocompromised population.   Azacitidine suspension for injection (subcutaneous use) What is this medication? AZACITIDINE (ay [place]) is a chemotherapy drug. This medicine reduces the growth of cancer cells and can suppress the immune system. It is used fortreating myelodysplastic syndrome or some types of leukemia. This medicine may be used for other purposes; ask your health care provider orpharmacist if you have questions. COMMON BRAND NAME(S): Vidaza What should I tell my care team before I take this medication? They need to know if you have any of these conditions: kidney disease liver disease liver tumors an unusual or allergic reaction to azacitidine, mannitol, other  medicines, foods, dyes, or preservatives pregnant or trying to get pregnant breast-feeding How should I use this medication? This medicine is for injection under the skin. It is administered in a hospitalor clinic by a specially trained health care professional. Talk to your pediatrician regarding the use of this medicine in children. Whilethis drug may be prescribed for selected conditions, precautions do apply. Overdosage: If you think you have taken too much of this medicine contact apoison control center or emergency room at once. NOTE: This medicine is only for you. Do not share  this medicine with others. What if I miss a dose? It is important not to miss your dose. Call your doctor or health careprofessional if you are unable to keep an appointment. What may interact with this medication? Interactions have not been studied. Give your health care provider a list of all the medicines, herbs, non-prescription drugs, or dietary supplements you use. Also tell them if you smoke, drink alcohol, or use illegal drugs. Some items may interact with yourmedicine. This list may not describe all possible interactions. Give your health care provider a list of all the medicines, herbs, non-prescription drugs, or dietary supplements you use. Also tell them if you smoke, drink alcohol, or use illegaldrugs. Some items may interact with your medicine. What should I watch for while using this medication? Visit your doctor for checks on your progress. This drug may make you feel generally unwell. This is not uncommon, as chemotherapy can affect healthy cells as well as cancer cells. Report any side effects. Continue your course oftreatment even though you feel ill unless your doctor tells you to stop. In some cases, you may be given additional medicines to help with side effects.Follow all directions for their use. Call your doctor or health care professional for advice if you get a fever, chills or sore throat, or other symptoms of a cold or flu. Do not treat yourself. This drug decreases your body's ability to fight infections. Try toavoid being around people who are sick. This medicine may increase your risk to bruise or bleed. Call your doctor orhealth care professional if you notice any unusual bleeding. You may need blood work done while you are taking this medicine. Do not become pregnant while taking this medicine and for 6 months after the last dose. Women should inform their doctor if they wish to become pregnant or think they might be pregnant. Men should not father a child while taking  this medicine and for 3 months after the last dose. There is a potential for serious side effects to an unborn child. Talk to your health care professional or pharmacist for more information. Do not breast-feed an infant while taking thismedicine and for 1 week after the last dose. This medicine may interfere with the ability to have a child. Talk with yourdoctor or health care professional if you are concerned about your fertility. What side effects may I notice from receiving this medication? Side effects that you should report to your doctor or health care professionalas soon as possible: allergic reactions like skin rash, itching or hives, swelling of the face, lips, or tongue low blood counts - this medicine may decrease the number of white blood cells, red blood cells and platelets. You may be at increased risk for infections and bleeding. signs of infection - fever or chills, cough, sore throat, pain passing urine signs of decreased platelets or bleeding - bruising, pinpoint red spots on the skin, black, tarry stools, blood in the urine  signs of decreased red blood cells - unusually weak or tired, fainting spells, lightheadedness signs and symptoms of kidney injury like trouble passing urine or change in the amount of urine signs and symptoms of liver injury like dark yellow or brown urine; general ill feeling or flu-like symptoms; light-colored stools; loss of appetite; nausea; right upper belly pain; unusually weak or tired; yellowing of the eyes or skin Side effects that usually do not require medical attention (report to yourdoctor or health care professional if they continue or are bothersome): constipation diarrhea nausea, vomiting pain or redness at the injection site unusually weak or tired This list may not describe all possible side effects. Call your doctor for medical advice about side effects. You may report side effects to FDA at1-800-FDA-1088. Where should I keep my  medication? This drug is given in a hospital or clinic and will not be stored at home. NOTE: This sheet is a summary. It may not cover all possible information. If you have questions about this medicine, talk to your doctor, pharmacist, orhealth care provider.  2022 Elsevier/Gold Standard (2016-02-01 14:37:51)

## 2020-07-30 ENCOUNTER — Encounter: Payer: Self-pay | Admitting: Oncology

## 2020-07-30 ENCOUNTER — Inpatient Hospital Stay: Payer: Medicare HMO

## 2020-07-30 VITALS — BP 164/51 | HR 70 | Temp 98.8°F | Resp 18 | Ht 61.0 in | Wt 188.0 lb

## 2020-07-30 DIAGNOSIS — E039 Hypothyroidism, unspecified: Secondary | ICD-10-CM | POA: Diagnosis not present

## 2020-07-30 DIAGNOSIS — I110 Hypertensive heart disease with heart failure: Secondary | ICD-10-CM | POA: Diagnosis not present

## 2020-07-30 DIAGNOSIS — Z5111 Encounter for antineoplastic chemotherapy: Secondary | ICD-10-CM | POA: Diagnosis not present

## 2020-07-30 DIAGNOSIS — I509 Heart failure, unspecified: Secondary | ICD-10-CM | POA: Diagnosis not present

## 2020-07-30 DIAGNOSIS — I4891 Unspecified atrial fibrillation: Secondary | ICD-10-CM | POA: Diagnosis not present

## 2020-07-30 DIAGNOSIS — K219 Gastro-esophageal reflux disease without esophagitis: Secondary | ICD-10-CM | POA: Diagnosis not present

## 2020-07-30 DIAGNOSIS — T8189XD Other complications of procedures, not elsewhere classified, subsequent encounter: Secondary | ICD-10-CM | POA: Diagnosis not present

## 2020-07-30 DIAGNOSIS — L89153 Pressure ulcer of sacral region, stage 3: Secondary | ICD-10-CM | POA: Diagnosis not present

## 2020-07-30 DIAGNOSIS — I872 Venous insufficiency (chronic) (peripheral): Secondary | ICD-10-CM | POA: Diagnosis not present

## 2020-07-30 DIAGNOSIS — E1151 Type 2 diabetes mellitus with diabetic peripheral angiopathy without gangrene: Secondary | ICD-10-CM | POA: Diagnosis not present

## 2020-07-30 DIAGNOSIS — D46Z Other myelodysplastic syndromes: Secondary | ICD-10-CM | POA: Diagnosis not present

## 2020-07-30 MED ORDER — AZACITIDINE CHEMO SQ INJECTION
75.0000 mg/m2 | Freq: Once | INTRAMUSCULAR | Status: AC
  Administered 2020-07-30: 145 mg via SUBCUTANEOUS
  Filled 2020-07-30: qty 5.8

## 2020-07-30 MED ORDER — ONDANSETRON HCL 8 MG PO TABS: 8.0000 mg | ORAL_TABLET | Freq: Once | ORAL | Status: DC

## 2020-07-30 NOTE — Patient Instructions (Signed)
[name]  Discharge Instructions: Thank you for choosing [facility] to provide your oncology and hematology care.  If you have a lab appointment with the [facility], please go directly to the [facility] and check in at the registration area.   Wear comfortable clothing and clothing appropriate for easy access to any Portacath or PICC line.   We strive to give you quality time with your provider. You may need to reschedule your appointment if you arrive late (15 or more minutes).  Arriving late affects you and other patients whose appointments are after yours.  Also, if you miss three or more appointments without notifying the office, you may be dismissed from the clinic at the provider's discretion.      For prescription refill requests, have your pharmacy contact our office and allow 72 hours for refills to be completed.    Today you received the following chemotherapy and/or immunotherapy agents vidaza     To help prevent nausea and vomiting after your treatment, we encourage you to take your nausea medication as directed.  BELOW ARE SYMPTOMS THAT SHOULD BE REPORTED IMMEDIATELY: *FEVER GREATER THAN 100.4 F (38 C) OR HIGHER *CHILLS OR SWEATING *NAUSEA AND VOMITING THAT IS NOT CONTROLLED WITH YOUR NAUSEA MEDICATION *UNUSUAL SHORTNESS OF BREATH *UNUSUAL BRUISING OR BLEEDING *URINARY PROBLEMS (pain or burning when urinating, or frequent urination) *BOWEL PROBLEMS (unusual diarrhea, constipation, pain near the anus) TENDERNESS IN MOUTH AND THROAT WITH OR WITHOUT PRESENCE OF ULCERS (sore throat, sores in mouth, or a toothache) UNUSUAL RASH, SWELLING OR PAIN  UNUSUAL VAGINAL DISCHARGE OR ITCHING   Items with * indicate a potential emergency and should be followed up as soon as possible or go to the Emergency Department if any problems should occur.  Please show the CHEMOTHERAPY ALERT CARD or IMMUNOTHERAPY ALERT CARD at check-in to the  Emergency Department and triage nurse.  Should you have questions after your visit or need to cancel or reschedule your appointment, please contact [facility]  Dept: [phone]  and follow the prompts.  Office hours are 8:00 a.m. to 4:30 p.m. Monday - Friday. Please note that voicemails left after 4:00 p.m. may not be returned until the following business day.  We are closed weekends and major holidays. You have access to a nurse at all times for urgent questions. Please call the main number to the clinic Dept: [phone] and follow the prompts.  For any non-urgent questions, you may also contact your provider using MyChart. We now offer e-Visits for anyone 7 and older to request care online for non-urgent symptoms. For details visit [email or web address].   Also download the MyChart app! Go to the app store, search "MyChart", open the app, select [facility], and log in with your MyChart username and password.  Due to Covid, a mask is required upon entering the hospital/clinic. If you do not have a mask, one will be given to you upon arrival. For doctor visits, patients may have 1 support person aged 63 or older with them. For treatment visits, patients cannot have anyone with them due to current Covid guidelines and our immunocompromised population.   Azacitidine suspension for injection (subcutaneous use) What is this medication? AZACITIDINE (ay [place]) is a chemotherapy drug. This medicine reduces the growth of cancer cells and can suppress the immune system. It is used fortreating myelodysplastic syndrome or some types of leukemia. This medicine may be used for other  purposes; ask your health care provider orpharmacist if you have questions. COMMON BRAND NAME(S): Vidaza What should I tell my care team before I take this medication? They need to know if you have any of these conditions: kidney disease liver disease liver tumors an unusual or allergic  reaction to azacitidine, mannitol, other medicines, foods, dyes, or preservatives pregnant or trying to get pregnant breast-feeding How should I use this medication? This medicine is for injection under the skin. It is administered in a hospitalor clinic by a specially trained health care professional. Talk to your pediatrician regarding the use of this medicine in children. Whilethis drug may be prescribed for selected conditions, precautions do apply. Overdosage: If you think you have taken too much of this medicine contact apoison control center or emergency room at once. NOTE: This medicine is only for you. Do not share this medicine with others. What if I miss a dose? It is important not to miss your dose. Call your doctor or health careprofessional if you are unable to keep an appointment. What may interact with this medication? Interactions have not been studied. Give your health care provider a list of all the medicines, herbs, non-prescription drugs, or dietary supplements you use. Also tell them if you smoke, drink alcohol, or use illegal drugs. Some items may interact with yourmedicine. This list may not describe all possible interactions. Give your health care provider a list of all the medicines, herbs, non-prescription drugs, or dietary supplements you use. Also tell them if you smoke, drink alcohol, or use illegaldrugs. Some items may interact with your medicine. What should I watch for while using this medication? Visit your doctor for checks on your progress. This drug may make you feel generally unwell. This is not uncommon, as chemotherapy can affect healthy cells as well as cancer cells. Report any side effects. Continue your course oftreatment even though you feel ill unless your doctor tells you to stop. In some cases, you may be given additional medicines to help with side effects.Follow all directions for their use. Call your doctor or health care professional for advice if  you get a fever, chills or sore throat, or other symptoms of a cold or flu. Do not treat yourself. This drug decreases your body's ability to fight infections. Try toavoid being around people who are sick. This medicine may increase your risk to bruise or bleed. Call your doctor orhealth care professional if you notice any unusual bleeding. You may need blood work done while you are taking this medicine. Do not become pregnant while taking this medicine and for 6 months after the last dose. Women should inform their doctor if they wish to become pregnant or think they might be pregnant. Men should not father a child while taking this medicine and for 3 months after the last dose. There is a potential for serious side effects to an unborn child. Talk to your health care professional or pharmacist for more information. Do not breast-feed an infant while taking thismedicine and for 1 week after the last dose. This medicine may interfere with the ability to have a child. Talk with yourdoctor or health care professional if you are concerned about your fertility. What side effects may I notice from receiving this medication? Side effects that you should report to your doctor or health care professionalas soon as possible: allergic reactions like skin rash, itching or hives, swelling of the face, lips, or tongue low blood counts - this medicine may decrease the number  of white blood cells, red blood cells and platelets. You may be at increased risk for infections and bleeding. signs of infection - fever or chills, cough, sore throat, pain passing urine signs of decreased platelets or bleeding - bruising, pinpoint red spots on the skin, black, tarry stools, blood in the urine signs of decreased red blood cells - unusually weak or tired, fainting spells, lightheadedness signs and symptoms of kidney injury like trouble passing urine or change in the amount of urine signs and symptoms of liver injury like dark  yellow or brown urine; general ill feeling or flu-like symptoms; light-colored stools; loss of appetite; nausea; right upper belly pain; unusually weak or tired; yellowing of the eyes or skin Side effects that usually do not require medical attention (report to yourdoctor or health care professional if they continue or are bothersome): constipation diarrhea nausea, vomiting pain or redness at the injection site unusually weak or tired This list may not describe all possible side effects. Call your doctor for medical advice about side effects. You may report side effects to FDA at1-800-FDA-1088. Where should I keep my medication? This drug is given in a hospital or clinic and will not be stored at home. NOTE: This sheet is a summary. It may not cover all possible information. If you have questions about this medicine, talk to your doctor, pharmacist, orhealth care provider.  2022 Elsevier/Gold Standard (2016-02-01 14:37:51)

## 2020-08-02 ENCOUNTER — Inpatient Hospital Stay: Payer: Medicare HMO

## 2020-08-02 ENCOUNTER — Other Ambulatory Visit: Payer: Self-pay

## 2020-08-02 VITALS — BP 150/67 | HR 70 | Temp 98.9°F | Resp 18 | Ht 61.0 in | Wt 190.0 lb

## 2020-08-02 DIAGNOSIS — I872 Venous insufficiency (chronic) (peripheral): Secondary | ICD-10-CM | POA: Diagnosis not present

## 2020-08-02 DIAGNOSIS — E1151 Type 2 diabetes mellitus with diabetic peripheral angiopathy without gangrene: Secondary | ICD-10-CM | POA: Diagnosis not present

## 2020-08-02 DIAGNOSIS — I4891 Unspecified atrial fibrillation: Secondary | ICD-10-CM | POA: Diagnosis not present

## 2020-08-02 DIAGNOSIS — K219 Gastro-esophageal reflux disease without esophagitis: Secondary | ICD-10-CM | POA: Diagnosis not present

## 2020-08-02 DIAGNOSIS — D46Z Other myelodysplastic syndromes: Secondary | ICD-10-CM

## 2020-08-02 DIAGNOSIS — T8189XD Other complications of procedures, not elsewhere classified, subsequent encounter: Secondary | ICD-10-CM | POA: Diagnosis not present

## 2020-08-02 DIAGNOSIS — I110 Hypertensive heart disease with heart failure: Secondary | ICD-10-CM | POA: Diagnosis not present

## 2020-08-02 DIAGNOSIS — L89153 Pressure ulcer of sacral region, stage 3: Secondary | ICD-10-CM | POA: Diagnosis not present

## 2020-08-02 DIAGNOSIS — E039 Hypothyroidism, unspecified: Secondary | ICD-10-CM | POA: Diagnosis not present

## 2020-08-02 DIAGNOSIS — Z5111 Encounter for antineoplastic chemotherapy: Secondary | ICD-10-CM | POA: Diagnosis not present

## 2020-08-02 DIAGNOSIS — I509 Heart failure, unspecified: Secondary | ICD-10-CM | POA: Diagnosis not present

## 2020-08-02 MED ORDER — AZACITIDINE CHEMO SQ INJECTION
75.0000 mg/m2 | Freq: Once | INTRAMUSCULAR | Status: AC
  Administered 2020-08-02: 145 mg via SUBCUTANEOUS
  Filled 2020-08-02: qty 5.8

## 2020-08-02 MED ORDER — ONDANSETRON HCL 8 MG PO TABS: 8.0000 mg | ORAL_TABLET | Freq: Once | ORAL | Status: DC

## 2020-08-02 MED FILL — Azacitidine For Inj 100 MG: INTRAMUSCULAR | Qty: 5.8 | Status: AC

## 2020-08-02 NOTE — Patient Instructions (Signed)
[facility]  Discharge Instructions: Thank you for choosing [facility] to provide your oncology and hematology care.  If you have a lab appointment with the [facility], please go directly to the [facility] and check in at the registration area.   Wear comfortable clothing and clothing appropriate for easy access to any Portacath or PICC line.   We strive to give you quality time with your provider. You may need to reschedule your appointment if you arrive late (15 or more minutes).  Arriving late affects you and other patients whose appointments are after yours.  Also, if you miss three or more appointments without notifying the office, you may be dismissed from the clinic at the provider's discretion.      For prescription refill requests, have your pharmacy contact our office and allow 72 hours for refills to be completed.    Today you received the following chemotherapy and/or immunotherapy agents vidaza   To help prevent nausea and vomiting after your treatment, we encourage you to take your nausea medication as directed.  BELOW ARE SYMPTOMS THAT SHOULD BE REPORTED IMMEDIATELY: *FEVER GREATER THAN 100.4 F (38 C) OR HIGHER *CHILLS OR SWEATING *NAUSEA AND VOMITING THAT IS NOT CONTROLLED WITH YOUR NAUSEA MEDICATION *UNUSUAL SHORTNESS OF BREATH *UNUSUAL BRUISING OR BLEEDING *URINARY PROBLEMS (pain or burning when urinating, or frequent urination) *BOWEL PROBLEMS (unusual diarrhea, constipation, pain near the anus) TENDERNESS IN MOUTH AND THROAT WITH OR WITHOUT PRESENCE OF ULCERS (sore throat, sores in mouth, or a toothache) UNUSUAL RASH, SWELLING OR PAIN  UNUSUAL VAGINAL DISCHARGE OR ITCHING   Items with * indicate a potential emergency and should be followed up as soon as possible or go to the Emergency Department if any problems should occur.  Please show the CHEMOTHERAPY ALERT CARD or IMMUNOTHERAPY ALERT CARD at check-in to the Emergency  Department and triage nurse.  Should you have questions after your visit or need to cancel or reschedule your appointment, please contact [facility]  Dept: [phone]  and follow the prompts.  Office hours are 8:00 a.m. to 4:30 p.m. Monday - Friday. Please note that voicemails left after 4:00 p.m. may not be returned until the following business day.  We are closed weekends and major holidays. You have access to a nurse at all times for urgent questions. Please call the main number to the clinic Dept: [phone] and follow the prompts.  For any non-urgent questions, you may also contact your provider using MyChart. We now offer e-Visits for anyone 53 and older to request care online for non-urgent symptoms. For details visit [email or web address].   Also download the MyChart app! Go to the app store, search "MyChart", open the app, select , and log in with your MyChart username and password.  Due to Covid, a mask is required upon entering the hospital/clinic. If you do not have a mask, one will be given to you upon arrival. For doctor visits, patients may have 1 support person aged 28 or older with them. For treatment visits, patients cannot have anyone with them due to current Covid guidelines and our immunocompromised population.   Azacitidine suspension for injection (subcutaneous use) What is this medication? AZACITIDINE (ay [place]) is a chemotherapy drug. This medicine reduces the growth of cancer cells and can suppress the immune system. It is used fortreating myelodysplastic syndrome or some types of leukemia. This medicine may be used for other purposes; ask  your health care provider orpharmacist if you have questions. COMMON BRAND NAME(S): Vidaza What should I tell my care team before I take this medication? They need to know if you have any of these conditions: kidney disease liver disease liver tumors an unusual or allergic reaction to  azacitidine, mannitol, other medicines, foods, dyes, or preservatives pregnant or trying to get pregnant breast-feeding How should I use this medication? This medicine is for injection under the skin. It is administered in a hospitalor clinic by a specially trained health care professional. Talk to your pediatrician regarding the use of this medicine in children. Whilethis drug may be prescribed for selected conditions, precautions do apply. Overdosage: If you think you have taken too much of this medicine contact apoison control center or emergency room at once. NOTE: This medicine is only for you. Do not share this medicine with others. What if I miss a dose? It is important not to miss your dose. Call your doctor or health careprofessional if you are unable to keep an appointment. What may interact with this medication? Interactions have not been studied. Give your health care provider a list of all the medicines, herbs, non-prescription drugs, or dietary supplements you use. Also tell them if you smoke, drink alcohol, or use illegal drugs. Some items may interact with yourmedicine. This list may not describe all possible interactions. Give your health care provider a list of all the medicines, herbs, non-prescription drugs, or dietary supplements you use. Also tell them if you smoke, drink alcohol, or use illegaldrugs. Some items may interact with your medicine. What should I watch for while using this medication? Visit your doctor for checks on your progress. This drug may make you feel generally unwell. This is not uncommon, as chemotherapy can affect healthy cells as well as cancer cells. Report any side effects. Continue your course oftreatment even though you feel ill unless your doctor tells you to stop. In some cases, you may be given additional medicines to help with side effects.Follow all directions for their use. Call your doctor or health care professional for advice if you get a  fever, chills or sore throat, or other symptoms of a cold or flu. Do not treat yourself. This drug decreases your body's ability to fight infections. Try toavoid being around people who are sick. This medicine may increase your risk to bruise or bleed. Call your doctor orhealth care professional if you notice any unusual bleeding. You may need blood work done while you are taking this medicine. Do not become pregnant while taking this medicine and for 6 months after the last dose. Women should inform their doctor if they wish to become pregnant or think they might be pregnant. Men should not father a child while taking this medicine and for 3 months after the last dose. There is a potential for serious side effects to an unborn child. Talk to your health care professional or pharmacist for more information. Do not breast-feed an infant while taking thismedicine and for 1 week after the last dose. This medicine may interfere with the ability to have a child. Talk with yourdoctor or health care professional if you are concerned about your fertility. What side effects may I notice from receiving this medication? Side effects that you should report to your doctor or health care professionalas soon as possible: allergic reactions like skin rash, itching or hives, swelling of the face, lips, or tongue low blood counts - this medicine may decrease the number of white  blood cells, red blood cells and platelets. You may be at increased risk for infections and bleeding. signs of infection - fever or chills, cough, sore throat, pain passing urine signs of decreased platelets or bleeding - bruising, pinpoint red spots on the skin, black, tarry stools, blood in the urine signs of decreased red blood cells - unusually weak or tired, fainting spells, lightheadedness signs and symptoms of kidney injury like trouble passing urine or change in the amount of urine signs and symptoms of liver injury like dark yellow or  brown urine; general ill feeling or flu-like symptoms; light-colored stools; loss of appetite; nausea; right upper belly pain; unusually weak or tired; yellowing of the eyes or skin Side effects that usually do not require medical attention (report to yourdoctor or health care professional if they continue or are bothersome): constipation diarrhea nausea, vomiting pain or redness at the injection site unusually weak or tired This list may not describe all possible side effects. Call your doctor for medical advice about side effects. You may report side effects to FDA at1-800-FDA-1088. Where should I keep my medication? This drug is given in a hospital or clinic and will not be stored at home. NOTE: This sheet is a summary. It may not cover all possible information. If you have questions about this medicine, talk to your doctor, pharmacist, orhealth care provider.  2022 Elsevier/Gold Standard (2016-02-01 14:37:51)

## 2020-08-03 ENCOUNTER — Inpatient Hospital Stay: Payer: Medicare HMO

## 2020-08-03 DIAGNOSIS — R918 Other nonspecific abnormal finding of lung field: Secondary | ICD-10-CM | POA: Diagnosis not present

## 2020-08-03 DIAGNOSIS — D649 Anemia, unspecified: Secondary | ICD-10-CM | POA: Diagnosis not present

## 2020-08-03 DIAGNOSIS — D46Z Other myelodysplastic syndromes: Secondary | ICD-10-CM | POA: Diagnosis not present

## 2020-08-03 DIAGNOSIS — M545 Low back pain, unspecified: Secondary | ICD-10-CM | POA: Diagnosis not present

## 2020-08-03 DIAGNOSIS — I517 Cardiomegaly: Secondary | ICD-10-CM | POA: Diagnosis not present

## 2020-08-03 DIAGNOSIS — R531 Weakness: Secondary | ICD-10-CM | POA: Diagnosis not present

## 2020-08-03 MED FILL — Azacitidine For Inj 100 MG: INTRAMUSCULAR | Qty: 5.8 | Status: AC

## 2020-08-04 ENCOUNTER — Encounter: Payer: Self-pay | Admitting: Oncology

## 2020-08-04 ENCOUNTER — Telehealth: Payer: Self-pay | Admitting: Oncology

## 2020-08-04 ENCOUNTER — Inpatient Hospital Stay: Payer: Medicare HMO

## 2020-08-04 NOTE — Telephone Encounter (Signed)
Patient stated she called this morning to CX Injection per Carlene

## 2020-08-04 NOTE — Progress Notes (Deleted)
I called patient to check on her. The patient cancelled her appointment today. The patient stated that she just could not come today she felt so bad. I asked if she needed to call the ambulance and she stated no. The patient is due to see her primary doctor on this Friday. I told the patient that I was very concerned and that she needed to see someone today. She stated that she just could not go anywhere today she just felt so bad. She promised me that she would call the ambulance if she did not get any better. She stated that she would make it to see her primary on Friday. I informed Ulice Dash, Pharmd that the patient may not make it the rest of the week to get her Vidaza. Dr Bobby Rumpf was informed of the above also.

## 2020-08-05 ENCOUNTER — Telehealth: Payer: Self-pay

## 2020-08-05 ENCOUNTER — Other Ambulatory Visit: Payer: Self-pay

## 2020-08-05 ENCOUNTER — Inpatient Hospital Stay: Payer: Medicare HMO

## 2020-08-05 VITALS — BP 152/78 | HR 73 | Temp 98.1°F | Resp 18 | Ht 61.0 in | Wt 193.0 lb

## 2020-08-05 DIAGNOSIS — Z5111 Encounter for antineoplastic chemotherapy: Secondary | ICD-10-CM | POA: Diagnosis not present

## 2020-08-05 DIAGNOSIS — D46Z Other myelodysplastic syndromes: Secondary | ICD-10-CM

## 2020-08-05 MED ORDER — AZACITIDINE CHEMO SQ INJECTION
75.0000 mg/m2 | Freq: Once | INTRAMUSCULAR | Status: AC
  Administered 2020-08-05: 145 mg via SUBCUTANEOUS
  Filled 2020-08-05: qty 5.8

## 2020-08-05 MED ORDER — ONDANSETRON HCL 8 MG PO TABS: 8.0000 mg | ORAL_TABLET | Freq: Once | ORAL | Status: DC

## 2020-08-05 MED FILL — Azacitidine For Inj 100 MG: INTRAMUSCULAR | Qty: 5.8 | Status: AC

## 2020-08-05 NOTE — Progress Notes (Signed)
1335 spoke with [name], pharm and she discussed with dr. Bobby Rumpf how pt was feeling really weak today and just not feeling well. Vitals stable. Getting labwork done, spoke with Evelena Peat, Education officer, museum about getting pt home health

## 2020-08-05 NOTE — Telephone Encounter (Signed)
Called patient to check on her. She stated that she felt better today. She stated that she would be in for her shot today.

## 2020-08-05 NOTE — Patient Instructions (Signed)
[facility]  Discharge Instructions: Thank you for choosing [facility] to provide your oncology and hematology care.  If you have a lab appointment with the [facility], please go directly to the [facility] and check in at the registration area.   Wear comfortable clothing and clothing appropriate for easy access to any Portacath or PICC line.   We strive to give you quality time with your provider. You may need to reschedule your appointment if you arrive late (15 or more minutes).  Arriving late affects you and other patients whose appointments are after yours.  Also, if you miss three or more appointments without notifying the office, you may be dismissed from the clinic at the provider's discretion.      For prescription refill requests, have your pharmacy contact our office and allow 72 hours for refills to be completed.    Today you received the following chemotherapy and/or immunotherapy agents vidaza     To help prevent nausea and vomiting after your treatment, we encourage you to take your nausea medication as directed.  BELOW ARE SYMPTOMS THAT SHOULD BE REPORTED IMMEDIATELY: *FEVER GREATER THAN 100.4 F (38 C) OR HIGHER *CHILLS OR SWEATING *NAUSEA AND VOMITING THAT IS NOT CONTROLLED WITH YOUR NAUSEA MEDICATION *UNUSUAL SHORTNESS OF BREATH *UNUSUAL BRUISING OR BLEEDING *URINARY PROBLEMS (pain or burning when urinating, or frequent urination) *BOWEL PROBLEMS (unusual diarrhea, constipation, pain near the anus) TENDERNESS IN MOUTH AND THROAT WITH OR WITHOUT PRESENCE OF ULCERS (sore throat, sores in mouth, or a toothache) UNUSUAL RASH, SWELLING OR PAIN  UNUSUAL VAGINAL DISCHARGE OR ITCHING   Items with * indicate a potential emergency and should be followed up as soon as possible or go to the Emergency Department if any problems should occur.  Please show the CHEMOTHERAPY ALERT CARD or IMMUNOTHERAPY ALERT CARD at check-in to the  Emergency Department and triage nurse.  Should you have questions after your visit or need to cancel or reschedule your appointment, please contact [facility]  Dept: [phone]  and follow the prompts.  Office hours are 8:00 a.m. to 4:30 p.m. Monday - Friday. Please note that voicemails left after 4:00 p.m. may not be returned until the following business day.  We are closed weekends and major holidays. You have access to a nurse at all times for urgent questions. Please call the main number to the clinic Dept: [phone] and follow the prompts.  For any non-urgent questions, you may also contact your provider using MyChart. We now offer e-Visits for anyone 78 and older to request care online for non-urgent symptoms. For details visit [email or web address].   Also download the MyChart app! Go to the app store, search "MyChart", open the app, select [facility], and log in with your MyChart username and password.  Due to Covid, a mask is required upon entering the hospital/clinic. If you do not have a mask, one will be given to you upon arrival. For doctor visits, patients may have 1 support person aged 70 or older with them. For treatment visits, patients cannot have anyone with them due to current Covid guidelines and our immunocompromised population.   Azacitidine suspension for injection (subcutaneous use) What is this medication? AZACITIDINE (ay [place]) is a chemotherapy drug. This medicine reduces the growth of cancer cells and can suppress the immune system. It is used fortreating myelodysplastic syndrome or some types of leukemia. This medicine may be used for other  purposes; ask your health care provider orpharmacist if you have questions. COMMON BRAND NAME(S): Vidaza What should I tell my care team before I take this medication? They need to know if you have any of these conditions: kidney disease liver disease liver tumors an unusual or allergic  reaction to azacitidine, mannitol, other medicines, foods, dyes, or preservatives pregnant or trying to get pregnant breast-feeding How should I use this medication? This medicine is for injection under the skin. It is administered in a hospitalor clinic by a specially trained health care professional. Talk to your pediatrician regarding the use of this medicine in children. Whilethis drug may be prescribed for selected conditions, precautions do apply. Overdosage: If you think you have taken too much of this medicine contact apoison control center or emergency room at once. NOTE: This medicine is only for you. Do not share this medicine with others. What if I miss a dose? It is important not to miss your dose. Call your doctor or health careprofessional if you are unable to keep an appointment. What may interact with this medication? Interactions have not been studied. Give your health care provider a list of all the medicines, herbs, non-prescription drugs, or dietary supplements you use. Also tell them if you smoke, drink alcohol, or use illegal drugs. Some items may interact with yourmedicine. This list may not describe all possible interactions. Give your health care provider a list of all the medicines, herbs, non-prescription drugs, or dietary supplements you use. Also tell them if you smoke, drink alcohol, or use illegaldrugs. Some items may interact with your medicine. What should I watch for while using this medication? Visit your doctor for checks on your progress. This drug may make you feel generally unwell. This is not uncommon, as chemotherapy can affect healthy cells as well as cancer cells. Report any side effects. Continue your course oftreatment even though you feel ill unless your doctor tells you to stop. In some cases, you may be given additional medicines to help with side effects.Follow all directions for their use. Call your doctor or health care professional for advice if  you get a fever, chills or sore throat, or other symptoms of a cold or flu. Do not treat yourself. This drug decreases your body's ability to fight infections. Try toavoid being around people who are sick. This medicine may increase your risk to bruise or bleed. Call your doctor orhealth care professional if you notice any unusual bleeding. You may need blood work done while you are taking this medicine. Do not become pregnant while taking this medicine and for 6 months after the last dose. Women should inform their doctor if they wish to become pregnant or think they might be pregnant. Men should not father a child while taking this medicine and for 3 months after the last dose. There is a potential for serious side effects to an unborn child. Talk to your health care professional or pharmacist for more information. Do not breast-feed an infant while taking thismedicine and for 1 week after the last dose. This medicine may interfere with the ability to have a child. Talk with yourdoctor or health care professional if you are concerned about your fertility. What side effects may I notice from receiving this medication? Side effects that you should report to your doctor or health care professionalas soon as possible: allergic reactions like skin rash, itching or hives, swelling of the face, lips, or tongue low blood counts - this medicine may decrease the number  of white blood cells, red blood cells and platelets. You may be at increased risk for infections and bleeding. signs of infection - fever or chills, cough, sore throat, pain passing urine signs of decreased platelets or bleeding - bruising, pinpoint red spots on the skin, black, tarry stools, blood in the urine signs of decreased red blood cells - unusually weak or tired, fainting spells, lightheadedness signs and symptoms of kidney injury like trouble passing urine or change in the amount of urine signs and symptoms of liver injury like dark  yellow or brown urine; general ill feeling or flu-like symptoms; light-colored stools; loss of appetite; nausea; right upper belly pain; unusually weak or tired; yellowing of the eyes or skin Side effects that usually do not require medical attention (report to yourdoctor or health care professional if they continue or are bothersome): constipation diarrhea nausea, vomiting pain or redness at the injection site unusually weak or tired This list may not describe all possible side effects. Call your doctor for medical advice about side effects. You may report side effects to FDA at1-800-FDA-1088. Where should I keep my medication? This drug is given in a hospital or clinic and will not be stored at home. NOTE: This sheet is a summary. It may not cover all possible information. If you have questions about this medicine, talk to your doctor, pharmacist, orhealth care provider.  2022 Elsevier/Gold Standard (2016-02-01 14:37:51)

## 2020-08-06 ENCOUNTER — Telehealth: Payer: Self-pay | Admitting: Oncology

## 2020-08-06 ENCOUNTER — Encounter: Payer: Self-pay | Admitting: Oncology

## 2020-08-06 ENCOUNTER — Inpatient Hospital Stay: Payer: Medicare HMO

## 2020-08-06 ENCOUNTER — Telehealth: Payer: Self-pay

## 2020-08-06 DIAGNOSIS — I872 Venous insufficiency (chronic) (peripheral): Secondary | ICD-10-CM | POA: Diagnosis not present

## 2020-08-06 DIAGNOSIS — I509 Heart failure, unspecified: Secondary | ICD-10-CM | POA: Diagnosis not present

## 2020-08-06 DIAGNOSIS — L89153 Pressure ulcer of sacral region, stage 3: Secondary | ICD-10-CM | POA: Diagnosis not present

## 2020-08-06 DIAGNOSIS — E1151 Type 2 diabetes mellitus with diabetic peripheral angiopathy without gangrene: Secondary | ICD-10-CM | POA: Diagnosis not present

## 2020-08-06 DIAGNOSIS — T8189XD Other complications of procedures, not elsewhere classified, subsequent encounter: Secondary | ICD-10-CM | POA: Diagnosis not present

## 2020-08-06 DIAGNOSIS — I110 Hypertensive heart disease with heart failure: Secondary | ICD-10-CM | POA: Diagnosis not present

## 2020-08-06 DIAGNOSIS — I4891 Unspecified atrial fibrillation: Secondary | ICD-10-CM | POA: Diagnosis not present

## 2020-08-06 DIAGNOSIS — E039 Hypothyroidism, unspecified: Secondary | ICD-10-CM | POA: Diagnosis not present

## 2020-08-06 DIAGNOSIS — K219 Gastro-esophageal reflux disease without esophagitis: Secondary | ICD-10-CM | POA: Diagnosis not present

## 2020-08-06 NOTE — Telephone Encounter (Signed)
Patient called infusion direct phone number requesting to speak with a nurse. Patient states she is unable to walk and is feeling very week. Spoke with Manuela Schwartz in regards to her weakness and recommended patient to go to the emergency room. Patient is canceling injection for this evening. Patient has been complaining of weakness and now is unable to do daily functions such as walking do to it. Patient is aware of how important it is for her to be seen in the emergency room. Patient states she will handle getting to the emergency room.

## 2020-08-09 DIAGNOSIS — I10 Essential (primary) hypertension: Secondary | ICD-10-CM | POA: Diagnosis not present

## 2020-08-09 DIAGNOSIS — Z0001 Encounter for general adult medical examination with abnormal findings: Secondary | ICD-10-CM | POA: Diagnosis not present

## 2020-08-09 DIAGNOSIS — E785 Hyperlipidemia, unspecified: Secondary | ICD-10-CM | POA: Diagnosis not present

## 2020-08-09 DIAGNOSIS — D6859 Other primary thrombophilia: Secondary | ICD-10-CM | POA: Diagnosis not present

## 2020-08-09 DIAGNOSIS — I480 Paroxysmal atrial fibrillation: Secondary | ICD-10-CM | POA: Diagnosis not present

## 2020-08-09 DIAGNOSIS — K5900 Constipation, unspecified: Secondary | ICD-10-CM | POA: Diagnosis not present

## 2020-08-09 DIAGNOSIS — Z1331 Encounter for screening for depression: Secondary | ICD-10-CM | POA: Diagnosis not present

## 2020-08-09 DIAGNOSIS — Z1339 Encounter for screening examination for other mental health and behavioral disorders: Secondary | ICD-10-CM | POA: Diagnosis not present

## 2020-08-09 DIAGNOSIS — R531 Weakness: Secondary | ICD-10-CM | POA: Diagnosis not present

## 2020-08-11 ENCOUNTER — Encounter: Payer: Self-pay | Admitting: Oncology

## 2020-08-11 DIAGNOSIS — E1151 Type 2 diabetes mellitus with diabetic peripheral angiopathy without gangrene: Secondary | ICD-10-CM | POA: Diagnosis not present

## 2020-08-11 DIAGNOSIS — I4891 Unspecified atrial fibrillation: Secondary | ICD-10-CM | POA: Diagnosis not present

## 2020-08-11 DIAGNOSIS — I872 Venous insufficiency (chronic) (peripheral): Secondary | ICD-10-CM | POA: Diagnosis not present

## 2020-08-11 DIAGNOSIS — L89153 Pressure ulcer of sacral region, stage 3: Secondary | ICD-10-CM | POA: Diagnosis not present

## 2020-08-11 DIAGNOSIS — I509 Heart failure, unspecified: Secondary | ICD-10-CM | POA: Diagnosis not present

## 2020-08-11 DIAGNOSIS — T8189XD Other complications of procedures, not elsewhere classified, subsequent encounter: Secondary | ICD-10-CM | POA: Diagnosis not present

## 2020-08-11 DIAGNOSIS — I110 Hypertensive heart disease with heart failure: Secondary | ICD-10-CM | POA: Diagnosis not present

## 2020-08-11 DIAGNOSIS — K219 Gastro-esophageal reflux disease without esophagitis: Secondary | ICD-10-CM | POA: Diagnosis not present

## 2020-08-11 DIAGNOSIS — E039 Hypothyroidism, unspecified: Secondary | ICD-10-CM | POA: Diagnosis not present

## 2020-08-11 NOTE — Progress Notes (Signed)
Spoke with Tammy, CMA, she made a referral to [facility]. Called patient and her nurse was there that changes her bandages on her wound. I spoke with her and she stated that she is going to have her office make a referral to a rehab facility. Asked that she call me with any updates and provided her with my number.

## 2020-08-13 DIAGNOSIS — M549 Dorsalgia, unspecified: Secondary | ICD-10-CM | POA: Diagnosis not present

## 2020-08-13 DIAGNOSIS — R1311 Dysphagia, oral phase: Secondary | ICD-10-CM | POA: Diagnosis not present

## 2020-08-13 DIAGNOSIS — I129 Hypertensive chronic kidney disease with stage 1 through stage 4 chronic kidney disease, or unspecified chronic kidney disease: Secondary | ICD-10-CM | POA: Diagnosis not present

## 2020-08-13 DIAGNOSIS — D469 Myelodysplastic syndrome, unspecified: Secondary | ICD-10-CM | POA: Diagnosis not present

## 2020-08-13 DIAGNOSIS — L89153 Pressure ulcer of sacral region, stage 3: Secondary | ICD-10-CM | POA: Diagnosis not present

## 2020-08-13 DIAGNOSIS — T8189XD Other complications of procedures, not elsewhere classified, subsequent encounter: Secondary | ICD-10-CM | POA: Diagnosis not present

## 2020-08-13 DIAGNOSIS — R2681 Unsteadiness on feet: Secondary | ICD-10-CM | POA: Diagnosis not present

## 2020-08-13 DIAGNOSIS — I110 Hypertensive heart disease with heart failure: Secondary | ICD-10-CM | POA: Diagnosis not present

## 2020-08-13 DIAGNOSIS — R079 Chest pain, unspecified: Secondary | ICD-10-CM | POA: Diagnosis not present

## 2020-08-13 DIAGNOSIS — Z7901 Long term (current) use of anticoagulants: Secondary | ICD-10-CM | POA: Diagnosis not present

## 2020-08-13 DIAGNOSIS — Z7401 Bed confinement status: Secondary | ICD-10-CM | POA: Diagnosis not present

## 2020-08-13 DIAGNOSIS — I509 Heart failure, unspecified: Secondary | ICD-10-CM | POA: Diagnosis not present

## 2020-08-13 DIAGNOSIS — M6281 Muscle weakness (generalized): Secondary | ICD-10-CM | POA: Diagnosis not present

## 2020-08-13 DIAGNOSIS — J90 Pleural effusion, not elsewhere classified: Secondary | ICD-10-CM | POA: Diagnosis not present

## 2020-08-13 DIAGNOSIS — M11231 Other chondrocalcinosis, right wrist: Secondary | ICD-10-CM | POA: Diagnosis not present

## 2020-08-13 DIAGNOSIS — R52 Pain, unspecified: Secondary | ICD-10-CM | POA: Diagnosis not present

## 2020-08-13 DIAGNOSIS — M19031 Primary osteoarthritis, right wrist: Secondary | ICD-10-CM | POA: Diagnosis not present

## 2020-08-13 DIAGNOSIS — D46Z Other myelodysplastic syndromes: Secondary | ICD-10-CM | POA: Diagnosis not present

## 2020-08-13 DIAGNOSIS — I6523 Occlusion and stenosis of bilateral carotid arteries: Secondary | ICD-10-CM | POA: Diagnosis not present

## 2020-08-13 DIAGNOSIS — E039 Hypothyroidism, unspecified: Secondary | ICD-10-CM | POA: Diagnosis not present

## 2020-08-13 DIAGNOSIS — Z7984 Long term (current) use of oral hypoglycemic drugs: Secondary | ICD-10-CM | POA: Diagnosis not present

## 2020-08-13 DIAGNOSIS — T148XXA Other injury of unspecified body region, initial encounter: Secondary | ICD-10-CM | POA: Diagnosis not present

## 2020-08-13 DIAGNOSIS — N189 Chronic kidney disease, unspecified: Secondary | ICD-10-CM | POA: Diagnosis not present

## 2020-08-13 DIAGNOSIS — D61818 Other pancytopenia: Secondary | ICD-10-CM | POA: Diagnosis not present

## 2020-08-13 DIAGNOSIS — M47812 Spondylosis without myelopathy or radiculopathy, cervical region: Secondary | ICD-10-CM | POA: Diagnosis not present

## 2020-08-13 DIAGNOSIS — R531 Weakness: Secondary | ICD-10-CM | POA: Diagnosis not present

## 2020-08-13 DIAGNOSIS — R5381 Other malaise: Secondary | ICD-10-CM | POA: Diagnosis not present

## 2020-08-13 DIAGNOSIS — M6282 Rhabdomyolysis: Secondary | ICD-10-CM | POA: Diagnosis not present

## 2020-08-13 DIAGNOSIS — J9811 Atelectasis: Secondary | ICD-10-CM | POA: Diagnosis not present

## 2020-08-13 DIAGNOSIS — I872 Venous insufficiency (chronic) (peripheral): Secondary | ICD-10-CM | POA: Diagnosis not present

## 2020-08-13 DIAGNOSIS — W19XXXA Unspecified fall, initial encounter: Secondary | ICD-10-CM | POA: Diagnosis not present

## 2020-08-13 DIAGNOSIS — I4891 Unspecified atrial fibrillation: Secondary | ICD-10-CM | POA: Diagnosis not present

## 2020-08-13 DIAGNOSIS — Z043 Encounter for examination and observation following other accident: Secondary | ICD-10-CM | POA: Diagnosis not present

## 2020-08-13 DIAGNOSIS — S50311A Abrasion of right elbow, initial encounter: Secondary | ICD-10-CM | POA: Diagnosis not present

## 2020-08-13 DIAGNOSIS — K219 Gastro-esophageal reflux disease without esophagitis: Secondary | ICD-10-CM | POA: Diagnosis not present

## 2020-08-13 DIAGNOSIS — E1151 Type 2 diabetes mellitus with diabetic peripheral angiopathy without gangrene: Secondary | ICD-10-CM | POA: Diagnosis not present

## 2020-08-13 DIAGNOSIS — M542 Cervicalgia: Secondary | ICD-10-CM | POA: Diagnosis not present

## 2020-08-13 DIAGNOSIS — I130 Hypertensive heart and chronic kidney disease with heart failure and stage 1 through stage 4 chronic kidney disease, or unspecified chronic kidney disease: Secondary | ICD-10-CM | POA: Diagnosis not present

## 2020-08-13 DIAGNOSIS — L89154 Pressure ulcer of sacral region, stage 4: Secondary | ICD-10-CM | POA: Diagnosis not present

## 2020-08-13 DIAGNOSIS — I480 Paroxysmal atrial fibrillation: Secondary | ICD-10-CM | POA: Diagnosis not present

## 2020-08-14 DIAGNOSIS — I4891 Unspecified atrial fibrillation: Secondary | ICD-10-CM | POA: Diagnosis not present

## 2020-08-14 DIAGNOSIS — D46Z Other myelodysplastic syndromes: Secondary | ICD-10-CM | POA: Diagnosis not present

## 2020-08-14 DIAGNOSIS — I509 Heart failure, unspecified: Secondary | ICD-10-CM | POA: Diagnosis not present

## 2020-08-14 DIAGNOSIS — L89153 Pressure ulcer of sacral region, stage 3: Secondary | ICD-10-CM | POA: Diagnosis not present

## 2020-08-14 DIAGNOSIS — E1151 Type 2 diabetes mellitus with diabetic peripheral angiopathy without gangrene: Secondary | ICD-10-CM | POA: Diagnosis not present

## 2020-08-14 DIAGNOSIS — I872 Venous insufficiency (chronic) (peripheral): Secondary | ICD-10-CM | POA: Diagnosis not present

## 2020-08-14 DIAGNOSIS — T8189XD Other complications of procedures, not elsewhere classified, subsequent encounter: Secondary | ICD-10-CM | POA: Diagnosis not present

## 2020-08-14 DIAGNOSIS — I110 Hypertensive heart disease with heart failure: Secondary | ICD-10-CM | POA: Diagnosis not present

## 2020-08-15 DIAGNOSIS — I4891 Unspecified atrial fibrillation: Secondary | ICD-10-CM | POA: Diagnosis not present

## 2020-08-15 DIAGNOSIS — I872 Venous insufficiency (chronic) (peripheral): Secondary | ICD-10-CM | POA: Diagnosis not present

## 2020-08-15 DIAGNOSIS — E1151 Type 2 diabetes mellitus with diabetic peripheral angiopathy without gangrene: Secondary | ICD-10-CM | POA: Diagnosis not present

## 2020-08-15 DIAGNOSIS — D46Z Other myelodysplastic syndromes: Secondary | ICD-10-CM | POA: Diagnosis not present

## 2020-08-15 DIAGNOSIS — I509 Heart failure, unspecified: Secondary | ICD-10-CM | POA: Diagnosis not present

## 2020-08-15 DIAGNOSIS — L89153 Pressure ulcer of sacral region, stage 3: Secondary | ICD-10-CM | POA: Diagnosis not present

## 2020-08-15 DIAGNOSIS — E039 Hypothyroidism, unspecified: Secondary | ICD-10-CM | POA: Diagnosis not present

## 2020-08-15 DIAGNOSIS — I110 Hypertensive heart disease with heart failure: Secondary | ICD-10-CM | POA: Diagnosis not present

## 2020-08-15 DIAGNOSIS — T8189XD Other complications of procedures, not elsewhere classified, subsequent encounter: Secondary | ICD-10-CM | POA: Diagnosis not present

## 2020-08-16 DIAGNOSIS — D46Z Other myelodysplastic syndromes: Secondary | ICD-10-CM | POA: Diagnosis not present

## 2020-08-18 DIAGNOSIS — D46Z Other myelodysplastic syndromes: Secondary | ICD-10-CM | POA: Diagnosis not present

## 2020-08-23 ENCOUNTER — Other Ambulatory Visit: Payer: Medicare HMO

## 2020-08-23 ENCOUNTER — Ambulatory Visit: Payer: Medicare HMO | Admitting: Oncology

## 2020-08-23 DIAGNOSIS — R1311 Dysphagia, oral phase: Secondary | ICD-10-CM | POA: Diagnosis not present

## 2020-08-23 DIAGNOSIS — I10 Essential (primary) hypertension: Secondary | ICD-10-CM | POA: Diagnosis not present

## 2020-08-23 DIAGNOSIS — L89153 Pressure ulcer of sacral region, stage 3: Secondary | ICD-10-CM | POA: Diagnosis not present

## 2020-08-23 DIAGNOSIS — Y998 Other external cause status: Secondary | ICD-10-CM | POA: Diagnosis not present

## 2020-08-23 DIAGNOSIS — T8189XD Other complications of procedures, not elsewhere classified, subsequent encounter: Secondary | ICD-10-CM | POA: Diagnosis not present

## 2020-08-23 DIAGNOSIS — G8929 Other chronic pain: Secondary | ICD-10-CM | POA: Diagnosis not present

## 2020-08-23 DIAGNOSIS — M549 Dorsalgia, unspecified: Secondary | ICD-10-CM | POA: Diagnosis not present

## 2020-08-23 DIAGNOSIS — M6281 Muscle weakness (generalized): Secondary | ICD-10-CM | POA: Diagnosis not present

## 2020-08-23 DIAGNOSIS — F32A Depression, unspecified: Secondary | ICD-10-CM | POA: Diagnosis not present

## 2020-08-23 DIAGNOSIS — R0902 Hypoxemia: Secondary | ICD-10-CM | POA: Diagnosis not present

## 2020-08-23 DIAGNOSIS — D72819 Decreased white blood cell count, unspecified: Secondary | ICD-10-CM | POA: Diagnosis not present

## 2020-08-23 DIAGNOSIS — R509 Fever, unspecified: Secondary | ICD-10-CM | POA: Diagnosis not present

## 2020-08-23 DIAGNOSIS — L89154 Pressure ulcer of sacral region, stage 4: Secondary | ICD-10-CM | POA: Diagnosis not present

## 2020-08-23 DIAGNOSIS — S40011A Contusion of right shoulder, initial encounter: Secondary | ICD-10-CM | POA: Diagnosis not present

## 2020-08-23 DIAGNOSIS — D709 Neutropenia, unspecified: Secondary | ICD-10-CM | POA: Diagnosis not present

## 2020-08-23 DIAGNOSIS — M6282 Rhabdomyolysis: Secondary | ICD-10-CM | POA: Diagnosis not present

## 2020-08-23 DIAGNOSIS — D649 Anemia, unspecified: Secondary | ICD-10-CM | POA: Diagnosis not present

## 2020-08-23 DIAGNOSIS — E1151 Type 2 diabetes mellitus with diabetic peripheral angiopathy without gangrene: Secondary | ICD-10-CM | POA: Diagnosis not present

## 2020-08-23 DIAGNOSIS — R5381 Other malaise: Secondary | ICD-10-CM | POA: Diagnosis not present

## 2020-08-23 DIAGNOSIS — E039 Hypothyroidism, unspecified: Secondary | ICD-10-CM | POA: Diagnosis not present

## 2020-08-23 DIAGNOSIS — Z20822 Contact with and (suspected) exposure to covid-19: Secondary | ICD-10-CM | POA: Diagnosis not present

## 2020-08-23 DIAGNOSIS — S31000A Unspecified open wound of lower back and pelvis without penetration into retroperitoneum, initial encounter: Secondary | ICD-10-CM | POA: Diagnosis not present

## 2020-08-23 DIAGNOSIS — S0083XA Contusion of other part of head, initial encounter: Secondary | ICD-10-CM | POA: Diagnosis not present

## 2020-08-23 DIAGNOSIS — S0990XA Unspecified injury of head, initial encounter: Secondary | ICD-10-CM | POA: Diagnosis not present

## 2020-08-23 DIAGNOSIS — I872 Venous insufficiency (chronic) (peripheral): Secondary | ICD-10-CM | POA: Diagnosis not present

## 2020-08-23 DIAGNOSIS — D469 Myelodysplastic syndrome, unspecified: Secondary | ICD-10-CM | POA: Diagnosis not present

## 2020-08-23 DIAGNOSIS — Z7401 Bed confinement status: Secondary | ICD-10-CM | POA: Diagnosis not present

## 2020-08-23 DIAGNOSIS — W1839XA Other fall on same level, initial encounter: Secondary | ICD-10-CM | POA: Diagnosis not present

## 2020-08-23 DIAGNOSIS — S0003XA Contusion of scalp, initial encounter: Secondary | ICD-10-CM | POA: Diagnosis not present

## 2020-08-23 DIAGNOSIS — D61818 Other pancytopenia: Secondary | ICD-10-CM | POA: Diagnosis not present

## 2020-08-23 DIAGNOSIS — N179 Acute kidney failure, unspecified: Secondary | ICD-10-CM | POA: Diagnosis not present

## 2020-08-23 DIAGNOSIS — I509 Heart failure, unspecified: Secondary | ICD-10-CM | POA: Diagnosis not present

## 2020-08-23 DIAGNOSIS — M25519 Pain in unspecified shoulder: Secondary | ICD-10-CM | POA: Diagnosis not present

## 2020-08-23 DIAGNOSIS — Z45018 Encounter for adjustment and management of other part of cardiac pacemaker: Secondary | ICD-10-CM | POA: Diagnosis not present

## 2020-08-23 DIAGNOSIS — K219 Gastro-esophageal reflux disease without esophagitis: Secondary | ICD-10-CM | POA: Diagnosis not present

## 2020-08-23 DIAGNOSIS — R739 Hyperglycemia, unspecified: Secondary | ICD-10-CM | POA: Diagnosis not present

## 2020-08-23 DIAGNOSIS — I4891 Unspecified atrial fibrillation: Secondary | ICD-10-CM | POA: Diagnosis not present

## 2020-08-23 DIAGNOSIS — I110 Hypertensive heart disease with heart failure: Secondary | ICD-10-CM | POA: Diagnosis not present

## 2020-08-23 DIAGNOSIS — E119 Type 2 diabetes mellitus without complications: Secondary | ICD-10-CM | POA: Diagnosis not present

## 2020-08-23 DIAGNOSIS — R2681 Unsteadiness on feet: Secondary | ICD-10-CM | POA: Diagnosis not present

## 2020-08-24 ENCOUNTER — Inpatient Hospital Stay: Payer: Medicare HMO | Attending: Oncology

## 2020-08-24 ENCOUNTER — Ambulatory Visit: Payer: Medicare HMO | Admitting: Oncology

## 2020-08-24 DIAGNOSIS — D649 Anemia, unspecified: Secondary | ICD-10-CM | POA: Diagnosis not present

## 2020-08-24 DIAGNOSIS — E119 Type 2 diabetes mellitus without complications: Secondary | ICD-10-CM | POA: Diagnosis not present

## 2020-08-24 DIAGNOSIS — G8929 Other chronic pain: Secondary | ICD-10-CM | POA: Diagnosis not present

## 2020-08-26 ENCOUNTER — Inpatient Hospital Stay: Payer: Medicare HMO

## 2020-08-27 ENCOUNTER — Inpatient Hospital Stay: Payer: Medicare HMO

## 2020-08-27 DIAGNOSIS — D709 Neutropenia, unspecified: Secondary | ICD-10-CM | POA: Diagnosis not present

## 2020-08-27 DIAGNOSIS — F32A Depression, unspecified: Secondary | ICD-10-CM | POA: Diagnosis not present

## 2020-08-27 DIAGNOSIS — K219 Gastro-esophageal reflux disease without esophagitis: Secondary | ICD-10-CM | POA: Diagnosis not present

## 2020-08-27 DIAGNOSIS — D649 Anemia, unspecified: Secondary | ICD-10-CM | POA: Diagnosis not present

## 2020-08-27 DIAGNOSIS — D469 Myelodysplastic syndrome, unspecified: Secondary | ICD-10-CM | POA: Diagnosis not present

## 2020-08-27 DIAGNOSIS — E119 Type 2 diabetes mellitus without complications: Secondary | ICD-10-CM | POA: Diagnosis not present

## 2020-08-30 ENCOUNTER — Inpatient Hospital Stay: Payer: Medicare HMO

## 2020-08-31 ENCOUNTER — Inpatient Hospital Stay: Payer: Medicare HMO

## 2020-09-01 ENCOUNTER — Ambulatory Visit: Payer: Medicare HMO

## 2020-09-02 ENCOUNTER — Ambulatory Visit: Payer: Medicare HMO

## 2020-09-02 DIAGNOSIS — I4891 Unspecified atrial fibrillation: Secondary | ICD-10-CM | POA: Diagnosis not present

## 2020-09-02 DIAGNOSIS — L89154 Pressure ulcer of sacral region, stage 4: Secondary | ICD-10-CM | POA: Diagnosis not present

## 2020-09-02 DIAGNOSIS — I509 Heart failure, unspecified: Secondary | ICD-10-CM | POA: Diagnosis not present

## 2020-09-02 DIAGNOSIS — I10 Essential (primary) hypertension: Secondary | ICD-10-CM | POA: Diagnosis not present

## 2020-09-03 ENCOUNTER — Ambulatory Visit: Payer: Medicare HMO

## 2020-09-06 DIAGNOSIS — D709 Neutropenia, unspecified: Secondary | ICD-10-CM | POA: Diagnosis not present

## 2020-09-06 DIAGNOSIS — R5381 Other malaise: Secondary | ICD-10-CM | POA: Diagnosis not present

## 2020-09-06 DIAGNOSIS — D649 Anemia, unspecified: Secondary | ICD-10-CM | POA: Diagnosis not present

## 2020-09-06 DIAGNOSIS — D469 Myelodysplastic syndrome, unspecified: Secondary | ICD-10-CM | POA: Diagnosis not present

## 2020-09-06 DIAGNOSIS — I10 Essential (primary) hypertension: Secondary | ICD-10-CM | POA: Diagnosis not present

## 2020-09-06 DIAGNOSIS — I4891 Unspecified atrial fibrillation: Secondary | ICD-10-CM | POA: Diagnosis not present

## 2020-09-06 DIAGNOSIS — L89154 Pressure ulcer of sacral region, stage 4: Secondary | ICD-10-CM | POA: Diagnosis not present

## 2020-09-06 DIAGNOSIS — G8929 Other chronic pain: Secondary | ICD-10-CM | POA: Diagnosis not present

## 2020-09-13 DIAGNOSIS — I509 Heart failure, unspecified: Secondary | ICD-10-CM | POA: Diagnosis not present

## 2020-09-13 DIAGNOSIS — R5381 Other malaise: Secondary | ICD-10-CM | POA: Diagnosis not present

## 2020-09-13 DIAGNOSIS — I4891 Unspecified atrial fibrillation: Secondary | ICD-10-CM | POA: Diagnosis not present

## 2020-09-13 DIAGNOSIS — L89154 Pressure ulcer of sacral region, stage 4: Secondary | ICD-10-CM | POA: Diagnosis not present

## 2020-09-13 DIAGNOSIS — D469 Myelodysplastic syndrome, unspecified: Secondary | ICD-10-CM | POA: Diagnosis not present

## 2020-09-13 DIAGNOSIS — D649 Anemia, unspecified: Secondary | ICD-10-CM | POA: Diagnosis not present

## 2020-09-14 DIAGNOSIS — I872 Venous insufficiency (chronic) (peripheral): Secondary | ICD-10-CM | POA: Diagnosis not present

## 2020-09-14 DIAGNOSIS — L89153 Pressure ulcer of sacral region, stage 3: Secondary | ICD-10-CM | POA: Diagnosis not present

## 2020-09-14 DIAGNOSIS — I509 Heart failure, unspecified: Secondary | ICD-10-CM | POA: Diagnosis not present

## 2020-09-14 DIAGNOSIS — I110 Hypertensive heart disease with heart failure: Secondary | ICD-10-CM | POA: Diagnosis not present

## 2020-09-14 DIAGNOSIS — E039 Hypothyroidism, unspecified: Secondary | ICD-10-CM | POA: Diagnosis not present

## 2020-09-14 DIAGNOSIS — E1151 Type 2 diabetes mellitus with diabetic peripheral angiopathy without gangrene: Secondary | ICD-10-CM | POA: Diagnosis not present

## 2020-09-14 DIAGNOSIS — I4891 Unspecified atrial fibrillation: Secondary | ICD-10-CM | POA: Diagnosis not present

## 2020-09-14 DIAGNOSIS — T8189XD Other complications of procedures, not elsewhere classified, subsequent encounter: Secondary | ICD-10-CM | POA: Diagnosis not present

## 2020-09-21 DIAGNOSIS — S31000A Unspecified open wound of lower back and pelvis without penetration into retroperitoneum, initial encounter: Secondary | ICD-10-CM | POA: Diagnosis not present

## 2020-09-21 DIAGNOSIS — R509 Fever, unspecified: Secondary | ICD-10-CM | POA: Diagnosis not present

## 2020-09-21 DIAGNOSIS — N179 Acute kidney failure, unspecified: Secondary | ICD-10-CM | POA: Diagnosis not present

## 2020-09-21 DIAGNOSIS — K5730 Diverticulosis of large intestine without perforation or abscess without bleeding: Secondary | ICD-10-CM | POA: Diagnosis not present

## 2020-09-21 DIAGNOSIS — M25519 Pain in unspecified shoulder: Secondary | ICD-10-CM | POA: Diagnosis not present

## 2020-09-21 DIAGNOSIS — I4891 Unspecified atrial fibrillation: Secondary | ICD-10-CM | POA: Diagnosis not present

## 2020-09-21 DIAGNOSIS — M19011 Primary osteoarthritis, right shoulder: Secondary | ICD-10-CM | POA: Diagnosis not present

## 2020-09-21 DIAGNOSIS — R0902 Hypoxemia: Secondary | ICD-10-CM | POA: Diagnosis not present

## 2020-09-21 DIAGNOSIS — S4991XA Unspecified injury of right shoulder and upper arm, initial encounter: Secondary | ICD-10-CM | POA: Diagnosis not present

## 2020-09-21 DIAGNOSIS — Z20822 Contact with and (suspected) exposure to covid-19: Secondary | ICD-10-CM | POA: Diagnosis not present

## 2020-09-21 DIAGNOSIS — W1839XA Other fall on same level, initial encounter: Secondary | ICD-10-CM | POA: Diagnosis not present

## 2020-09-21 DIAGNOSIS — R739 Hyperglycemia, unspecified: Secondary | ICD-10-CM | POA: Diagnosis not present

## 2020-09-21 DIAGNOSIS — K8689 Other specified diseases of pancreas: Secondary | ICD-10-CM | POA: Diagnosis not present

## 2020-09-21 DIAGNOSIS — S0083XA Contusion of other part of head, initial encounter: Secondary | ICD-10-CM | POA: Diagnosis not present

## 2020-09-21 DIAGNOSIS — S0990XA Unspecified injury of head, initial encounter: Secondary | ICD-10-CM | POA: Diagnosis not present

## 2020-09-21 DIAGNOSIS — N3289 Other specified disorders of bladder: Secondary | ICD-10-CM | POA: Diagnosis not present

## 2020-09-21 DIAGNOSIS — S0003XA Contusion of scalp, initial encounter: Secondary | ICD-10-CM | POA: Diagnosis not present

## 2020-09-21 DIAGNOSIS — D61818 Other pancytopenia: Secondary | ICD-10-CM | POA: Diagnosis not present

## 2020-09-21 DIAGNOSIS — D469 Myelodysplastic syndrome, unspecified: Secondary | ICD-10-CM | POA: Diagnosis not present

## 2020-09-21 DIAGNOSIS — Y998 Other external cause status: Secondary | ICD-10-CM | POA: Diagnosis not present

## 2020-09-21 DIAGNOSIS — N281 Cyst of kidney, acquired: Secondary | ICD-10-CM | POA: Diagnosis not present

## 2020-09-21 DIAGNOSIS — S40011A Contusion of right shoulder, initial encounter: Secondary | ICD-10-CM | POA: Diagnosis not present

## 2020-09-21 DIAGNOSIS — E119 Type 2 diabetes mellitus without complications: Secondary | ICD-10-CM | POA: Diagnosis not present

## 2020-09-22 DIAGNOSIS — M19011 Primary osteoarthritis, right shoulder: Secondary | ICD-10-CM | POA: Diagnosis not present

## 2020-09-22 DIAGNOSIS — E119 Type 2 diabetes mellitus without complications: Secondary | ICD-10-CM | POA: Diagnosis not present

## 2020-09-22 DIAGNOSIS — D61818 Other pancytopenia: Secondary | ICD-10-CM | POA: Diagnosis not present

## 2020-09-22 DIAGNOSIS — E785 Hyperlipidemia, unspecified: Secondary | ICD-10-CM | POA: Diagnosis not present

## 2020-09-22 DIAGNOSIS — N179 Acute kidney failure, unspecified: Secondary | ICD-10-CM | POA: Diagnosis not present

## 2020-09-22 DIAGNOSIS — K8689 Other specified diseases of pancreas: Secondary | ICD-10-CM | POA: Diagnosis not present

## 2020-09-22 DIAGNOSIS — K219 Gastro-esophageal reflux disease without esophagitis: Secondary | ICD-10-CM | POA: Diagnosis not present

## 2020-09-22 DIAGNOSIS — D469 Myelodysplastic syndrome, unspecified: Secondary | ICD-10-CM | POA: Diagnosis not present

## 2020-09-22 DIAGNOSIS — N281 Cyst of kidney, acquired: Secondary | ICD-10-CM | POA: Diagnosis not present

## 2020-09-22 DIAGNOSIS — N3289 Other specified disorders of bladder: Secondary | ICD-10-CM | POA: Diagnosis not present

## 2020-09-22 DIAGNOSIS — D696 Thrombocytopenia, unspecified: Secondary | ICD-10-CM | POA: Diagnosis not present

## 2020-09-22 DIAGNOSIS — Z7984 Long term (current) use of oral hypoglycemic drugs: Secondary | ICD-10-CM | POA: Diagnosis not present

## 2020-09-22 DIAGNOSIS — W19XXXA Unspecified fall, initial encounter: Secondary | ICD-10-CM | POA: Diagnosis not present

## 2020-09-22 DIAGNOSIS — I4891 Unspecified atrial fibrillation: Secondary | ICD-10-CM | POA: Diagnosis not present

## 2020-09-22 DIAGNOSIS — K5730 Diverticulosis of large intestine without perforation or abscess without bleeding: Secondary | ICD-10-CM | POA: Diagnosis not present

## 2020-09-22 DIAGNOSIS — S4991XA Unspecified injury of right shoulder and upper arm, initial encounter: Secondary | ICD-10-CM | POA: Diagnosis not present

## 2020-09-23 DIAGNOSIS — E119 Type 2 diabetes mellitus without complications: Secondary | ICD-10-CM | POA: Diagnosis not present

## 2020-09-23 DIAGNOSIS — D61818 Other pancytopenia: Secondary | ICD-10-CM | POA: Diagnosis not present

## 2020-09-23 DIAGNOSIS — N179 Acute kidney failure, unspecified: Secondary | ICD-10-CM | POA: Diagnosis not present

## 2020-09-23 DIAGNOSIS — G8929 Other chronic pain: Secondary | ICD-10-CM | POA: Diagnosis not present

## 2020-09-23 DIAGNOSIS — Z7984 Long term (current) use of oral hypoglycemic drugs: Secondary | ICD-10-CM | POA: Diagnosis not present

## 2020-09-23 DIAGNOSIS — D469 Myelodysplastic syndrome, unspecified: Secondary | ICD-10-CM | POA: Diagnosis not present

## 2020-09-23 DIAGNOSIS — W19XXXA Unspecified fall, initial encounter: Secondary | ICD-10-CM | POA: Diagnosis not present

## 2020-09-23 DIAGNOSIS — E871 Hypo-osmolality and hyponatremia: Secondary | ICD-10-CM | POA: Diagnosis not present

## 2020-09-23 DIAGNOSIS — I82409 Acute embolism and thrombosis of unspecified deep veins of unspecified lower extremity: Secondary | ICD-10-CM | POA: Diagnosis not present

## 2020-09-23 DIAGNOSIS — F419 Anxiety disorder, unspecified: Secondary | ICD-10-CM | POA: Diagnosis not present

## 2020-09-23 DIAGNOSIS — Z515 Encounter for palliative care: Secondary | ICD-10-CM | POA: Diagnosis not present

## 2020-09-24 ENCOUNTER — Other Ambulatory Visit: Payer: Self-pay

## 2020-09-24 DIAGNOSIS — Z743 Need for continuous supervision: Secondary | ICD-10-CM | POA: Diagnosis not present

## 2020-09-24 DIAGNOSIS — I959 Hypotension, unspecified: Secondary | ICD-10-CM | POA: Diagnosis not present

## 2020-09-24 DIAGNOSIS — D61818 Other pancytopenia: Secondary | ICD-10-CM | POA: Diagnosis not present

## 2020-09-24 DIAGNOSIS — W19XXXA Unspecified fall, initial encounter: Secondary | ICD-10-CM | POA: Diagnosis not present

## 2020-09-24 DIAGNOSIS — D469 Myelodysplastic syndrome, unspecified: Secondary | ICD-10-CM | POA: Diagnosis not present

## 2020-09-24 DIAGNOSIS — N179 Acute kidney failure, unspecified: Secondary | ICD-10-CM | POA: Diagnosis not present

## 2020-09-24 NOTE — Patient Outreach (Signed)
[facility]) Care Management  09/24/2020  [name] May 30, 1940 [id number]   TOC: Referral Date: 09/24/20 Referral Source: [employer] Report Date of Discharge: 09/21/20 Facility:  [facility]: [facility]   Referral received.  No outreach warranted at this time. Patient in hospital.    Plan: RN CM will close case.    Jone Baseman, RN, MSN [facility] Care Management Coordinator Direct Line [phone] Cell [phone] Toll Free: [phone]  Fax: [phone]

## 2020-09-25 DIAGNOSIS — D709 Neutropenia, unspecified: Secondary | ICD-10-CM | POA: Diagnosis not present

## 2020-09-25 DIAGNOSIS — R296 Repeated falls: Secondary | ICD-10-CM | POA: Diagnosis not present

## 2020-09-25 DIAGNOSIS — I130 Hypertensive heart and chronic kidney disease with heart failure and stage 1 through stage 4 chronic kidney disease, or unspecified chronic kidney disease: Secondary | ICD-10-CM | POA: Diagnosis not present

## 2020-09-25 DIAGNOSIS — F064 Anxiety disorder due to known physiological condition: Secondary | ICD-10-CM | POA: Diagnosis not present

## 2020-09-25 DIAGNOSIS — E860 Dehydration: Secondary | ICD-10-CM | POA: Diagnosis not present

## 2020-09-25 DIAGNOSIS — I10 Essential (primary) hypertension: Secondary | ICD-10-CM | POA: Diagnosis not present

## 2020-09-25 DIAGNOSIS — G893 Neoplasm related pain (acute) (chronic): Secondary | ICD-10-CM | POA: Diagnosis not present

## 2020-09-25 DIAGNOSIS — R5381 Other malaise: Secondary | ICD-10-CM | POA: Diagnosis not present

## 2020-09-25 DIAGNOSIS — E119 Type 2 diabetes mellitus without complications: Secondary | ICD-10-CM | POA: Diagnosis not present

## 2020-09-25 DIAGNOSIS — R5383 Other fatigue: Secondary | ICD-10-CM | POA: Diagnosis not present

## 2020-09-25 DIAGNOSIS — D61818 Other pancytopenia: Secondary | ICD-10-CM | POA: Diagnosis not present

## 2020-09-25 DIAGNOSIS — I509 Heart failure, unspecified: Secondary | ICD-10-CM | POA: Diagnosis not present

## 2020-09-25 DIAGNOSIS — D469 Myelodysplastic syndrome, unspecified: Secondary | ICD-10-CM | POA: Diagnosis not present

## 2020-09-25 DIAGNOSIS — F419 Anxiety disorder, unspecified: Secondary | ICD-10-CM | POA: Diagnosis not present

## 2020-09-25 DIAGNOSIS — D649 Anemia, unspecified: Secondary | ICD-10-CM | POA: Diagnosis not present

## 2020-09-25 DIAGNOSIS — R531 Weakness: Secondary | ICD-10-CM | POA: Diagnosis not present

## 2020-09-25 DIAGNOSIS — I4891 Unspecified atrial fibrillation: Secondary | ICD-10-CM | POA: Diagnosis not present

## 2020-09-25 DIAGNOSIS — R32 Unspecified urinary incontinence: Secondary | ICD-10-CM | POA: Diagnosis not present

## 2020-09-27 DIAGNOSIS — E119 Type 2 diabetes mellitus without complications: Secondary | ICD-10-CM | POA: Diagnosis not present

## 2020-09-27 DIAGNOSIS — R5381 Other malaise: Secondary | ICD-10-CM | POA: Diagnosis not present

## 2020-09-27 DIAGNOSIS — D469 Myelodysplastic syndrome, unspecified: Secondary | ICD-10-CM | POA: Diagnosis not present

## 2020-09-27 DIAGNOSIS — I4891 Unspecified atrial fibrillation: Secondary | ICD-10-CM | POA: Diagnosis not present

## 2020-09-27 DIAGNOSIS — D61818 Other pancytopenia: Secondary | ICD-10-CM | POA: Diagnosis not present

## 2020-09-27 DIAGNOSIS — R531 Weakness: Secondary | ICD-10-CM | POA: Diagnosis not present

## 2020-09-28 DIAGNOSIS — F064 Anxiety disorder due to known physiological condition: Secondary | ICD-10-CM | POA: Diagnosis not present

## 2020-09-28 DIAGNOSIS — D469 Myelodysplastic syndrome, unspecified: Secondary | ICD-10-CM | POA: Diagnosis not present

## 2020-09-28 DIAGNOSIS — I10 Essential (primary) hypertension: Secondary | ICD-10-CM | POA: Diagnosis not present

## 2020-09-28 DIAGNOSIS — R531 Weakness: Secondary | ICD-10-CM | POA: Diagnosis not present

## 2020-09-28 DIAGNOSIS — R32 Unspecified urinary incontinence: Secondary | ICD-10-CM | POA: Diagnosis not present

## 2020-09-28 DIAGNOSIS — I130 Hypertensive heart and chronic kidney disease with heart failure and stage 1 through stage 4 chronic kidney disease, or unspecified chronic kidney disease: Secondary | ICD-10-CM | POA: Diagnosis not present

## 2020-09-28 DIAGNOSIS — I4891 Unspecified atrial fibrillation: Secondary | ICD-10-CM | POA: Diagnosis not present

## 2020-09-28 DIAGNOSIS — D709 Neutropenia, unspecified: Secondary | ICD-10-CM | POA: Diagnosis not present

## 2020-09-28 DIAGNOSIS — R5383 Other fatigue: Secondary | ICD-10-CM | POA: Diagnosis not present

## 2020-09-28 DIAGNOSIS — G893 Neoplasm related pain (acute) (chronic): Secondary | ICD-10-CM | POA: Diagnosis not present

## 2020-09-28 DIAGNOSIS — D61818 Other pancytopenia: Secondary | ICD-10-CM | POA: Diagnosis not present

## 2020-09-28 DIAGNOSIS — F419 Anxiety disorder, unspecified: Secondary | ICD-10-CM | POA: Diagnosis not present

## 2020-09-28 DIAGNOSIS — E860 Dehydration: Secondary | ICD-10-CM | POA: Diagnosis not present

## 2020-09-28 DIAGNOSIS — R5381 Other malaise: Secondary | ICD-10-CM | POA: Diagnosis not present

## 2020-09-28 DIAGNOSIS — I509 Heart failure, unspecified: Secondary | ICD-10-CM | POA: Diagnosis not present

## 2020-09-28 DIAGNOSIS — R296 Repeated falls: Secondary | ICD-10-CM | POA: Diagnosis not present

## 2020-10-04 DIAGNOSIS — I10 Essential (primary) hypertension: Secondary | ICD-10-CM | POA: Diagnosis not present

## 2020-10-04 DIAGNOSIS — L89154 Pressure ulcer of sacral region, stage 4: Secondary | ICD-10-CM | POA: Diagnosis not present

## 2020-10-04 DIAGNOSIS — S71102S Unspecified open wound, left thigh, sequela: Secondary | ICD-10-CM | POA: Diagnosis not present

## 2020-10-04 DIAGNOSIS — I509 Heart failure, unspecified: Secondary | ICD-10-CM | POA: Diagnosis not present

## 2020-10-16 DEATH — deceased

## 2021-08-30 NOTE — Telephone Encounter (Signed)
NOTE TO CLOSE ENCOUNTER
